# Patient Record
Sex: Female | Born: 1955 | Race: White | Hispanic: No | Smoking: Former smoker
Health system: Southern US, Community
[De-identification: ages and names within clinical notes are randomized; demographics above are authoritative.]

## PROBLEM LIST (undated history)

## (undated) DIAGNOSIS — Z9289 Personal history of other medical treatment: Secondary | ICD-10-CM

## (undated) DIAGNOSIS — Z9889 Other specified postprocedural states: Secondary | ICD-10-CM

## (undated) DIAGNOSIS — J45909 Unspecified asthma, uncomplicated: Secondary | ICD-10-CM

## (undated) DIAGNOSIS — S83509A Sprain of unspecified cruciate ligament of unspecified knee, initial encounter: Secondary | ICD-10-CM

## (undated) DIAGNOSIS — K5730 Diverticulosis of large intestine without perforation or abscess without bleeding: Secondary | ICD-10-CM

## (undated) DIAGNOSIS — Z0000 Encounter for general adult medical examination without abnormal findings: Secondary | ICD-10-CM

## (undated) DIAGNOSIS — M159 Polyosteoarthritis, unspecified: Secondary | ICD-10-CM

## (undated) DIAGNOSIS — M1990 Unspecified osteoarthritis, unspecified site: Secondary | ICD-10-CM

## (undated) DIAGNOSIS — J449 Chronic obstructive pulmonary disease, unspecified: Secondary | ICD-10-CM

## (undated) DIAGNOSIS — R51 Headache: Secondary | ICD-10-CM

## (undated) DIAGNOSIS — E039 Hypothyroidism, unspecified: Secondary | ICD-10-CM

## (undated) DIAGNOSIS — K219 Gastro-esophageal reflux disease without esophagitis: Secondary | ICD-10-CM

## (undated) DIAGNOSIS — I10 Essential (primary) hypertension: Secondary | ICD-10-CM

## (undated) DIAGNOSIS — R519 Headache, unspecified: Secondary | ICD-10-CM

## (undated) HISTORY — DX: Diverticulosis of large intestine without perforation or abscess without bleeding: K57.30

## (undated) HISTORY — PX: OTHER SURGICAL HISTORY: SHX169

## (undated) HISTORY — DX: Essential (primary) hypertension: I10

## (undated) HISTORY — PX: DIAGNOSTIC LAPAROSCOPY: SUR761

## (undated) HISTORY — PX: DILATION AND CURETTAGE OF UTERUS: SHX78

## (undated) HISTORY — PX: EXCISION MORTON'S NEUROMA: SHX5013

## (undated) HISTORY — PX: TUBAL LIGATION: SHX77

## (undated) HISTORY — PX: OTHER PROCEDURE: U1053

## (undated) HISTORY — DX: Unspecified asthma, uncomplicated: J45.909

## (undated) HISTORY — DX: Polyosteoarthritis, unspecified: M15.9

## (undated) HISTORY — DX: Sprain of unspecified cruciate ligament of unspecified knee, initial encounter: S83.509A

## (undated) HISTORY — DX: Encounter for general adult medical examination without abnormal findings: Z00.00

## (undated) HISTORY — DX: Other specified postprocedural states: Z98.890

## (undated) HISTORY — DX: Personal history of other medical treatment: Z92.89

---

## 2004-05-29 ENCOUNTER — Ambulatory Visit: Payer: Self-pay | Admitting: Orthopedic Surgery

## 2004-06-12 ENCOUNTER — Observation Stay (HOSPITAL_COMMUNITY): Admission: EM | Admit: 2004-06-12 | Discharge: 2004-06-14 | Payer: Self-pay | Admitting: *Deleted

## 2004-06-13 ENCOUNTER — Ambulatory Visit: Payer: Self-pay | Admitting: Cardiology

## 2004-06-20 ENCOUNTER — Ambulatory Visit: Payer: Self-pay | Admitting: *Deleted

## 2004-06-26 ENCOUNTER — Ambulatory Visit: Payer: Self-pay

## 2004-06-26 ENCOUNTER — Ambulatory Visit: Payer: Self-pay | Admitting: *Deleted

## 2007-08-25 ENCOUNTER — Inpatient Hospital Stay: Payer: Self-pay | Admitting: Internal Medicine

## 2007-08-25 ENCOUNTER — Other Ambulatory Visit: Payer: Self-pay

## 2009-02-14 ENCOUNTER — Ambulatory Visit: Payer: Self-pay | Admitting: Oncology

## 2009-03-07 ENCOUNTER — Ambulatory Visit: Payer: Self-pay | Admitting: Oncology

## 2009-03-14 ENCOUNTER — Ambulatory Visit: Payer: Self-pay | Admitting: Oncology

## 2009-03-29 ENCOUNTER — Ambulatory Visit: Payer: Self-pay | Admitting: Orthopedic Surgery

## 2009-04-14 ENCOUNTER — Ambulatory Visit: Payer: Self-pay | Admitting: Oncology

## 2010-07-11 DIAGNOSIS — M940 Chondrocostal junction syndrome [Tietze]: Secondary | ICD-10-CM | POA: Insufficient documentation

## 2010-07-11 DIAGNOSIS — K219 Gastro-esophageal reflux disease without esophagitis: Secondary | ICD-10-CM | POA: Insufficient documentation

## 2010-07-11 DIAGNOSIS — R0789 Other chest pain: Secondary | ICD-10-CM | POA: Insufficient documentation

## 2010-07-11 DIAGNOSIS — N951 Menopausal and female climacteric states: Secondary | ICD-10-CM | POA: Insufficient documentation

## 2010-07-11 DIAGNOSIS — J309 Allergic rhinitis, unspecified: Secondary | ICD-10-CM | POA: Insufficient documentation

## 2010-07-11 DIAGNOSIS — I739 Peripheral vascular disease, unspecified: Secondary | ICD-10-CM | POA: Insufficient documentation

## 2010-08-08 DIAGNOSIS — R946 Abnormal results of thyroid function studies: Secondary | ICD-10-CM | POA: Insufficient documentation

## 2010-09-28 DIAGNOSIS — M7650 Patellar tendinitis, unspecified knee: Secondary | ICD-10-CM | POA: Insufficient documentation

## 2010-09-28 DIAGNOSIS — M25569 Pain in unspecified knee: Secondary | ICD-10-CM | POA: Insufficient documentation

## 2010-09-28 DIAGNOSIS — R229 Localized swelling, mass and lump, unspecified: Secondary | ICD-10-CM | POA: Insufficient documentation

## 2012-01-15 HISTORY — PX: OTHER SURGICAL HISTORY: SHX169

## 2012-11-27 ENCOUNTER — Emergency Department: Payer: Self-pay | Admitting: Emergency Medicine

## 2013-01-30 ENCOUNTER — Emergency Department: Payer: Self-pay | Admitting: Emergency Medicine

## 2013-01-30 LAB — COMPREHENSIVE METABOLIC PANEL
ANION GAP: 3 — AB (ref 7–16)
AST: 38 U/L — AB (ref 15–37)
Albumin: 4 g/dL (ref 3.4–5.0)
Alkaline Phosphatase: 126 U/L — ABNORMAL HIGH
BUN: 13 mg/dL (ref 7–18)
Bilirubin,Total: 0.4 mg/dL (ref 0.2–1.0)
CALCIUM: 9.5 mg/dL (ref 8.5–10.1)
CO2: 30 mmol/L (ref 21–32)
CREATININE: 0.99 mg/dL (ref 0.60–1.30)
Chloride: 101 mmol/L (ref 98–107)
EGFR (African American): >60
Glucose: 93 mg/dL (ref 65–99)
Osmolality: 268 (ref 275–301)
Potassium: 3.8 mmol/L (ref 3.5–5.1)
SGPT (ALT): 32 U/L (ref 12–78)
SODIUM: 134 mmol/L — AB (ref 136–145)
TOTAL PROTEIN: 7.5 g/dL (ref 6.4–8.2)

## 2013-01-30 LAB — TROPONIN I: Troponin-I: <0.02 ng/mL

## 2013-01-30 LAB — AMYLASE: Amylase: 39 U/L (ref 25–115)

## 2013-01-30 LAB — CBC
HCT: 40.7 % (ref 35.0–47.0)
HGB: 14 g/dL (ref 12.0–16.0)
MCH: 32.4 pg (ref 26.0–34.0)
MCHC: 34.3 g/dL (ref 32.0–36.0)
MCV: 94 fL (ref 80–100)
Platelet: 404 10*3/uL (ref 150–440)
RBC: 4.32 10*6/uL (ref 3.80–5.20)
RDW: 13.1 % (ref 11.5–14.5)
WBC: 8.9 10*3/uL (ref 3.6–11.0)

## 2013-01-30 LAB — LIPASE, BLOOD: Lipase: 217 U/L (ref 73–393)

## 2013-01-30 LAB — CK TOTAL AND CKMB (NOT AT ARMC)
CK, TOTAL: 118 U/L (ref 21–215)
CK-MB: 1.4 ng/mL (ref 0.5–3.6)

## 2013-07-12 DIAGNOSIS — K5730 Diverticulosis of large intestine without perforation or abscess without bleeding: Secondary | ICD-10-CM

## 2013-07-12 DIAGNOSIS — E785 Hyperlipidemia, unspecified: Secondary | ICD-10-CM

## 2013-07-12 DIAGNOSIS — J45909 Unspecified asthma, uncomplicated: Secondary | ICD-10-CM

## 2013-07-12 DIAGNOSIS — M159 Polyosteoarthritis, unspecified: Secondary | ICD-10-CM

## 2013-07-22 DIAGNOSIS — N951 Menopausal and female climacteric states: Secondary | ICD-10-CM

## 2013-08-18 ENCOUNTER — Telehealth (INDEPENDENT_AMBULATORY_CARE_PROVIDER_SITE_OTHER): Payer: Self-pay

## 2013-08-18 NOTE — Telephone Encounter (Signed)
Chronic Left Knee Partial ACL tear.    Scheduled 09/01/13 (Wed) 2:30 PM [facility] Dr. Corliss MarcusVoskanian.  Please confirm with pt and remind to bring x-ray and MRI images to appt.

## 2013-08-18 NOTE — Telephone Encounter (Signed)
New patient referral received for Chronic L Knee Pain. I have entered [place] and notes have been scanned into Media. Please review and call the patient with an appointment. If you have any question please call (Physician Access) at ex [phone]. Thank You.

## 2013-08-19 NOTE — Telephone Encounter (Signed)
Rescheduled to 11/03/13 (Wed) 1:30 PM [facility] Dr. Corliss MarcusVoskanian - Per pt's request.

## 2013-08-23 DIAGNOSIS — F329 Major depressive disorder, single episode, unspecified: Secondary | ICD-10-CM

## 2013-08-23 DIAGNOSIS — S83509A Sprain of unspecified cruciate ligament of unspecified knee, initial encounter: Secondary | ICD-10-CM

## 2013-08-23 DIAGNOSIS — F32A Depression, unspecified: Secondary | ICD-10-CM | POA: Insufficient documentation

## 2013-09-01 ENCOUNTER — Encounter (INDEPENDENT_AMBULATORY_CARE_PROVIDER_SITE_OTHER): Payer: BC Managed Care – PPO | Admitting: Internal Medicine

## 2013-10-05 ENCOUNTER — Telehealth (INDEPENDENT_AMBULATORY_CARE_PROVIDER_SITE_OTHER): Payer: Self-pay | Admitting: Internal Medicine

## 2013-10-05 NOTE — Telephone Encounter (Signed)
Received VM of patient c/o UTI s/s. Call placed to patient for triage. LMOM for patient to call back.

## 2013-10-07 ENCOUNTER — Encounter (INDEPENDENT_AMBULATORY_CARE_PROVIDER_SITE_OTHER): Payer: Self-pay | Admitting: Internal Medicine

## 2013-10-07 NOTE — Interdisciplinary (Signed)
Repeat CT chest with contrast came back: Lung nodule stable, patient advised to repeat in 18 months and if stable again, okay to stop repeating it.

## 2013-10-08 NOTE — Telephone Encounter (Signed)
Patient has appointment here on Monday.

## 2013-10-10 ENCOUNTER — Encounter (INDEPENDENT_AMBULATORY_CARE_PROVIDER_SITE_OTHER): Payer: Self-pay | Admitting: Internal Medicine

## 2013-10-10 MED ORDER — TRAMADOL HCL 50 MG OR TABS
ORAL_TABLET | ORAL | Status: DC
Start: 2013-07-12 — End: 2013-12-01

## 2013-10-10 MED ORDER — ATORVASTATIN CALCIUM 20 MG OR TABS
ORAL_TABLET | ORAL | Status: AC
Start: 2013-08-29 — End: ?

## 2013-10-10 MED ORDER — HYDROCODONE-ACETAMINOPHEN 5-325 MG OR TABS
ORAL_TABLET | ORAL | Status: AC
Start: 2013-09-14 — End: ?

## 2013-10-10 MED ORDER — PAROXETINE HCL 20 MG OR TABS
ORAL_TABLET | ORAL | Status: AC
Start: 2013-08-23 — End: ?

## 2013-10-10 MED ORDER — TRAMADOL HCL 50 MG OR TABS
50.00 mg | ORAL_TABLET | Freq: Four times a day (QID) | ORAL | Status: DC | PRN
Start: 2013-09-17 — End: 2013-10-10

## 2013-10-11 ENCOUNTER — Encounter (INDEPENDENT_AMBULATORY_CARE_PROVIDER_SITE_OTHER): Payer: BC Managed Care – PPO | Admitting: Internal Medicine

## 2013-10-11 ENCOUNTER — Ambulatory Visit (INDEPENDENT_AMBULATORY_CARE_PROVIDER_SITE_OTHER): Payer: Self-pay | Admitting: Internal Medicine

## 2013-10-11 NOTE — Progress Notes (Signed)
This encounter was opened in error.  Please disregard.

## 2013-10-28 ENCOUNTER — Encounter (INDEPENDENT_AMBULATORY_CARE_PROVIDER_SITE_OTHER): Payer: Self-pay | Admitting: Internal Medicine

## 2013-10-28 DIAGNOSIS — M25569 Pain in unspecified knee: Principal | ICD-10-CM

## 2013-11-03 ENCOUNTER — Ambulatory Visit (INDEPENDENT_AMBULATORY_CARE_PROVIDER_SITE_OTHER): Payer: BC Managed Care – PPO | Admitting: Internal Medicine

## 2013-11-03 ENCOUNTER — Inpatient Hospital Stay (INDEPENDENT_AMBULATORY_CARE_PROVIDER_SITE_OTHER)
Admit: 2013-11-03 | Discharge: 2013-11-03 | Disposition: A | Discharge status: Home / Self Care | Payer: BC Managed Care – PPO

## 2013-11-03 ENCOUNTER — Encounter (INDEPENDENT_AMBULATORY_CARE_PROVIDER_SITE_OTHER): Payer: Self-pay | Admitting: Internal Medicine

## 2013-11-03 VITALS — BP 157/84 | HR 91 | Temp 98.3°F | Ht 62.0 in | Wt 165.0 lb

## 2013-11-03 DIAGNOSIS — M23307 Other meniscus derangements, unspecified meniscus, left knee: Secondary | ICD-10-CM

## 2013-11-03 DIAGNOSIS — M25562 Pain in left knee: Secondary | ICD-10-CM

## 2013-11-03 DIAGNOSIS — M2242 Chondromalacia patellae, left knee: Principal | ICD-10-CM

## 2013-11-03 DIAGNOSIS — M2352 Chronic instability of knee, left knee: Secondary | ICD-10-CM

## 2013-11-03 NOTE — Progress Notes (Signed)
SPORTS MEDICINE CONSULTATION, ENC    This patient is being seen for a Sports Medicine consultation by the request of Dr. Cyndie Carter, Brenda Carter.    CC: left knee    HPI: Brenda Carter is a 58 year old female that reports intermittent pain and swelling in left knee for years. Fell once during bowling 2477yrs ago. But otherwise no recent injuries. Swims and bowls mostly. Been having symptoms for the last 7636yrs or so that come and go. 2009/10 had a cortisone injection that didn't seem to help much or maybe it did for short period of time, not sure. Taking stemtech supplement recently seems to have resolved her current knee pain. Swimming always helps resolve her joint issues as well, but had been avoiding d/t having pna few times this last year, as well as recent right foot surgery (Dr. Cyril Carter). She points to anterior knee, posterior knee, nonspecific locations. Denies any other joint swelling other than left thumb at times. Got MRI by PMD at [facility], [facility], she didn't bring it with her, was told she has a chronic partial ACL tear.    Pain Score: 6 (when flares up pain level is a 7)    Past Medical History   Diagnosis Date    Diverticulosis of colon      on Colo 2009    Generalized osteoarthrosis, involving multiple sites      has pain in hips, coccyx, knees & left thenar eminence- takes Tramadol 1-2 times a week    Sprain and strain of cruciate ligament of knee      MRI left knee 07/15- partial tear ACL- referred to Ortho    Asthma      uses Breo; uses rescue inhaler only occasionally    History of CT scan of chest      02/15- lung nodules- 5mm LLL, 3mm right minor fissure    H/O coronary angiogram      2012- per patient- showed CAD & PAD- not mentioned in previous MD's Encounter notes & no records of this    Physical exam      07/15    H/O mammogram      2014     Past Surgical History   Procedure Laterality Date    Cataracts       X3- 2012    Tummy tuck       2014    Cesarean section, classic        X3     SOCIAL HISTORY: respiratory therapist, divorced, former smoker, 2 wine/nt, no drugs. Swim and walk.    FAMILY HISTORY:  Family History   Problem Relation Age of Onset    Cancer Brother      died of lung cancer at age 57- nonsmoker    Gastrointestinal Sister      died of Hep C at age 57    Stroke Mother      died at age 57       Meds:  Current outpatient prescriptions: atorvastatin (LIPITOR) 20 MG tablet, , Disp: , Rfl: 1;  HYDROcodone-acetaminophen (NORCO) 5-325 MG tablet, , Disp: , Rfl: 0;  PARoxetine (PAXIL) 20 MG tablet, once a day, Disp: , Rfl: ;  traMADol (ULTRAM) 50 MG tablet, once a day as needed, Disp: , Rfl:     Allergies:  Allergies   Allergen Reactions    Cafergot [Ergotamine-Caffeine] Rash     Body rash    Latex Anaphylaxis  Anaphylaxis reaction        REVIEW OF SYSTEMS: 14 point review of systems is negative other than those stated in the history and intake form    PEX:  BP 157/84 mmHg   Pulse 91   Temp(Src) 98.3 F (36.8 C) (Oral)   Ht 5\' 2"  (1.575 m)   Wt 74.844 kg (165 lb)   BMI 30.17 kg/m2  GENERAL: NAD, comfortable, WDWN  PSYCH: normal affect, alert & oriented  HEENT: atraumatic, EOMI, mucous membrane moist  SKIN: Intact, no rashes or erythema, no open wounds  MSK:  L KNEE  -Inspection: No effusion, no LE edema, no joint erythema or abnormal warmth  -Palpation: +ttp medial jointline mildly, +patellar facets;  Nontender to palpation - pes bursa, lateral jointline, patellar/quad tendon, popliteal fossa, hamstring tendons  -Provocative testing: + patellar grind, neg Mcmurray's, valgus testing neg for laxity and pain, varus testing neg for pain or laxity, +/- Lachmans, + Ant drawer w/ some endpoint, neg Post drawer  -Range of motion: FROM in knee flexion and knee extension  -Strength: 5/5 quads, 5/5 hamstrings, extensor mechanism is intact  -Neurolgoic: Sensation intact to touch lower extremity  -Vascular: Distal pulses intact including DP & PT, skin well perfused  -No  lymphadenopathy, no lymphedema    IMAGING was personally reviewed and interpreted by myself:   - X-Rays films of the knee include weight-bearing merchant, AP, and lateral views. There is normal anatomic alignment, no joint space narrowing, no evidence of fracture, and overall unremarkable per my read.  -MRI left knee [facility] 7.29.15 shows old chronic partial ACL tear, report is o/w neg. Per my read subtle signal in medial meniscus might be subtle tear, also some low grade patellar chondral fissuring, and mild medial/lateral chondral thinning.    ASSESSMENT AND PLAN: [name] with reported h/o intermittent chronic left knee swelling and pain. Her pain is mostly around the kneecap region, some tenderness medial jointline today. She has no swelling, no effusion at this time. Her MRI from [facility] shows and old chronic partial ACL tear that is asymptmatic and wouldn't cause any of these symptoms; no intervention indicated for that at this time. There is trace signal in medial meniscus per my read though not read as full tear. There is some mild patellar chondral irregularity, very mild thinning medial/lateral compartments. 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     At this point her symptoms are resolved. We discussed option for PT, knee bracing for support, consideration of cortisone injection. She declined, stating once she can get back to swimming her symptoms always resolve.    Followup as needed. Return to clinic if symptoms worsen or don't improve.    In future we ask that patient's who get MRI's done outside of [facility] to please bring a copy of their images on CD along w/ aprinted MRI report to their appointment. We don't have access to outside images.      Diagnosis and treatment options were discussed in detail with the patient who is in agreement with the plan. All questions were answered. Thank you very much for this consultation. Please let me know if you have any questions.

## 2013-12-01 ENCOUNTER — Other Ambulatory Visit (INDEPENDENT_AMBULATORY_CARE_PROVIDER_SITE_OTHER): Payer: Self-pay | Admitting: Internal Medicine

## 2013-12-01 DIAGNOSIS — M159 Polyosteoarthritis, unspecified: Principal | ICD-10-CM

## 2013-12-01 MED ORDER — TRAMADOL HCL 50 MG OR TABS
50.0000 mg | ORAL_TABLET | Freq: Every day | ORAL | Status: AC
Start: 2013-12-01 — End: ?

## 2013-12-01 NOTE — Telephone Encounter (Signed)
No further RFs until seen- last seen 08/23/13- no showed for last appt

## 2013-12-01 NOTE — Telephone Encounter (Signed)
Refill request came in via Fax.

## 2013-12-02 NOTE — Telephone Encounter (Signed)
Advised [facility] and left message with patient.

## 2015-01-20 ENCOUNTER — Ambulatory Visit
Admission: RE | Admit: 2015-01-20 | Discharge: 2015-01-20 | Disposition: A | Discharge status: Home / Self Care | Payer: BLUE CROSS/BLUE SHIELD | Source: Ambulatory Visit | Attending: Internal Medicine | Admitting: Internal Medicine

## 2015-01-20 ENCOUNTER — Other Ambulatory Visit: Payer: Self-pay | Admitting: Internal Medicine

## 2015-01-20 DIAGNOSIS — R05 Cough: Secondary | ICD-10-CM

## 2015-01-20 DIAGNOSIS — R059 Cough, unspecified: Secondary | ICD-10-CM

## 2015-03-31 ENCOUNTER — Telehealth (INDEPENDENT_AMBULATORY_CARE_PROVIDER_SITE_OTHER): Payer: Self-pay | Admitting: Internal Medicine

## 2015-03-31 NOTE — Telephone Encounter (Signed)
A reminder pop up came on my task for pt to repeat his Chest CT done 09/2013 ( pls review records in Media ).  Patient last seen in 2015.  pls review.

## 2015-03-31 NOTE — Telephone Encounter (Signed)
lmom asking pt to plz call and let us know if she is under care of another physician and if that doc knows she is needing to do a repeat chest ct or if she still is under carte of dr Cyndie Chimenguyen. If pt is under care of us and is wanting this ct she needs to make an appt to see dr Cyndie Chimenguyen. kimg

## 2015-03-31 NOTE — Telephone Encounter (Signed)
Last seen 08/15- if wants to get f/u chest CT, needs appt (I don't know if she's even my patient any more)

## 2015-07-25 DIAGNOSIS — M19049 Primary osteoarthritis, unspecified hand: Secondary | ICD-10-CM | POA: Insufficient documentation

## 2015-07-25 DIAGNOSIS — M1250 Traumatic arthropathy, unspecified site: Secondary | ICD-10-CM | POA: Insufficient documentation

## 2015-08-02 ENCOUNTER — Other Ambulatory Visit: Payer: Self-pay | Admitting: Specialist

## 2015-08-09 ENCOUNTER — Other Ambulatory Visit: Payer: BLUE CROSS/BLUE SHIELD

## 2015-08-10 ENCOUNTER — Encounter: Payer: Self-pay | Admitting: *Deleted

## 2015-08-10 ENCOUNTER — Encounter
Admission: RE | Admit: 2015-08-10 | Discharge: 2015-08-10 | Disposition: A | Discharge status: Home / Self Care | Payer: BLUE CROSS/BLUE SHIELD | Source: Ambulatory Visit | Attending: Specialist | Admitting: Specialist

## 2015-08-10 NOTE — Patient Instructions (Signed)
  Your procedure is scheduled on: 08-21-15  Report to Same Day Surgery 2nd floor medical mall To find out your arrival time please call [phone] between 1PM - 3PM on 08-18-15   Remember: Instructions that are not followed completely may result in serious medical risk, up to and including death, or upon the discretion of your surgeon and anesthesiologist your surgery may need to be rescheduled.    _x___ 1. Do not eat food or drink liquids after midnight. No gum chewing or hard candies.     __x__ 2. No Alcohol for 24 hours before or after surgery.   __x__3. No Smoking for 24 prior to surgery.   ____  4. Bring all medications with you on the day of surgery if instructed.    __x__ 5. Notify your doctor if there is any change in your medical condition     (cold, fever, infections).     Do not wear jewelry, make-up, hairpins, clips or nail polish.  Do not wear lotions, powders, or perfumes. You may wear deodorant.  Do not shave 48 hours prior to surgery. Men may shave face and neck.  Do not bring valuables to the hospital.    [facility] is not responsible for any belongings or valuables.               Contacts, dentures or bridgework may not be worn into surgery.  Leave your suitcase in the car. After surgery it may be brought to your room.  For patients admitted to the hospital, discharge time is determined by your treatment team.   Patients discharged the day of surgery will not be allowed to drive home.    Please read over the following fact sheets that you were given:   [facility] Preparing for Surgery and or MRSA Information   _x___ Take these medicines the morning of surgery with A SIP OF WATER:    1. THYROID PILL  2. ZYRTEC  3. OMEPRAZOLE  4. TAKE AN EXTRA OMEPRAZOLE ON Sunday NIGHT BEFORE BED  5.  6.  ____ Fleet Enema (as directed)   ____ Use CHG Soap or sage wipes as directed on instruction sheet   _X___ Use inhalers on the day of surgery and bring to hospital day  of surgery  ____ Stop metformin 2 days prior to surgery    ____ Take 1/2 of usual insulin dose the night before surgery and none on the morning of surgery.   ____ Stop aspirin or coumadin, or plavix  _x__ Stop Anti-inflammatories such as Advil, Aleve, Ibuprofen, Motrin, Naproxen,          Naprosyn, Goodies powders or aspirin product AND MOBIC 7 DAYS PRIOR TO SURGERY. Ok to take Tylenol OR TRAMADOL   _X___ Stop supplements until after surgery-STOP BIOTIN, BLACK COHOSH, PUMPKIN SEED OIL, PAPAYA EXTRACT AND MILK THISTLE 7 DAYS PRIOR  ____ Bring C-Pap to the hospital.

## 2015-08-21 ENCOUNTER — Encounter: Payer: Self-pay | Admitting: *Deleted

## 2015-08-21 ENCOUNTER — Ambulatory Visit
Admission: RE | Admit: 2015-08-21 | Discharge: 2015-08-21 | Disposition: A | Discharge status: Home / Self Care | Payer: BLUE CROSS/BLUE SHIELD | Source: Ambulatory Visit | Attending: Specialist | Admitting: Specialist

## 2015-08-21 ENCOUNTER — Ambulatory Visit: Payer: BLUE CROSS/BLUE SHIELD | Admitting: Anesthesiology

## 2015-08-21 ENCOUNTER — Encounter
Admission: RE | Disposition: A | Discharge status: Home / Self Care | Payer: Self-pay | Source: Ambulatory Visit | Attending: Specialist

## 2015-08-21 DIAGNOSIS — K219 Gastro-esophageal reflux disease without esophagitis: Secondary | ICD-10-CM | POA: Insufficient documentation

## 2015-08-21 DIAGNOSIS — Z91040 Latex allergy status: Secondary | ICD-10-CM | POA: Diagnosis not present

## 2015-08-21 DIAGNOSIS — E039 Hypothyroidism, unspecified: Secondary | ICD-10-CM | POA: Insufficient documentation

## 2015-08-21 DIAGNOSIS — Z9889 Other specified postprocedural states: Secondary | ICD-10-CM | POA: Diagnosis not present

## 2015-08-21 DIAGNOSIS — Z87891 Personal history of nicotine dependence: Secondary | ICD-10-CM | POA: Diagnosis not present

## 2015-08-21 DIAGNOSIS — M797 Fibromyalgia: Secondary | ICD-10-CM | POA: Diagnosis not present

## 2015-08-21 DIAGNOSIS — M1812 Unilateral primary osteoarthritis of first carpometacarpal joint, left hand: Secondary | ICD-10-CM | POA: Diagnosis not present

## 2015-08-21 DIAGNOSIS — E7800 Pure hypercholesterolemia, unspecified: Secondary | ICD-10-CM | POA: Insufficient documentation

## 2015-08-21 DIAGNOSIS — Z79899 Other long term (current) drug therapy: Secondary | ICD-10-CM | POA: Diagnosis not present

## 2015-08-21 HISTORY — PX: FINGER ARTHROPLASTY: SHX5017

## 2015-08-21 HISTORY — DX: Unspecified osteoarthritis, unspecified site: M19.90

## 2015-08-21 HISTORY — DX: Unspecified asthma, uncomplicated: J45.909

## 2015-08-21 HISTORY — DX: Headache, unspecified: R51.9

## 2015-08-21 HISTORY — DX: Hypothyroidism, unspecified: E03.9

## 2015-08-21 HISTORY — DX: Gastro-esophageal reflux disease without esophagitis: K21.9

## 2015-08-21 HISTORY — DX: Headache: R51

## 2015-08-21 SURGERY — ARTHROPLASTY, FINGER
Anesthesia: General | Site: Thumb | Laterality: Left | Wound class: Clean

## 2015-08-21 MED ORDER — OXYCODONE HCL 5 MG PO TABS
5.0000 mg | ORAL_TABLET | Freq: Once | ORAL | Status: AC | PRN
  Administered 2015-08-21: 5 mg via ORAL

## 2015-08-21 MED ORDER — OXYCODONE HCL 5 MG/5ML PO SOLN: 5.0000 mg | Freq: Once | ORAL | Status: AC | PRN

## 2015-08-21 MED ORDER — FENTANYL CITRATE (PF) 100 MCG/2ML IJ SOLN
25.0000 ug | INTRAMUSCULAR | Status: AC | PRN
  Administered 2015-08-21 (×6): 25 ug via INTRAVENOUS

## 2015-08-21 MED ORDER — LIDOCAINE HCL (CARDIAC) 20 MG/ML IV SOLN
INTRAVENOUS | Status: DC | PRN
  Administered 2015-08-21: 80 mg via INTRAVENOUS

## 2015-08-21 MED ORDER — FENTANYL CITRATE (PF) 100 MCG/2ML IJ SOLN
INTRAMUSCULAR | Status: DC | PRN
  Administered 2015-08-21 (×2): 50 ug via INTRAVENOUS

## 2015-08-21 MED ORDER — FENTANYL CITRATE (PF) 100 MCG/2ML IJ SOLN
INTRAMUSCULAR | Status: AC
  Administered 2015-08-21: 25 ug via INTRAVENOUS
  Filled 2015-08-21: qty 2

## 2015-08-21 MED ORDER — GABAPENTIN 400 MG PO CAPS: 400.0000 mg | ORAL_CAPSULE | Freq: Three times a day (TID) | ORAL | 3 refills | Status: DC

## 2015-08-21 MED ORDER — MELOXICAM 7.5 MG PO TABS
ORAL_TABLET | ORAL | Status: AC
  Filled 2015-08-21: qty 2

## 2015-08-21 MED ORDER — HYDROCODONE-ACETAMINOPHEN 5-325 MG PO TABS: 1.0000 | ORAL_TABLET | Freq: Four times a day (QID) | ORAL | 0 refills | Status: DC | PRN

## 2015-08-21 MED ORDER — ROCURONIUM BROMIDE 100 MG/10ML IV SOLN
INTRAVENOUS | Status: DC | PRN
  Administered 2015-08-21: 25 mg via INTRAVENOUS

## 2015-08-21 MED ORDER — MELOXICAM 15 MG PO TABS: 15.0000 mg | ORAL_TABLET | Freq: Every day | ORAL | 3 refills | Status: DC

## 2015-08-21 MED ORDER — KETOROLAC TROMETHAMINE 30 MG/ML IJ SOLN
INTRAMUSCULAR | Status: DC | PRN
  Administered 2015-08-21: 30 mg via INTRAVENOUS

## 2015-08-21 MED ORDER — NEOMYCIN-POLYMYXIN B GU 40-200000 IR SOLN
Status: DC | PRN
  Administered 2015-08-21: 2 mL

## 2015-08-21 MED ORDER — GABAPENTIN 300 MG PO CAPS
300.0000 mg | ORAL_CAPSULE | ORAL | Status: AC
  Administered 2015-08-21: 300 mg via ORAL

## 2015-08-21 MED ORDER — LACTATED RINGERS IV SOLN
INTRAVENOUS | Status: DC
  Administered 2015-08-21 (×2): via INTRAVENOUS

## 2015-08-21 MED ORDER — CEFAZOLIN SODIUM-DEXTROSE 2-4 GM/100ML-% IV SOLN
2.0000 g | INTRAVENOUS | Status: AC
  Administered 2015-08-21: 2 g via INTRAVENOUS

## 2015-08-21 MED ORDER — PROPOFOL 10 MG/ML IV BOLUS
INTRAVENOUS | Status: DC | PRN
  Administered 2015-08-21: 50 mg via INTRAVENOUS
  Administered 2015-08-21: 150 mg via INTRAVENOUS

## 2015-08-21 MED ORDER — CHLORHEXIDINE GLUCONATE CLOTH 2 % EX PADS: 6.0000 | MEDICATED_PAD | Freq: Once | CUTANEOUS | Status: DC

## 2015-08-21 MED ORDER — NEOMYCIN-POLYMYXIN B GU 40-200000 IR SOLN
Status: AC
  Filled 2015-08-21: qty 2

## 2015-08-21 MED ORDER — SUGAMMADEX SODIUM 200 MG/2ML IV SOLN
INTRAVENOUS | Status: DC | PRN
  Administered 2015-08-21: 149.6 mg via INTRAVENOUS

## 2015-08-21 MED ORDER — GABAPENTIN 300 MG PO CAPS
ORAL_CAPSULE | ORAL | Status: AC
  Filled 2015-08-21: qty 1

## 2015-08-21 MED ORDER — SUCCINYLCHOLINE CHLORIDE 20 MG/ML IJ SOLN
INTRAMUSCULAR | Status: DC | PRN
  Administered 2015-08-21: 100 mg via INTRAVENOUS

## 2015-08-21 MED ORDER — MELOXICAM 7.5 MG PO TABS
15.0000 mg | ORAL_TABLET | Freq: Once | ORAL | Status: AC
  Administered 2015-08-21: 15 mg via ORAL

## 2015-08-21 MED ORDER — MIDAZOLAM HCL 2 MG/2ML IJ SOLN
INTRAMUSCULAR | Status: DC | PRN
  Administered 2015-08-21: 2 mg via INTRAVENOUS

## 2015-08-21 MED ORDER — BUPIVACAINE HCL (PF) 0.5 % IJ SOLN
INTRAMUSCULAR | Status: DC | PRN
  Administered 2015-08-21: 25 mL

## 2015-08-21 MED ORDER — BUPIVACAINE HCL (PF) 0.5 % IJ SOLN
INTRAMUSCULAR | Status: AC
  Filled 2015-08-21: qty 30

## 2015-08-21 MED ORDER — DEXAMETHASONE SODIUM PHOSPHATE 10 MG/ML IJ SOLN
INTRAMUSCULAR | Status: DC | PRN
  Administered 2015-08-21: 10 mg via INTRAVENOUS

## 2015-08-21 MED ORDER — CLINDAMYCIN PHOSPHATE 600 MG/50ML IV SOLN
600.0000 mg | Freq: Three times a day (TID) | INTRAVENOUS | Status: DC
  Administered 2015-08-21: 600 mg via INTRAVENOUS

## 2015-08-21 MED ORDER — CEFAZOLIN SODIUM-DEXTROSE 2-4 GM/100ML-% IV SOLN
INTRAVENOUS | Status: AC
  Filled 2015-08-21: qty 100

## 2015-08-21 MED ORDER — ONDANSETRON HCL 4 MG/2ML IJ SOLN
INTRAMUSCULAR | Status: DC | PRN
  Administered 2015-08-21: 4 mg via INTRAVENOUS

## 2015-08-21 MED ORDER — OXYCODONE HCL 5 MG PO TABS
ORAL_TABLET | ORAL | Status: AC
  Filled 2015-08-21: qty 1

## 2015-08-21 MED ORDER — CLINDAMYCIN PHOSPHATE 600 MG/50ML IV SOLN
INTRAVENOUS | Status: AC
  Filled 2015-08-21: qty 50

## 2015-08-21 SURGICAL SUPPLY — 46 items
BLADE OSC/SAGITTAL MD 5.5X18 (BLADE) ×2 IMPLANT
BLADE SURG MINI STRL (BLADE) ×2 IMPLANT
BNDG ESMARK 4X12 TAN STRL LF (GAUZE/BANDAGES/DRESSINGS) ×2 IMPLANT
CANISTER SUCT 1200ML W/VALVE (MISCELLANEOUS) ×2 IMPLANT
CHLORAPREP W/TINT 26ML (MISCELLANEOUS) ×2 IMPLANT
CUFF TOURN 18 STER (MISCELLANEOUS) ×2 IMPLANT
DECANTER SPIKE VIAL GLASS SM (MISCELLANEOUS) ×2 IMPLANT
DRAPE FLUOR MINI C-ARM 54X84 (DRAPES) ×2 IMPLANT
ELECT REM PT RETURN 9FT ADLT (ELECTROSURGICAL) ×2
ELECTRODE REM PT RTRN 9FT ADLT (ELECTROSURGICAL) ×1 IMPLANT
GAUZE FLUFF 18X24 1PLY STRL (GAUZE/BANDAGES/DRESSINGS) ×6 IMPLANT
GAUZE PETRO XEROFOAM 1X8 (MISCELLANEOUS) ×2 IMPLANT
GLOVE BIO SURGEON STRL SZ7.5 (GLOVE) ×2 IMPLANT
GLOVE BIO SURGEON STRL SZ8 (GLOVE) ×2 IMPLANT
GOWN STRL REUS W/ TWL LRG LVL3 (GOWN DISPOSABLE) ×2 IMPLANT
GOWN STRL REUS W/TWL LRG LVL3 (GOWN DISPOSABLE) ×4
KIT RM TURNOVER STRD PROC AR (KITS) ×2 IMPLANT
LOOP RED MAXI  1X406MM (MISCELLANEOUS) ×1
LOOP VESSEL MAXI 1X406 RED (MISCELLANEOUS) ×1 IMPLANT
LOOP VESSEL MINI 0.8X406 BLUE (MISCELLANEOUS) ×1 IMPLANT
LOOPS BLUE MINI 0.8X406MM (MISCELLANEOUS) ×1
NEEDLE FILTER BLUNT 18X 1/2SAF (NEEDLE) ×1
NEEDLE FILTER BLUNT 18X1 1/2 (NEEDLE) ×1 IMPLANT
NS IRRIG 500ML POUR BTL (IV SOLUTION) ×2 IMPLANT
PACK EXTREMITY ARMC (MISCELLANEOUS) ×2 IMPLANT
PAD CAST CTTN 4X4 STRL (SOFTGOODS) ×1 IMPLANT
PADDING CAST COTTON 4X4 STRL (SOFTGOODS) ×2
PASSER SUT SWANSON 36MM LOOP (INSTRUMENTS) ×2 IMPLANT
SPLINT CAST 1 STEP 3X12 (MISCELLANEOUS) ×2 IMPLANT
SPONGE LAP 18X18 5 PK (GAUZE/BANDAGES/DRESSINGS) ×2 IMPLANT
STOCKINETTE BIAS CUT 4 980044 (GAUZE/BANDAGES/DRESSINGS) ×2 IMPLANT
STOCKINETTE STRL 4IN 9604848 (GAUZE/BANDAGES/DRESSINGS) ×2 IMPLANT
SUT ETHILON 4-0 (SUTURE) ×2
SUT ETHILON 4-0 FS2 18XMFL BLK (SUTURE) ×1
SUT ETHILON 5-0 FS-2 18 BLK (SUTURE) ×2 IMPLANT
SUT VIC AB 2-0 SH 27 (SUTURE) ×2
SUT VIC AB 2-0 SH 27XBRD (SUTURE) ×1 IMPLANT
SUT VIC AB 3-0 SH 27 (SUTURE) ×2
SUT VIC AB 3-0 SH 27X BRD (SUTURE) ×1 IMPLANT
SUT VIC AB 4-0 FS2 27 (SUTURE) ×2 IMPLANT
SUT VIC AB 4-0 SH 27 (SUTURE) ×2
SUT VIC AB 4-0 SH 27XANBCTRL (SUTURE) ×1 IMPLANT
SUTURE ETHLN 4-0 FS2 18XMF BLK (SUTURE) ×1 IMPLANT
SYR 30ML LL (SYRINGE) ×2 IMPLANT
SYR 5ML LL (SYRINGE) ×2 IMPLANT
WIRE Z .062 C-WIRE SPADE TIP (WIRE) ×2 IMPLANT

## 2015-08-21 NOTE — Anesthesia Preprocedure Evaluation (Signed)
Anesthesia Evaluation  Patient identified by MRN, date of birth, ID band Patient awake    Reviewed: Allergy & Precautions, H&P , NPO status , Patient's Chart, lab work & pertinent test results  History of Anesthesia Complications Negative for: history of anesthetic complications  Airway Mallampati: III  TM Distance: >3 FB Neck ROM: limited    Dental  (+) Poor Dentition, Chipped   Pulmonary asthma , former smoker,    Pulmonary exam normal breath sounds clear to auscultation       Cardiovascular Exercise Tolerance: Good (-) angina(-) Past MI Normal cardiovascular exam Rhythm:regular Rate:Normal     Neuro/Psych  Headaches, negative psych ROS   GI/Hepatic Neg liver ROS, GERD  Controlled,  Endo/Other  Hypothyroidism   Renal/GU negative Renal ROS  negative genitourinary   Musculoskeletal  (+) Arthritis ,   Abdominal   Peds  Hematology negative hematology ROS (+)   Anesthesia Other Findings Past Medical History: No date: Arthritis No date: Asthma No date: GERD (gastroesophageal reflux disease) No date: Headache     Comment: H/O MIGRAINES No date: Hypothyroidism  Past Surgical History: No date: CESAREAN SECTION     Comment: X3 No date: DIAGNOSTIC LAPAROSCOPY No date: DILATION AND CURETTAGE OF UTERUS No date: EXCISION MORTON'S NEUROMA Right No date: TUBAL LIGATION 2014: TUMMY TUCK  BMI    Body Mass Index:  30.18 kg/m      Reproductive/Obstetrics negative OB ROS                             Anesthesia Physical Anesthesia Plan  ASA: III  Anesthesia Plan: General LMA   Post-op Pain Management:    Induction:   Airway Management Planned:   Additional Equipment:   Intra-op Plan:   Post-operative Plan:   Informed Consent: I have reviewed the patients History and Physical, chart, labs and discussed the procedure including the risks, benefits and alternatives for the proposed  anesthesia with the patient or authorized representative who has indicated his/her understanding and acceptance.   Dental Advisory Given  Plan Discussed with: Anesthesiologist, CRNA and Surgeon  Anesthesia Plan Comments:         Anesthesia Quick Evaluation

## 2015-08-21 NOTE — Anesthesia Procedure Notes (Signed)
Procedure Name: LMA Insertion Date/Time: 08/21/2015 2:33 PM Performed by: Pearla DubonnetHUANG, Kemarion Abbey Pre-anesthesia Checklist: Patient identified, Patient being monitored, Timeout performed, Emergency Drugs available and Suction available Patient Re-evaluated:Patient Re-evaluated prior to inductionOxygen Delivery Method: Circle system utilized Preoxygenation: Pre-oxygenation with 100% oxygen Intubation Type: IV induction Ventilation: Mask ventilation without difficulty LMA: LMA inserted LMA Size: 3.0 Tube type: Oral Number of attempts: 1 Placement Confirmation: positive ETCO2 and breath sounds checked- equal and bilateral Tube secured with: Tape Dental Injury: Teeth and Oropharynx as per pre-operative assessment

## 2015-08-21 NOTE — H&P (Signed)
THE PATIENT WAS SEEN PRIOR TO SURGERY TODAY.  HISTORY, ALLERGIES, HOME MEDICATIONS AND OPERATIVE PROCEDURE WERE REVIEWED. RISKS AND BENEFITS OF SURGERY DISCUSSED WITH PATIENT AGAIN.  NO CHANGES FROM INITIAL HISTORY AND PHYSICAL NOTED.    

## 2015-08-21 NOTE — Transfer of Care (Signed)
Immediate Anesthesia Transfer of Care Note  Patient: Evelyn Simon  Procedure(s) Performed: Procedure(s): LEFT THUMB  ARTHROPLASTY (CMC) (Left)  Patient Location: PACU  Anesthesia Type:General  Level of Consciousness: sedated  Airway & Oxygen Therapy: Patient Spontanous Breathing and Patient connected to face mask oxygen  Post-op Assessment: Report given to RN and Post -op Vital signs reviewed and stable  Post vital signs: Reviewed and stable  Last Vitals:  Vitals:   08/21/15 1333  BP: (!) 151/88  Pulse: 87  Resp: 18  Temp: 36.8 C    Last Pain:  Vitals:   08/21/15 1333  TempSrc: Oral         Complications: No apparent anesthesia complications

## 2015-08-21 NOTE — Op Note (Signed)
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    The risks benefits and alternatives were discussed with the patient preoperatively including but not limited to the risks of infection, bleeding, nerve injury, cardiopulmonary complications, the need for revision surgery, among others, and the patient was willing to proceed.  EBL: None  TOURNIQUET TIME: 83 min  OPERATIVE IMPLANTS: None  OPERATIVE FINDINGS: Advanced arthritis left  thumb cmc joint  OPERATIVE PROCEDURE: The patient was brought to the operating room and underwent satisfactory IV regional anesthesia in the supine position. The operative arm was prepped and draped in a sterile fashion. The patient was noted to have no palmaris longus tendon so the flexor carpi radialis was split longitudinally to provide graft material. 3 short transverse incisions were made over the course of the tendon and it was harvested under direct vision. It was then placed in a moist sponge on the back table. An S-shaped incision was then made over the base of the thumb metacarpal dorsally. Dissection was carried out carefully under loupe magnification, carefully sparing the sensory nerves. The tendon sheath around the abductor pollicis longus and extensor pollicis brevis was released under direct vision, including over the radial styloid. Nerves were carefully spared. Retractors were inserted and the capsule of the trapezium and metacarpal were opened longitudinally. The radial artery and veins were  carefully dissected free and retracted with a vessel loop drain. The capsule was dissected off the trapezium and the trapezium was then morselized with the oscillating saw and rongeur. The flexor carpi radialis tendon was freed up in the base of the wound. The palmaris longus tendon was passed beneath this. A 3.5 mm drill was used to create a tunnel through the base of the metacarpal. The tendon was then passed up through the metacarpal using a tendon passer. The thumb had been placed in traction which was then released. The tendon was tied upon itself and the knot was sutured to itself prevent slippage. It was then tied in multiple knots and sutured into a ball. This was then rotated into the space between the metacarpal and the scaphoid. The capsule was then carefully and thoroughly, closed with 4-0 Ethibond suture. After irrigation, the skin was closed with 4-0 nylon. The forearm wounds had been closed with a similar suture. A well-padded thumb spica splint was applied. Tourniquet was deflated with good return of blood flow to the hand. Sponge and needle counts were correct. Patient was taken to recovery in good condition.  Valinda HoarHoward E Masiyah Engen, MD

## 2015-08-21 NOTE — Anesthesia Postprocedure Evaluation (Signed)
Anesthesia Post Note  Patient: Evelyn Simon  Procedure(s) Performed: Procedure(s) (LRB): LEFT THUMB  ARTHROPLASTY (CMC) (Left)  Patient location during evaluation: PACU Anesthesia Type: General Level of consciousness: awake and alert and oriented Pain management: pain level controlled Vital Signs Assessment: post-procedure vital signs reviewed and stable Respiratory status: spontaneous breathing, nonlabored ventilation and respiratory function stable Cardiovascular status: blood pressure returned to baseline and stable Postop Assessment: no signs of nausea or vomiting Anesthetic complications: no    Last Vitals:  Vitals:   08/21/15 1649 08/21/15 1653  BP:  (!) 159/84  Pulse: 81 79  Resp: 14 12  Temp:      Last Pain:  Vitals:   08/21/15 1653  TempSrc:   PainSc: 7                  Lizzett Nobile

## 2015-08-21 NOTE — Anesthesia Procedure Notes (Signed)
Procedure Name: Intubation Date/Time: 08/21/2015 3:07 PM Performed by: Rosaria FerriesPISCITELLO, JOSEPH K Pre-anesthesia Checklist: Patient identified, Patient being monitored, Timeout performed, Emergency Drugs available and Suction available Patient Re-evaluated:Patient Re-evaluated prior to inductionOxygen Delivery Method: Circle system utilized Preoxygenation: Pre-oxygenation with 100% oxygen Intubation Type: IV induction Ventilation: Mask ventilation without difficulty Laryngoscope Size: Mac and 3 Grade View: Grade I Tube type: Oral Tube size: 7.0 mm Number of attempts: 1 Airway Equipment and Method: Stylet Placement Confirmation: ETT inserted through vocal cords under direct vision,  positive ETCO2 and breath sounds checked- equal and bilateral Secured at: 21 cm Tube secured with: Tape Dental Injury: Teeth and Oropharynx as per pre-operative assessment

## 2015-08-22 ENCOUNTER — Encounter: Payer: Self-pay | Admitting: Specialist

## 2015-08-28 ENCOUNTER — Other Ambulatory Visit: Payer: Self-pay | Admitting: Internal Medicine

## 2015-08-28 DIAGNOSIS — R911 Solitary pulmonary nodule: Secondary | ICD-10-CM

## 2015-09-01 ENCOUNTER — Ambulatory Visit
Admission: RE | Admit: 2015-09-01 | Discharge: 2015-09-01 | Disposition: A | Discharge status: Home / Self Care | Payer: BLUE CROSS/BLUE SHIELD | Source: Ambulatory Visit | Attending: Internal Medicine | Admitting: Internal Medicine

## 2015-09-01 ENCOUNTER — Ambulatory Visit: Admission: RE | Admit: 2015-09-01 | Payer: BLUE CROSS/BLUE SHIELD | Source: Ambulatory Visit

## 2015-09-01 DIAGNOSIS — R911 Solitary pulmonary nodule: Secondary | ICD-10-CM | POA: Insufficient documentation

## 2015-09-01 MED ORDER — IOPAMIDOL (ISOVUE-300) INJECTION 61%
75.0000 mL | Freq: Once | INTRAVENOUS | Status: AC | PRN
  Administered 2015-09-01: 75 mL via INTRAVENOUS

## 2015-10-20 ENCOUNTER — Other Ambulatory Visit: Payer: Self-pay | Admitting: Internal Medicine

## 2015-10-20 DIAGNOSIS — R102 Pelvic and perineal pain: Secondary | ICD-10-CM

## 2015-10-23 ENCOUNTER — Telehealth: Payer: Self-pay | Admitting: Gastroenterology

## 2015-10-23 ENCOUNTER — Ambulatory Visit
Admission: RE | Admit: 2015-10-23 | Discharge: 2015-10-23 | Disposition: A | Discharge status: Home / Self Care | Payer: BLUE CROSS/BLUE SHIELD | Source: Ambulatory Visit | Attending: Internal Medicine | Admitting: Internal Medicine

## 2015-10-23 NOTE — Telephone Encounter (Signed)
Left voice message with patient to call and schedule appointment for GERD with Dr. Servando SnareWohl. Referred by Dr. Dario GuardianJadali

## 2015-10-24 NOTE — Telephone Encounter (Signed)
Left voice message for patient to call and schedule appointment with Dr. Servando SnareWohl for GERD. Referred by Dr Dario GuardianJadali

## 2015-10-26 ENCOUNTER — Encounter: Payer: Self-pay | Admitting: Gastroenterology

## 2015-10-26 NOTE — Telephone Encounter (Signed)
Left voice message for patient to call and schedule appointment with Dr. Servando SnareWohl for GERD. Sent letter

## 2015-11-16 DIAGNOSIS — M720 Palmar fascial fibromatosis [Dupuytren]: Secondary | ICD-10-CM | POA: Insufficient documentation

## 2015-12-18 ENCOUNTER — Other Ambulatory Visit: Payer: Self-pay

## 2015-12-19 ENCOUNTER — Ambulatory Visit (INDEPENDENT_AMBULATORY_CARE_PROVIDER_SITE_OTHER): Payer: BLUE CROSS/BLUE SHIELD | Admitting: Gastroenterology

## 2015-12-19 ENCOUNTER — Other Ambulatory Visit
Admission: RE | Admit: 2015-12-19 | Discharge: 2015-12-19 | Disposition: A | Discharge status: Home / Self Care | Payer: BLUE CROSS/BLUE SHIELD | Source: Ambulatory Visit | Attending: Gastroenterology | Admitting: Gastroenterology

## 2015-12-19 ENCOUNTER — Other Ambulatory Visit: Payer: Self-pay

## 2015-12-19 ENCOUNTER — Encounter: Payer: Self-pay | Admitting: Gastroenterology

## 2015-12-19 VITALS — BP 168/80 | HR 77 | Temp 98.2°F | Ht 62.0 in | Wt 167.0 lb

## 2015-12-19 DIAGNOSIS — R140 Abdominal distension (gaseous): Secondary | ICD-10-CM | POA: Insufficient documentation

## 2015-12-19 DIAGNOSIS — R194 Change in bowel habit: Secondary | ICD-10-CM | POA: Insufficient documentation

## 2015-12-19 LAB — TSH: TSH: 3.797 u[IU]/mL (ref 0.350–4.500)

## 2015-12-19 MED ORDER — DOXYCYCLINE HYCLATE 100 MG PO CAPS: 100.0000 mg | ORAL_CAPSULE | Freq: Two times a day (BID) | ORAL | 0 refills | Status: AC

## 2015-12-19 NOTE — Progress Notes (Signed)
Gastroenterology Consultation  Referring Provider:     Sherrie MustacheJadali, Fayegh Primary Care Physician:  Sherrie MustacheFayegh Jadali Primary Gastroenterologist:  Dr. Wyline MoodKiran Zyana Amaro  Reason for Consultation:     GERD        HPI:   Evelyn RankinsRobin Simon is a 60 y.o. y/o female referred for consultation & management  by Dr. Sherrie MustacheFayegh Jadali.    Abdominal pain: Onset: atleats 1 year, it was initially daily, started on omeprazole, helped, later it stopped working, changed to dexilant , abdominal pain has resolved  Site :Epigastrium Radiation: none  Nature of pain: dull in nature Aggravating factors: eating  Relieving factors :dexilant  Weight loss: none  NSAID use: none , stopped some NSAID which she used to take PPI use :yes Gall bladder surgery: no Frequency of bowel movements: several times a day  Change in bowel movements: last 6 months - more frequent  Relief with bowel movements: sometims  Gas/Bloating/Abdominal distension: yes  Feels she "looks pregnant at times", she quit drinking which did not help. No sodas, no crystralite, occasionally consumes chewing , she has had a c section . Foul smelling when she passes gas. Stool smells a lot too , sticky and hard to flush .   She has had her last colonoscopy 10 years back, no EGD.        Past Medical History:  Diagnosis Date  . Arthritis   . Asthma   . GERD (gastroesophageal reflux disease)   . Headache    H/O MIGRAINES  . Hypothyroidism     Past Surgical History:  Procedure Laterality Date  . CESAREAN SECTION     X3  . DIAGNOSTIC LAPAROSCOPY    . DILATION AND CURETTAGE OF UTERUS    . EXCISION MORTON'S NEUROMA Right   . FINGER ARTHROPLASTY Left 08/21/2015   Procedure: LEFT THUMB  ARTHROPLASTY (CMC);  Surgeon: Deeann SaintHoward Miller, MD;  Location: [facility] ORS;  Service: Orthopedics;  Laterality: Left;  . TUBAL LIGATION    . TUMMY TUCK  2014    Prior to Admission medications   Medication Sig Start Date End Date Taking? Authorizing Provider  BIOTIN PO Take 1  tablet by mouth daily.   Yes Historical Provider, MD  BLACK COHOSH PO Take 1 tablet by mouth daily.   Yes Historical Provider, MD  cetirizine (ZYRTEC) 10 MG tablet Take 10 mg by mouth every morning.   Yes Historical Provider, MD  gabapentin (NEURONTIN) 400 MG capsule Take 1 capsule (400 mg total) by mouth 3 (three) times daily. 08/21/15  Yes Deeann SaintHoward Miller, MD  levothyroxine (SYNTHROID, LEVOTHROID) 50 MCG tablet  10/12/15  Yes Historical Provider, MD  milk thistle 175 MG tablet Take 175 mg by mouth daily.   Yes Historical Provider, MD  Misc Natural Products (PUMPKIN SEED OIL) CAPS Take 1 capsule by mouth daily.   Yes Historical Provider, MD  traMADol [name](ULTRAM) 50 MG tablet  10/13/15  Yes Historical Provider, MD  VENTOLIN HFA 108 (90 Base) MCG/ACT inhaler inhale 2 puffs by mouth PRN 07/26/15  Yes Historical Provider, MD  HYDROcodone-acetaminophen (NORCO) 5-325 MG tablet Take 1-2 tablets by mouth every 6 (six) hours as needed. Patient not taking: Reported on 12/19/2015 08/21/15   Deeann SaintHoward Miller, MD  meloxicam (MOBIC) 15 MG tablet Take 15 mg by mouth daily.  07/25/15   Historical Provider, MD  meloxicam (MOBIC) 15 MG tablet Take 1 tablet (15 mg total) by mouth daily. Patient not taking: Reported on 12/19/2015 08/21/15   Deeann SaintHoward Miller, MD  omeprazole [facility](PRILOSEC)  20 MG capsule Take 20 mg by mouth every morning.    Historical Provider, MD  Papaya TABS Take 1 tablet by mouth daily.    Historical Provider, MD  UNKNOWN TO PATIENT PT IS STARTING A THYROID PILL BUT HAS NOT PICKED IT UP FROM HER PHARMACY YET (08-10-15)    Historical Provider, MD    History reviewed. No pertinent family history.   Social History  Substance Use Topics  . Smoking status: Former Smoker    Types: Cigarettes  . Smokeless tobacco: Never Used  . Alcohol use Yes     Comment: RED WINE DAILY    Allergies as of 12/19/2015 - Review Complete 12/19/2015  Allergen Reaction Noted  . Latex Anaphylaxis 08/07/2015  . Crestor [rosuvastatin] Itching  08/10/2015  . Other Hives and Itching 08/10/2015  . Pineapple  08/10/2015  . Soy allergy Hives 08/10/2015  . Ultram [tramadol] Itching 08/10/2015    Review of Systems:    All systems reviewed and negative except where noted in HPI.   Physical Exam:  BP (!) 168/80   Pulse 77   Temp 98.2 F (36.8 C) (Oral)   Ht 5\' 2"  (1.575 m)   Wt 167 lb (75.8 kg)   BMI 30.54 kg/m  No LMP recorded. Patient is postmenopausal. Psych:  Alert and cooperative. Normal mood and affect. General:   Alert,  Well-developed, well-nourished, pleasant and cooperative in NAD Head:  Normocephalic and atraumatic. Eyes:  Sclera clear, no icterus.   Conjunctiva pink. Ears:  Normal auditory acuity. Nose:  No deformity, discharge, or lesions. Mouth:  No deformity or lesions,oropharynx pink & moist. Neck:  Supple; no masses or thyromegaly. Lungs:  Respirations even and unlabored.  Clear throughout to auscultation.   No wheezes, crackles, or rhonchi. No acute distress. Heart:  Regular rate and rhythm; no murmurs, clicks, rubs, or gallops. Abdomen:  Normal bowel sounds.  No bruits.  Soft, non-tender and non-distended without masses, hepatosplenomegaly or hernias noted.  No guarding or rebound tenderness.    Psych:  Alert and cooperative. Normal mood and affect.  Imaging Studies: No results found.  Assessment and Plan:   Evelyn RankinsRobin Simon is a 60 y.o. y/o female is here today for gas ,bloating and change in bowel habits. 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 She alsohas a change in bowel habits.  Plan   1. EGD+colonoscopy  2. Celiac serology ,TSH  Follow up in 8 weeks.   Dr Wyline MoodKiran Ildefonso Keaney MD

## 2015-12-21 ENCOUNTER — Ambulatory Visit: Payer: BLUE CROSS/BLUE SHIELD | Admitting: Anesthesiology

## 2015-12-21 ENCOUNTER — Encounter
Admission: RE | Disposition: A | Discharge status: Home / Self Care | Payer: Self-pay | Source: Ambulatory Visit | Attending: Gastroenterology

## 2015-12-21 ENCOUNTER — Ambulatory Visit
Admission: RE | Admit: 2015-12-21 | Discharge: 2015-12-21 | Disposition: A | Discharge status: Home / Self Care | Payer: BLUE CROSS/BLUE SHIELD | Source: Ambulatory Visit | Attending: Gastroenterology | Admitting: Gastroenterology

## 2015-12-21 ENCOUNTER — Encounter: Payer: Self-pay | Admitting: *Deleted

## 2015-12-21 DIAGNOSIS — Z91018 Allergy to other foods: Secondary | ICD-10-CM | POA: Insufficient documentation

## 2015-12-21 DIAGNOSIS — Z6830 Body mass index (BMI) 30.0-30.9, adult: Secondary | ICD-10-CM | POA: Insufficient documentation

## 2015-12-21 DIAGNOSIS — Z91040 Latex allergy status: Secondary | ICD-10-CM | POA: Diagnosis not present

## 2015-12-21 DIAGNOSIS — E039 Hypothyroidism, unspecified: Secondary | ICD-10-CM | POA: Insufficient documentation

## 2015-12-21 DIAGNOSIS — K219 Gastro-esophageal reflux disease without esophagitis: Secondary | ICD-10-CM | POA: Insufficient documentation

## 2015-12-21 DIAGNOSIS — Z888 Allergy status to other drugs, medicaments and biological substances status: Secondary | ICD-10-CM | POA: Insufficient documentation

## 2015-12-21 DIAGNOSIS — Z87891 Personal history of nicotine dependence: Secondary | ICD-10-CM | POA: Insufficient documentation

## 2015-12-21 DIAGNOSIS — J4520 Mild intermittent asthma, uncomplicated: Secondary | ICD-10-CM | POA: Diagnosis not present

## 2015-12-21 DIAGNOSIS — M1990 Unspecified osteoarthritis, unspecified site: Secondary | ICD-10-CM | POA: Insufficient documentation

## 2015-12-21 DIAGNOSIS — R1013 Epigastric pain: Secondary | ICD-10-CM

## 2015-12-21 DIAGNOSIS — K3189 Other diseases of stomach and duodenum: Secondary | ICD-10-CM | POA: Diagnosis not present

## 2015-12-21 DIAGNOSIS — E669 Obesity, unspecified: Secondary | ICD-10-CM | POA: Diagnosis not present

## 2015-12-21 DIAGNOSIS — K5730 Diverticulosis of large intestine without perforation or abscess without bleeding: Secondary | ICD-10-CM | POA: Diagnosis not present

## 2015-12-21 DIAGNOSIS — Z885 Allergy status to narcotic agent status: Secondary | ICD-10-CM | POA: Insufficient documentation

## 2015-12-21 DIAGNOSIS — R197 Diarrhea, unspecified: Secondary | ICD-10-CM | POA: Diagnosis not present

## 2015-12-21 HISTORY — PX: COLONOSCOPY WITH PROPOFOL: SHX5780

## 2015-12-21 HISTORY — PX: ESOPHAGOGASTRODUODENOSCOPY (EGD) WITH PROPOFOL: SHX5813

## 2015-12-21 LAB — CELIAC DISEASE PANEL
Endomysial Ab, IgA: NEGATIVE
IgA: 157 mg/dL (ref 87–352)
Tissue Transglutaminase Ab, IgA: <2 U/mL (ref 0–3)

## 2015-12-21 SURGERY — COLONOSCOPY WITH PROPOFOL
Anesthesia: General

## 2015-12-21 MED ORDER — PROPOFOL 500 MG/50ML IV EMUL
INTRAVENOUS | Status: DC | PRN
  Administered 2015-12-21: 150 ug/kg/min via INTRAVENOUS

## 2015-12-21 MED ORDER — METOPROLOL TARTRATE 5 MG/5ML IV SOLN
INTRAVENOUS | Status: DC | PRN
  Administered 2015-12-21: 2 mg via INTRAVENOUS

## 2015-12-21 MED ORDER — MIDAZOLAM HCL 2 MG/2ML IJ SOLN
INTRAMUSCULAR | Status: DC | PRN
  Administered 2015-12-21: 2 mg via INTRAVENOUS

## 2015-12-21 MED ORDER — PHENYLEPHRINE HCL 10 MG/ML IJ SOLN
INTRAMUSCULAR | Status: DC | PRN
  Administered 2015-12-21: 200 ug via INTRAVENOUS

## 2015-12-21 MED ORDER — PROPOFOL 10 MG/ML IV BOLUS
INTRAVENOUS | Status: DC | PRN
  Administered 2015-12-21: 60 mg via INTRAVENOUS

## 2015-12-21 MED ORDER — SODIUM CHLORIDE 0.9 % IV SOLN
INTRAVENOUS | Status: DC
Start: 2015-12-21 — End: 2015-12-21
  Administered 2015-12-21 (×2): via INTRAVENOUS

## 2015-12-21 MED ORDER — LIDOCAINE HCL (CARDIAC) 20 MG/ML IV SOLN
INTRAVENOUS | Status: DC | PRN
  Administered 2015-12-21: 60 mg via INTRAVENOUS

## 2015-12-21 NOTE — Anesthesia Postprocedure Evaluation (Signed)
Anesthesia Post Note  Patient: Evelyn Simon  Procedure(s) Performed: Procedure(s) (LRB): COLONOSCOPY WITH PROPOFOL (N/A) ESOPHAGOGASTRODUODENOSCOPY (EGD) WITH PROPOFOL (N/A)  Patient location during evaluation: Endoscopy Anesthesia Type: General Level of consciousness: awake and alert and oriented Pain management: pain level controlled Vital Signs Assessment: post-procedure vital signs reviewed and stable Respiratory status: spontaneous breathing, nonlabored ventilation and respiratory function stable Cardiovascular status: blood pressure returned to baseline and stable Postop Assessment: no signs of nausea or vomiting Anesthetic complications: no    Last Vitals:  Vitals:   12/21/15 0819 12/21/15 0915  BP: (!) 170/83 (!) 169/74  Resp: 18 18  Temp: 36.8 C (!) 35.9 C    Last Pain:  Vitals:   12/21/15 0915  TempSrc: Tympanic                 Audyn Dimercurio

## 2015-12-21 NOTE — Op Note (Signed)
[facility] Gastroenterology Patient Name: Evelyn Simon Procedure Date: 12/21/2015 8:45 AM MRN: [id number] Account #: [id number] Date of Birth: 21-Mar-1955 Admit Type: Outpatient Age: 60 Room: [facility] ENDO ROOM 4 Gender: Female Note Status: Finalized Procedure:            Upper GI endoscopy Indications:          Dyspepsia Providers:            Wyline Mood MD, MD Referring MD:         Sherrie Mustache, MD (Referring MD) Medicines:            Monitored Anesthesia Care Complications:        No immediate complications. Procedure:            Pre-Anesthesia Assessment:                       - Prior to the procedure, a History and Physical was                        performed, and patient medications, allergies and                        sensitivities were reviewed. The patient's tolerance of                        previous anesthesia was reviewed.                       - The risks and benefits of the procedure and the                        sedation options and risks were discussed with the                        patient. All questions were answered and informed                        consent was obtained.                       - The risks and benefits of the procedure and the                        sedation options and risks were discussed with the                        patient. All questions were answered and informed                        consent was obtained.                       - ASA Grade Assessment: II - A patient with mild                        systemic disease.                       After obtaining informed consent, the endoscope was  passed under direct vision. Throughout the procedure,                        the patient's blood pressure, pulse, and oxygen                        saturations were monitored continuously. The Endoscope                        was introduced through the mouth, and advanced to the                        third  part of duodenum. The upper GI endoscopy was                        accomplished with ease. The patient tolerated the                        procedure well. Findings:      The stomach was normal.      The esophagus was normal.      The examined duodenum was normal. Biopsies for histology were taken with       a cold forceps for evaluation of celiac disease. Impression:           - Normal stomach.                       - Normal esophagus.                       - Normal examined duodenum. Biopsied. Recommendation:       - Patient has a contact number available for                        emergencies. The signs and symptoms of potential                        delayed complications were discussed with the patient.                        Return to normal activities tomorrow. Written discharge                        instructions were provided to the patient.                       - Resume previous diet.                       - Continue present medications.                       - Await pathology results.                       - No repeat upper endoscopy.                       - Return to GI office as previously scheduled.                       - Discharge patient to home (with escort). Procedure  Code(s):    --- Professional ---                       [id number], Esophagogastroduodenoscopy, flexible, transoral;                        with biopsy, single or multiple Diagnosis Code(s):    --- Professional ---                       R10.13, Epigastric pain CPT copyright 2016 American Medical Association. All rights reserved. The codes documented in this report are preliminary and upon coder review may  be revised to meet current compliance requirements. Wyline MoodKiran Jurnei Latini, MD Wyline MoodKiran Esperansa Sarabia MD, MD 12/21/2015 8:54:42 AM This report has been signed electronically. Number of Addenda: 0 Note Initiated On: 12/21/2015 8:45 AM      [facility]

## 2015-12-21 NOTE — Op Note (Signed)
[facility] Gastroenterology Patient Name: Evelyn RankinsRobin Simon Procedure Date: 12/21/2015 8:45 AM MRN: [id number] Account #: [id number] Date of Birth: [birth year] Admit Type: Outpatient Age: [age] Room: [facility] ENDO ROOM 4 Gender: Female Note Status: Finalized Procedure:            Colonoscopy Indications:          Diarrhea Providers:            Wyline MoodKiran Yemariam Magar MD, MD Referring MD:         Sherrie MustacheFayegh Jadali, MD (Referring MD) Medicines:            Monitored Anesthesia Care Complications:        No immediate complications. Procedure:            Pre-Anesthesia Assessment:                       - Prior to the procedure, a History and Physical was                        performed, and patient medications, allergies and                        sensitivities were reviewed. The patient's tolerance of                        previous anesthesia was reviewed.                       - The risks and benefits of the procedure and the                        sedation options and risks were discussed with the                        patient. All questions were answered and informed                        consent was obtained.                       - The risks and benefits of the procedure and the                        sedation options and risks were discussed with the                        patient. All questions were answered and informed                        consent was obtained.                       - ASA Grade Assessment: II - A patient with mild                        systemic disease.                       After obtaining informed consent, the colonoscope was                        passed under  direct vision. Throughout the procedure,                        the patient's blood pressure, pulse, and oxygen                        saturations were monitored continuously. The                        Colonoscope was introduced through the anus and                        advanced to the the cecum,  identified by the                        appendiceal orifice, IC valve and transillumination.                        The colonoscopy was performed with ease. The patient                        tolerated the procedure well. The quality of the bowel                        preparation was inadequate. Findings:      The perianal and digital rectal examinations were normal.      Multiple medium-mouthed diverticula were found in the entire colon.      A moderate amount of semi-solid stool was found in the ascending colon,       precluding visualization.      A moderate amount of semi-liquid stool was found in the transverse       colon, precluding visualization.      The area from transverse colon to anus appeared normal. Biopsies for       histology were taken with a cold forceps for evaluation of microscopic       colitis. Impression:           - Preparation of the colon was inadequate.                       - Diverticulosis in the entire examined colon.                       - Stool in the ascending colon.                       - Stool in the transverse colon.                       - No specimens collected. Recommendation:       - Repeat colonoscopy in 6 weeks because the bowel                        preparation was poor.                       - Return to GI office as previously scheduled. Procedure Code(s):    --- Professional ---                       [id number], Colonoscopy, flexible; with biopsy, single or  multiple Diagnosis Code(s):    --- Professional ---                       R19.7, Diarrhea, unspecified                       K57.30, Diverticulosis of large intestine without                        perforation or abscess without bleeding CPT copyright 2016 American Medical Association. All rights reserved. The codes documented in this report are preliminary and upon coder review may  be revised to meet current compliance requirements. [name], MD [name]  MD, MD 12/21/2015 9:15:45 AM This report has been signed electronically. Number of Addenda: 0 Note Initiated On: 12/21/2015 8:45 AM Scope Withdrawal Time: 0 hours 5 minutes 56 seconds  Total Procedure Duration: 0 hours 12 minutes 46 seconds       [facility]

## 2015-12-21 NOTE — Transfer of Care (Signed)
Immediate Anesthesia Transfer of Care Note  Patient: Evelyn Simon  Procedure(s) Performed: Procedure(s): COLONOSCOPY WITH PROPOFOL (N/A) ESOPHAGOGASTRODUODENOSCOPY (EGD) WITH PROPOFOL (N/A)  Patient Location: Endoscopy Unit  Anesthesia Type:General  Level of Consciousness: awake and patient cooperative  Airway & Oxygen Therapy: Patient Spontanous Breathing and Patient connected to nasal cannula oxygen  Post-op Assessment: Report given to RN, Post -op Vital signs reviewed and stable and Patient moving all extremities X 4  Post vital signs: Reviewed and stable  Last Vitals:  Vitals:   12/21/15 0819  BP: (!) 170/83  Resp: 18  Temp: 36.8 C    Last Pain:  Vitals:   12/21/15 0819  TempSrc: Tympanic         Complications: No apparent anesthesia complications

## 2015-12-21 NOTE — Anesthesia Preprocedure Evaluation (Signed)
Anesthesia Evaluation  Patient identified by MRN, date of birth, ID band Patient awake    Reviewed: Allergy & Precautions, NPO status , Patient's Chart, lab work & pertinent test results  History of Anesthesia Complications Negative for: history of anesthetic complications  Airway Mallampati: II  TM Distance: >3 FB Neck ROM: Full    Dental no notable dental hx.    Pulmonary asthma (mild intermittent) , neg sleep apnea, former smoker,    breath sounds clear to auscultation- rhonchi (-) wheezing      Cardiovascular Exercise Tolerance: Good (-) hypertension(-) CAD and (-) Past MI  Rhythm:Regular Rate:Normal - Systolic murmurs and - Diastolic murmurs    Neuro/Psych  Headaches, negative psych ROS   GI/Hepatic Neg liver ROS, GERD  ,  Endo/Other  neg diabetesHypothyroidism   Renal/GU negative Renal ROS     Musculoskeletal  (+) Arthritis ,   Abdominal (+) + obese,   Peds  Hematology negative hematology ROS (+)   Anesthesia Other Findings Past Medical History: No date: Arthritis No date: Asthma No date: GERD (gastroesophageal reflux disease) No date: Headache     Comment: H/O MIGRAINES No date: Hypothyroidism   Reproductive/Obstetrics                             Anesthesia Physical Anesthesia Plan  ASA: II  Anesthesia Plan: General   Post-op Pain Management:    Induction: Intravenous  Airway Management Planned: Natural Airway  Additional Equipment:   Intra-op Plan:   Post-operative Plan:   Informed Consent: I have reviewed the patients History and Physical, chart, labs and discussed the procedure including the risks, benefits and alternatives for the proposed anesthesia with the patient or authorized representative who has indicated his/her understanding and acceptance.   Dental advisory given  Plan Discussed with: CRNA and Anesthesiologist  Anesthesia Plan Comments:          Anesthesia Quick Evaluation

## 2015-12-21 NOTE — H&P (Signed)
Evelyn MoodKiran Hajar Penninger MD [address] [place], [place] [address] Phone: [phone] Fax : [phone]  Primary Care Physician:  Sherrie MustacheFayegh Simon Primary Gastroenterologist:  Dr. Wyline MoodKiran Dameion Simon   Pre-Procedure History & Physical: HPI:  Evelyn Simon is a 60 y.o. female is here for an endoscopy and colonoscopy.   Past Medical History:  Diagnosis Date  . Arthritis   . Asthma   . GERD (gastroesophageal reflux disease)   . Headache    H/O MIGRAINES  . Hypothyroidism     Past Surgical History:  Procedure Laterality Date  . CESAREAN SECTION     X3  . DIAGNOSTIC LAPAROSCOPY    . DILATION AND CURETTAGE OF UTERUS    . EXCISION MORTON'S NEUROMA Right   . FINGER ARTHROPLASTY Left 08/21/2015   Procedure: LEFT THUMB  ARTHROPLASTY (CMC);  Surgeon: Evelyn SaintHoward Miller, MD;  Location: [facility] ORS;  Service: Orthopedics;  Laterality: Left;  . TUBAL LIGATION    . TUMMY TUCK  2014    Prior to Admission medications   Medication Sig Start Date End Date Taking? Authorizing Provider  BIOTIN PO Take 1 tablet by mouth daily.   Yes Historical Provider, MD  BLACK COHOSH PO Take 1 tablet by mouth daily.   Yes Historical Provider, MD  cetirizine (ZYRTEC) 10 MG tablet Take 10 mg by mouth every morning.   Yes Historical Provider, MD  gabapentin (NEURONTIN) 400 MG capsule Take 1 capsule (400 mg total) by mouth 3 (three) times daily. 08/21/15  Yes Evelyn SaintHoward Miller, MD  levothyroxine (SYNTHROID, LEVOTHROID) 50 MCG tablet  10/12/15  Yes Historical Provider, MD  milk thistle 175 MG tablet Take 175 mg by mouth daily.   Yes Historical Provider, MD  Misc Natural Products (PUMPKIN SEED OIL) CAPS Take 1 capsule by mouth daily.   Yes Historical Provider, MD  Papaya TABS Take 1 tablet by mouth daily.   Yes Historical Provider, MD  traMADol Evelyn Sark(ULTRAM) 50 MG tablet  10/13/15  Yes Historical Provider, MD  VENTOLIN HFA 108 (90 Base) MCG/ACT inhaler inhale 2 puffs by mouth PRN 07/26/15  Yes Historical Provider, MD  doxycycline (VIBRAMYCIN) 100  MG capsule Take 1 capsule (100 mg total) by mouth 2 (two) times daily. 12/19/15 12/29/15  Evelyn MoodKiran Evelyn Highland, MD  HYDROcodone-acetaminophen (NORCO) 5-325 MG tablet Take 1-2 tablets by mouth every 6 (six) hours as needed. Patient not taking: Reported on 12/19/2015 08/21/15   Evelyn SaintHoward Miller, MD  meloxicam (MOBIC) 15 MG tablet Take 15 mg by mouth daily.  07/25/15   Historical Provider, MD  meloxicam (MOBIC) 15 MG tablet Take 1 tablet (15 mg total) by mouth daily. Patient not taking: Reported on 12/21/2015 08/21/15   Evelyn SaintHoward Miller, MD  omeprazole (PRILOSEC) 20 MG capsule Take 20 mg by mouth every morning.    Historical Provider, MD  UNKNOWN TO PATIENT PT IS STARTING A THYROID PILL BUT HAS NOT PICKED IT UP FROM HER PHARMACY YET (08-10-15)    Historical Provider, MD    Allergies as of 12/19/2015 - Review Complete 12/19/2015  Allergen Reaction Noted  . Latex Anaphylaxis 08/07/2015  . Crestor [rosuvastatin] Itching 08/10/2015  . Other Hives and Itching 08/10/2015  . Pineapple  08/10/2015  . Soy allergy Hives 08/10/2015  . Ultram [tramadol] Itching 08/10/2015    History reviewed. No pertinent family history.  Social History   Social History  . Marital status: Married    Spouse name: N/A  . Number of children: N/A  . Years of education: N/A   Occupational History  .  Not on file.   Social History Main Topics  . Smoking status: Former Smoker    Types: Cigarettes  . Smokeless tobacco: Never Used  . Alcohol use Yes     Comment: RED WINE DAILY  . Drug use: No  . Sexual activity: Not on file   Other Topics Concern  . Not on file   Social History Narrative  . No narrative on file    Review of Systems: See HPI, otherwise negative ROS  Physical Exam: There were no vitals taken for this visit. General:   Alert,  pleasant and cooperative in NAD Head:  Normocephalic and atraumatic. Neck:  Supple; no masses or thyromegaly. Lungs:  Clear throughout to auscultation.    Heart:  Regular rate and  rhythm. Abdomen:  Soft, nontender and nondistended. Normal bowel sounds, without guarding, and without rebound.   Neurologic:  Alert and  oriented x4;  grossly normal neurologically.  Impression/Plan: Evelyn Simon  Risks, benefits, limitations, and alternatives regarding  endoscopy and colonoscopy have been reviewed with the patient.  Questions have been answered.  All parties agreeable.   Evelyn MoodKiran Eleazar Kimmey, MD  12/21/2015, 8:17 AM

## 2015-12-22 ENCOUNTER — Encounter: Payer: Self-pay | Admitting: Gastroenterology

## 2015-12-22 LAB — SURGICAL PATHOLOGY

## 2015-12-25 ENCOUNTER — Telehealth: Payer: Self-pay | Admitting: Gastroenterology

## 2015-12-25 NOTE — Telephone Encounter (Signed)
Had colonoscopy but still needs a EGD

## 2015-12-27 ENCOUNTER — Other Ambulatory Visit: Payer: Self-pay

## 2015-12-27 NOTE — Telephone Encounter (Signed)
Patient called asking to reschedule her EGD. I asked Ginger to give me a date and patient agreed on [facility]. I also told Ginger to schedule and place orders for patient to have this done.

## 2015-12-28 ENCOUNTER — Telehealth: Payer: Self-pay

## 2015-12-28 NOTE — Telephone Encounter (Signed)
Pt notified of colonoscopy and lab results.

## 2015-12-28 NOTE — Telephone Encounter (Signed)
-----   Message from Wyline MoodKiran Anna, MD sent at 12/27/2015 12:11 PM EST ----- Celiac serology - negative

## 2015-12-28 NOTE — Telephone Encounter (Signed)
-----   Message from Wyline MoodKiran Anna, MD sent at 12/27/2015 12:10 PM EST ----- Normal colon bx and duodenal bx- repeat colonoscopy as suggested in my note due to poor prep

## 2016-01-01 ENCOUNTER — Encounter: Payer: Self-pay | Admitting: *Deleted

## 2016-01-01 MED ORDER — SODIUM CHLORIDE 0.9 % IV SOLN
INTRAVENOUS | Status: DC
Start: 2016-01-01 — End: 2016-01-02
  Administered 2016-01-02: 1000 mL via INTRAVENOUS

## 2016-01-02 ENCOUNTER — Ambulatory Visit: Payer: BLUE CROSS/BLUE SHIELD | Admitting: Anesthesiology

## 2016-01-02 ENCOUNTER — Encounter
Admission: RE | Disposition: A | Discharge status: Home / Self Care | Payer: Self-pay | Source: Ambulatory Visit | Attending: Gastroenterology

## 2016-01-02 ENCOUNTER — Ambulatory Visit
Admission: RE | Admit: 2016-01-02 | Discharge: 2016-01-02 | Disposition: A | Discharge status: Home / Self Care | Payer: BLUE CROSS/BLUE SHIELD | Source: Ambulatory Visit | Attending: Gastroenterology | Admitting: Gastroenterology

## 2016-01-02 DIAGNOSIS — K30 Functional dyspepsia: Secondary | ICD-10-CM | POA: Diagnosis not present

## 2016-01-02 DIAGNOSIS — K228 Other specified diseases of esophagus: Secondary | ICD-10-CM | POA: Diagnosis not present

## 2016-01-02 DIAGNOSIS — Z87891 Personal history of nicotine dependence: Secondary | ICD-10-CM | POA: Insufficient documentation

## 2016-01-02 DIAGNOSIS — Z79899 Other long term (current) drug therapy: Secondary | ICD-10-CM | POA: Insufficient documentation

## 2016-01-02 DIAGNOSIS — E039 Hypothyroidism, unspecified: Secondary | ICD-10-CM | POA: Insufficient documentation

## 2016-01-02 DIAGNOSIS — K219 Gastro-esophageal reflux disease without esophagitis: Secondary | ICD-10-CM | POA: Diagnosis not present

## 2016-01-02 DIAGNOSIS — J45909 Unspecified asthma, uncomplicated: Secondary | ICD-10-CM | POA: Insufficient documentation

## 2016-01-02 DIAGNOSIS — K2970 Gastritis, unspecified, without bleeding: Secondary | ICD-10-CM | POA: Diagnosis not present

## 2016-01-02 HISTORY — PX: ESOPHAGOGASTRODUODENOSCOPY (EGD) WITH PROPOFOL: SHX5813

## 2016-01-02 SURGERY — ESOPHAGOGASTRODUODENOSCOPY (EGD) WITH PROPOFOL
Anesthesia: General

## 2016-01-02 MED ORDER — PROPOFOL 10 MG/ML IV BOLUS
INTRAVENOUS | Status: DC | PRN
Start: 2016-01-02 — End: 2016-01-02
  Administered 2016-01-02: 60 mg via INTRAVENOUS

## 2016-01-02 MED ORDER — LIDOCAINE HCL (PF) 1 % IJ SOLN
2.0000 mL | Freq: Once | INTRAMUSCULAR | Status: AC
  Administered 2016-01-02: 0.3 mL via INTRADERMAL

## 2016-01-02 MED ORDER — GLYCOPYRROLATE 0.2 MG/ML IJ SOLN
INTRAMUSCULAR | Status: DC | PRN
  Administered 2016-01-02: 0.2 mg via INTRAVENOUS

## 2016-01-02 MED ORDER — LIDOCAINE HCL (PF) 1 % IJ SOLN
INTRAMUSCULAR | Status: DC | PRN
  Administered 2016-01-02: 40 mg

## 2016-01-02 MED ORDER — PROPOFOL 500 MG/50ML IV EMUL
INTRAVENOUS | Status: DC | PRN
  Administered 2016-01-02: 175 ug/kg/min via INTRAVENOUS

## 2016-01-02 MED ORDER — MIDAZOLAM HCL 2 MG/2ML IJ SOLN
INTRAMUSCULAR | Status: DC | PRN
  Administered 2016-01-02: 2 mg via INTRAVENOUS

## 2016-01-02 MED ORDER — LIDOCAINE HCL (PF) 1 % IJ SOLN
INTRAMUSCULAR | Status: AC
  Administered 2016-01-02: 0.3 mL via INTRADERMAL
  Filled 2016-01-02: qty 2

## 2016-01-02 NOTE — Anesthesia Preprocedure Evaluation (Signed)
Anesthesia Evaluation  Patient identified by MRN, date of birth, ID band Patient awake    Reviewed: Allergy & Precautions, NPO status , Patient's Chart, lab work & pertinent test results  History of Anesthesia Complications Negative for: history of anesthetic complications  Airway Mallampati: II       Dental   Pulmonary asthma , former smoker,           Cardiovascular negative cardio ROS  + dysrhythmias (PVC's)      Neuro/Psych negative neurological ROS     GI/Hepatic Neg liver ROS, GERD  Medicated and Poorly Controlled,  Endo/Other  Hypothyroidism   Renal/GU negative Renal ROS     Musculoskeletal  (+) Arthritis , Osteoarthritis,    Abdominal   Peds  Hematology negative hematology ROS (+)   Anesthesia Other Findings   Reproductive/Obstetrics                             Anesthesia Physical Anesthesia Plan  ASA: II  Anesthesia Plan: General   Post-op Pain Management:    Induction: Intravenous  Airway Management Planned: Nasal Cannula  Additional Equipment:   Intra-op Plan:   Post-operative Plan:   Informed Consent: I have reviewed the patients History and Physical, chart, labs and discussed the procedure including the risks, benefits and alternatives for the proposed anesthesia with the patient or authorized representative who has indicated his/her understanding and acceptance.     Plan Discussed with:   Anesthesia Plan Comments:         Anesthesia Quick Evaluation

## 2016-01-02 NOTE — H&P (Signed)
Midge Miniumarren Jamilah Jean, MD [facility] [address] [place], [place] [address] Phone: [phone] Fax : [phone]  Primary Care Physician:  Sherrie MustacheFayegh Jadali Primary Gastroenterologist:  Dr. Servando SnareWohl  Pre-Procedure History & Physical: HPI:  Evelyn RankinsRobin Simon is a 60 y.o. female is here for an endoscopy.   Past Medical History:  Diagnosis Date  . Arthritis   . Asthma   . GERD (gastroesophageal reflux disease)   . Headache    H/O MIGRAINES  . Hypothyroidism     Past Surgical History:  Procedure Laterality Date  . CESAREAN SECTION     X3  . COLONOSCOPY WITH PROPOFOL N/A 12/21/2015   Procedure: COLONOSCOPY WITH PROPOFOL;  Surgeon: Wyline MoodKiran Anna, MD;  Location: [facility] ENDOSCOPY;  Service: Endoscopy;  Laterality: N/A;  . DIAGNOSTIC LAPAROSCOPY    . DILATION AND CURETTAGE OF UTERUS    . ESOPHAGOGASTRODUODENOSCOPY (EGD) WITH PROPOFOL N/A 12/21/2015   Procedure: ESOPHAGOGASTRODUODENOSCOPY (EGD) WITH PROPOFOL;  Surgeon: Wyline MoodKiran Anna, MD;  Location: [facility] ENDOSCOPY;  Service: Endoscopy;  Laterality: N/A;  . EXCISION MORTON'S NEUROMA Right   . FINGER ARTHROPLASTY Left 08/21/2015   Procedure: LEFT THUMB  ARTHROPLASTY (CMC);  Surgeon: Deeann SaintHoward Miller, MD;  Location: [facility] ORS;  Service: Orthopedics;  Laterality: Left;  . TUBAL LIGATION    . TUMMY TUCK  2014    Prior to Admission medications   Medication Sig Start Date End Date Taking? Authorizing Provider  BIOTIN PO Take 1 tablet by mouth daily.    Historical Provider, MD  BLACK COHOSH PO Take 1 tablet by mouth daily.    Historical Provider, MD  cetirizine (ZYRTEC) 10 MG tablet Take 10 mg by mouth every morning.    Historical Provider, MD  gabapentin (NEURONTIN) 400 MG capsule Take 1 capsule (400 mg total) by mouth 3 (three) times daily. 08/21/15   Deeann SaintHoward Miller, MD  HYDROcodone-acetaminophen (NORCO) 5-325 MG tablet Take 1-2 tablets by mouth every 6 (six) hours as needed. Patient not taking: Reported on 12/19/2015 08/21/15   Deeann SaintHoward Miller, MD  levothyroxine  (SYNTHROID, LEVOTHROID) 50 MCG tablet  10/12/15   Historical Provider, MD  milk thistle 175 MG tablet Take 175 mg by mouth daily.    Historical Provider, MD  Misc Natural Products (PUMPKIN SEED OIL) CAPS Take 1 capsule by mouth daily.    Historical Provider, MD  omeprazole (PRILOSEC) 20 MG capsule Take 20 mg by mouth every morning.    Historical Provider, MD  Papaya TABS Take 1 tablet by mouth daily.    Historical Provider, MD  traMADol [name](ULTRAM) 50 MG tablet  10/13/15   Historical Provider, MD  UNKNOWN TO PATIENT PT IS STARTING A THYROID PILL BUT HAS NOT PICKED IT UP FROM HER PHARMACY YET (08-10-15)    Historical Provider, MD  VENTOLIN HFA 108 (90 Base) MCG/ACT inhaler inhale 2 puffs by mouth PRN 07/26/15   Historical Provider, MD    Allergies as of 12/27/2015 - Review Complete 12/21/2015  Allergen Reaction Noted  . Latex Anaphylaxis 08/07/2015  . Crestor [rosuvastatin] Itching 08/10/2015  . Other Hives and Itching 08/10/2015  . Pineapple  08/10/2015  . Soy allergy Hives 08/10/2015  . Ultram [tramadol] Itching 08/10/2015    History reviewed. No pertinent family history.  Social History   Social History  . Marital status: Married    Spouse name: N/A  . Number of children: N/A  . Years of education: N/A   Occupational History  . Not on file.   Social History Main Topics  . Smoking status: Former  Smoker    Types: Cigarettes  . Smokeless tobacco: Never Used  . Alcohol use Yes     Comment: RED WINE DAILY  . Drug use: No  . Sexual activity: Not on file   Other Topics Concern  . Not on file   Social History Narrative  . No narrative on file    Review of Systems: See HPI, otherwise negative ROS  Physical Exam: BP 126/73   Pulse 86   Temp 97.6 F (36.4 C) (Tympanic)   Resp 15   Ht 5\' 2"  (1.575 m)   Wt 162 lb (73.5 kg)   SpO2 99%   BMI 29.63 kg/m  General:   Alert,  pleasant and cooperative in NAD Head:  Normocephalic and atraumatic. Neck:  Supple; no masses or  thyromegaly. Lungs:  Clear throughout to auscultation.    Heart:  Regular rate and rhythm. Abdomen:  Soft, nontender and nondistended. Normal bowel sounds, without guarding, and without rebound.   Neurologic:  Alert and  oriented x4;  grossly normal neurologically.  Impression/Plan: Evelyn RankinsRobin Hsiao is here for an endoscopy to be performed for dyspepsia and gerd  Risks, benefits, limitations, and alternatives regarding  endoscopy have been reviewed with the patient.  Questions have been answered.  All parties agreeable.   Midge Miniumarren Alton Bouknight, MD  01/02/2016, 10:30 AM

## 2016-01-02 NOTE — Anesthesia Procedure Notes (Signed)
Performed by: Jacarra Bobak Pre-anesthesia Checklist: Patient identified, Emergency Drugs available, Suction available, Patient being monitored and Timeout performed Patient Re-evaluated:Patient Re-evaluated prior to inductionOxygen Delivery Method: Nasal cannula Preoxygenation: Pre-oxygenation with 100% oxygen Intubation Type: IV induction       

## 2016-01-02 NOTE — Anesthesia Postprocedure Evaluation (Signed)
Anesthesia Post Note  Patient: Evelyn Simon  Procedure(s) Performed: Procedure(s) (LRB): ESOPHAGOGASTRODUODENOSCOPY (EGD) WITH PROPOFOL (N/A)  Patient location during evaluation: Endoscopy Anesthesia Type: General Level of consciousness: awake and alert Pain management: pain level controlled Vital Signs Assessment: post-procedure vital signs reviewed and stable Respiratory status: spontaneous breathing and respiratory function stable Cardiovascular status: stable Anesthetic complications: no     Last Vitals:  Vitals:   01/02/16 0930 01/02/16 1025  BP: (!) 172/74 126/73  Pulse: 76 86  Resp: 17 15  Temp: 36.4 C     Last Pain:  Vitals:   01/02/16 0930  TempSrc: Tympanic                 KEPHART,WILLIAM K

## 2016-01-02 NOTE — Op Note (Signed)
[facility] G[facility]enter[facility] Patient Name: Evelyn RankinsRobin Podolak Procedure Date: 01/02/2016 10:10 AM MRN: [id number] Account #: [id number] Date of Birth: [birth year] Admit Type: Outpatient Age: [age] [facility] Gender: Female Note Status: Finalized Procedure:            Upper GI endoscopy Indications:          Functional Dyspepsia, Heartburn Providers:            Midge Miniumarren Maely Clements MD, MD Referring MD:         Sherrie MustacheFayegh Jadali, MD (Referring MD) Medicines:            Propofol per Anesthesia Complications:        No immediate complications. Procedure:            Pre-Anesthesia Assessment:                       - Prior to the procedure, a History and Physical was                        performed, and patient medications and allergies were                        reviewed. The patient's tolerance of previous                        anesthesia was also reviewed. The risks and benefits of                        the procedure and the sedation options and risks were                        discussed with the patient. All questions were                        answered, and informed consent was obtained. Prior                        Anticoagulants: The patient has taken no previous                        anticoagulant or antiplatelet agents. ASA Grade                        Assessment: II - A patient with mild systemic disease.                        After reviewing the risks and benefits, the patient was                        deemed in satisfactory condition to undergo the                        procedure.                       After obtaining informed consent, the endoscope was                        passed under direct vision. Throughout the procedure,  the patient's blood pressure, pulse, and oxygen                        saturations were monitored continuously. The                        Colonoscope was introduced through the mouth, and      advanced to the second part of duodenum. The upper GI                        endoscopy was accomplished without difficulty. The                        patient tolerated the procedure well. Findings:      The Z-line was irregular and was found at the gastroesophageal junction.      Localized mild inflammation characterized by erythema was found in the       gastric antrum. Biopsies were taken with a cold forceps for histology.      The examined duodenum was normal. Biopsies were taken with a cold       forceps for histology. Impression:           - Z-line irregular, at the gastroesophageal junction.                       - Gastritis. Biopsied.                       - Normal examined duodenum. Biopsied. Recommendation:       - Discharge patient to home.                       - Resume previous diet.                       - Continue present medications.                       - Await pathology results.                       - Perform a colonoscopy at appointment to be scheduled. Procedure Code(s):    --- Professional ---                       [phone], Esophagogastroduodenoscopy, flexible, transoral;                        with biopsy, single or multiple Diagnosis Code(s):    --- Professional ---                       K30, Functional dyspepsia                       R12, Heartburn                       K29.70, Gastritis, unspecified, without bleeding                       K22.8, Other specified diseases of esophagus CPT copyright 2016 American Medical Association. All rights reserved. The codes documented in this report are preliminary and upon coder review may  be  revised to meet current compliance requirements. Midge Minium MD, MD 01/02/2016 10:24:05 AM This report has been signed electronically. Number of Addenda: 0 Note Initiated On: 01/02/2016 10:10 AM      [facility]

## 2016-01-02 NOTE — Transfer of Care (Signed)
Immediate Anesthesia Transfer of Care Note  Patient: Evelyn Simon  Procedure(s) Performed: Procedure(s): ESOPHAGOGASTRODUODENOSCOPY (EGD) WITH PROPOFOL (N/A)  Patient Location: PACU  Anesthesia Type:General  Level of Consciousness: awake, alert  and responds to stimulation  Airway & Oxygen Therapy: Patient Spontanous Breathing and Patient connected to nasal cannula oxygen  Post-op Assessment: Report given to RN and Post -op Vital signs reviewed and stable  Post vital signs: Reviewed and stable  Last Vitals:  Vitals:   01/02/16 0930 01/02/16 1025  BP: (!) 172/74 126/73  Pulse: 76 86  Resp: 17 15  Temp: 36.4 C     Last Pain:  Vitals:   01/02/16 0930  TempSrc: Tympanic         Complications: No apparent anesthesia complications

## 2016-01-03 ENCOUNTER — Telehealth: Payer: Self-pay | Admitting: Gastroenterology

## 2016-01-03 ENCOUNTER — Encounter: Payer: Self-pay | Admitting: Gastroenterology

## 2016-01-03 ENCOUNTER — Other Ambulatory Visit: Payer: Self-pay

## 2016-01-03 LAB — SURGICAL PATHOLOGY

## 2016-01-03 MED ORDER — DOXYCYCLINE HYCLATE 100 MG PO CAPS: 100.0000 mg | ORAL_CAPSULE | Freq: Two times a day (BID) | ORAL | 0 refills | Status: DC

## 2016-01-03 NOTE — Telephone Encounter (Signed)
Doxycycline 100 mg BID for 10 days, ensure not allergic

## 2016-01-03 NOTE — Telephone Encounter (Signed)
Dr. Servando SnareWohl done this pt's EGD yesterday and on your last ov note you were going to prescribe something for bacteria overgrowth. Please let me know what I can send.

## 2016-01-03 NOTE — Telephone Encounter (Signed)
Evelyn Simon from Endo called and stated that the patient said Dr. Servando SnareWohl was suppose to call her in an antibiotic. Her procedure was yesterday. Please call

## 2016-01-03 NOTE — Telephone Encounter (Signed)
Contacted pt and [facility] [address]

## 2016-01-09 ENCOUNTER — Encounter: Payer: Self-pay | Admitting: Gastroenterology

## 2016-01-12 ENCOUNTER — Telehealth: Payer: Self-pay | Admitting: Gastroenterology

## 2016-01-12 NOTE — Telephone Encounter (Signed)
results

## 2016-01-16 NOTE — Telephone Encounter (Signed)
Results letter mailed

## 2016-04-19 DIAGNOSIS — R002 Palpitations: Secondary | ICD-10-CM | POA: Insufficient documentation

## 2016-04-19 DIAGNOSIS — I10 Essential (primary) hypertension: Secondary | ICD-10-CM | POA: Insufficient documentation

## 2016-05-21 DIAGNOSIS — I2510 Atherosclerotic heart disease of native coronary artery without angina pectoris: Secondary | ICD-10-CM | POA: Insufficient documentation

## 2016-06-17 DIAGNOSIS — M65312 Trigger thumb, left thumb: Secondary | ICD-10-CM | POA: Insufficient documentation

## 2016-06-20 DIAGNOSIS — E039 Hypothyroidism, unspecified: Secondary | ICD-10-CM | POA: Insufficient documentation

## 2016-07-24 ENCOUNTER — Ambulatory Visit: Payer: Self-pay | Attending: Oncology | Admitting: *Deleted

## 2016-07-24 ENCOUNTER — Ambulatory Visit
Admission: RE | Admit: 2016-07-24 | Discharge: 2016-07-24 | Disposition: A | Discharge status: Home / Self Care | Payer: Self-pay | Source: Ambulatory Visit | Attending: Oncology | Admitting: Oncology

## 2016-07-24 VITALS — BP 176/119 | HR 78 | Temp 98.0°F | Resp 18 | Ht 62.0 in | Wt 167.0 lb

## 2016-07-24 DIAGNOSIS — Z0000 Encounter for general adult medical examination without abnormal findings: Secondary | ICD-10-CM

## 2016-07-24 NOTE — Patient Instructions (Signed)
Breast Tenderness Breast tenderness is a common problem for women of all ages. Breast tenderness may cause mild discomfort to severe pain. The pain usually comes and goes in association with your menstrual cycle, but it can be constant. Breast tenderness has many possible causes, including hormone changes and some medicines. Your health care provider may order tests, such as a mammogram or an ultrasound, to check for any unusual findings. Having breast tenderness usually does not mean that you have breast cancer. Follow these instructions at home: Sometimes, reassurance that you do not have breast cancer is all that is needed. In general, follow these home care instructions: Managing pain and discomfort  If directed, apply ice to the area: ? Put ice in a plastic bag. ? Place a towel between your skin and the bag. ? Leave the ice on for 20 minutes, 2-3 times a day.  Make sure you are wearing a supportive bra, especially during exercise. You may also want to wear a supportive bra while sleeping if your breasts are very tender. Medicines  Take over-the-counter and prescription medicines only as told by your health care provider. If the cause of your pain is infection, you may be prescribed an antibiotic medicine.  If you were prescribed an antibiotic, take it as told by your health care provider. Do not stop taking the antibiotic even if you start to feel better. General instructions  Your health care provider may recommend that you reduce the amount of fat in your diet. You can do this by: ? Limiting fried foods. ? Cooking foods using methods, such as baking, boiling, grilling, and broiling.  Decrease the amount of caffeine in your diet. You can do this by drinking more water and choosing caffeine-free options.  Keep a log of the days and times when your breasts are most tender.  Ask your health care provider how to do breast exams at home. This will help you notice if you have an unusual  growth or lump. Contact a health care provider if:  Any part of your breast is hard, red, and hot to the touch. This may be a sign of infection.  You are not breastfeeding and you have fluid, especially blood or pus, coming out of your nipples.  You have a fever.  You have a new or painful lump in your breast that remains after your menstrual period ends.  Your pain does not improve or it gets worse.  Your pain is interfering with your daily activities. This information is not intended to replace advice given to you by your health care provider. Make sure you discuss any questions you have with your health care provider. Document Released: 12/14/2007 Document Revised: 09/29/2015 Document Reviewed: 09/29/2015 Elsevier Interactive Patient Education  2018 [employer]. Hypertension Hypertension, commonly called high blood pressure, is when the force of blood pumping through the arteries is too strong. The arteries are the blood vessels that carry blood from the heart throughout the body. Hypertension forces the heart to work harder to pump blood and may cause arteries to become narrow or stiff. Having untreated or uncontrolled hypertension can cause heart attacks, strokes, kidney disease, and other problems. A blood pressure reading consists of a higher number over a lower number. Ideally, your blood pressure should be below 120/80. The first ("top") number is called the systolic pressure. It is a measure of the pressure in your arteries as your heart beats. The second ("bottom") number is called the diastolic pressure. It is a measure  of the pressure in your arteries as the heart relaxes. What are the causes? The cause of this condition is not known. What increases the risk? Some risk factors for high blood pressure are under your control. Others are not. Factors you can change  Smoking.  Having type 2 diabetes mellitus, high cholesterol, or both.  Not getting enough exercise or physical  activity.  Being overweight.  Having too much fat, sugar, calories, or salt (sodium) in your diet.  Drinking too much alcohol. Factors that are difficult or impossible to change  Having chronic kidney disease.  Having a family history of high blood pressure.  Age. Risk increases with age.  Race. You may be at higher risk if you are African-American.  Gender. Men are at higher risk than women before age 4. After age 24, women are at higher risk than men.  Having obstructive sleep apnea.  Stress. What are the signs or symptoms? Extremely high blood pressure (hypertensive crisis) may cause:  Headache.  Anxiety.  Shortness of breath.  Nosebleed.  Nausea and vomiting.  Severe chest pain.  Jerky movements you cannot control (seizures).  How is this diagnosed? This condition is diagnosed by measuring your blood pressure while you are seated, with your arm resting on a surface. The cuff of the blood pressure monitor will be placed directly against the skin of your upper arm at the level of your heart. It should be measured at least twice using the same arm. Certain conditions can cause a difference in blood pressure between your right and left arms. Certain factors can cause blood pressure readings to be lower or higher than normal (elevated) for a short period of time:  When your blood pressure is higher when you are in a health care provider's office than when you are at home, this is called white coat hypertension. Most people with this condition do not need medicines.  When your blood pressure is higher at home than when you are in a health care provider's office, this is called masked hypertension. Most people with this condition may need medicines to control blood pressure.  If you have a high blood pressure reading during one visit or you have normal blood pressure with other risk factors:  You may be asked to return on a different day to have your blood pressure  checked again.  You may be asked to monitor your blood pressure at home for 1 week or longer.  If you are diagnosed with hypertension, you may have other blood or imaging tests to help your health care provider understand your overall risk for other conditions. How is this treated? This condition is treated by making healthy lifestyle changes, such as eating healthy foods, exercising more, and reducing your alcohol intake. Your health care provider may prescribe medicine if lifestyle changes are not enough to get your blood pressure under control, and if:  Your systolic blood pressure is above 130.  Your diastolic blood pressure is above 80.  Your personal target blood pressure may vary depending on your medical conditions, your age, and other factors. Follow these instructions at home: Eating and drinking  Eat a diet that is high in fiber and potassium, and low in sodium, added sugar, and fat. An example eating plan is called the DASH (Dietary Approaches to Stop Hypertension) diet. To eat this way: ? Eat plenty of fresh fruits and vegetables. Try to fill half of your plate at each meal with fruits and vegetables. ? Eat whole  grains, such as whole wheat pasta, brown rice, or whole grain bread. Fill about one quarter of your plate with whole grains. ? Eat or drink low-fat dairy products, such as skim milk or low-fat yogurt. ? Avoid fatty cuts of meat, processed or cured meats, and poultry with skin. Fill about one quarter of your plate with lean proteins, such as fish, chicken without skin, beans, eggs, and tofu. ? Avoid premade and processed foods. These tend to be higher in sodium, added sugar, and fat.  Reduce your daily sodium intake. Most people with hypertension should eat less than 1,500 mg of sodium a day.  Limit alcohol intake to no more than 1 drink a day for nonpregnant women and 2 drinks a day for men. One drink equals 12 oz of beer, 5 oz of wine, or 1 oz of hard  liquor. Lifestyle  Work with your health care provider to maintain a healthy body weight or to lose weight. Ask what an ideal weight is for you.  Get at least 30 minutes of exercise that causes your heart to beat faster (aerobic exercise) most days of the week. Activities may include walking, swimming, or biking.  Include exercise to strengthen your muscles (resistance exercise), such as pilates or lifting weights, as part of your weekly exercise routine. Try to do these types of exercises for 30 minutes at least 3 days a week.  Do not use any products that contain nicotine or tobacco, such as cigarettes and e-cigarettes. If you need help quitting, ask your health care provider.  Monitor your blood pressure at home as told by your health care provider.  Keep all follow-up visits as told by your health care provider. This is important. Medicines  Take over-the-counter and prescription medicines only as told by your health care provider. Follow directions carefully. Blood pressure medicines must be taken as prescribed.  Do not skip doses of blood pressure medicine. Doing this puts you at risk for problems and can make the medicine less effective.  Ask your health care provider about side effects or reactions to medicines that you should watch for. Contact a health care provider if:  You think you are having a reaction to a medicine you are taking.  You have headaches that keep coming back (recurring).  You feel dizzy.  You have swelling in your ankles.  You have trouble with your vision. Get help right away if:  You develop a severe headache or confusion.  You have unusual weakness or numbness.  You feel faint.  You have severe pain in your chest or abdomen.  You vomit repeatedly.  You have trouble breathing. Summary  Hypertension is when the force of blood pumping through your arteries is too strong. If this condition is not controlled, it may put you at risk for serious  complications.  Your personal target blood pressure may vary depending on your medical conditions, your age, and other factors. For most people, a normal blood pressure is less than 120/80.  Hypertension is treated with lifestyle changes, medicines, or a combination of both. Lifestyle changes include weight loss, eating a healthy, low-sodium diet, exercising more, and limiting alcohol. This information is not intended to replace advice given to you by your health care provider. Make sure you discuss any questions you have with your health care provider. Document Released: 12/31/2004 Document Revised: 11/29/2015 Document Reviewed: 11/29/2015 Elsevier Interactive Patient Education  2018 [employer] HPV Test The human papillomavirus (HPV) test is used to look for  high-risk types of HPV infection. HPV is a group of about 100 viruses. Many of these viruses cause growths on, in, or around the genitals. Most HPV viruses cause infections that usually go away without treatment. However, HPV types 6, 11, 16, and 18 are considered high-risk types of HPV that can increase your risk of cancer of the cervix or anus if the infection is left untreated. An HPV test identifies the DNA (genetic) strands of the HPV infection, so it is also referred to as the HPV DNA test. Although HPV is found in both males and females, the HPV test is only used to screen for increased cancer risk in females:  With an abnormal Pap test.  After treatment of an abnormal Pap test.  Between the ages of 88 and 39.  After treatment of a high-risk HPV infection.  The HPV test may be done at the same time as a pelvic exam and Pap test in females over the age of 53. Both the HPV test and Pap test require a sample of cells from the cervix. How do I prepare for this test?  Do not douche or take a bath for 24-48 hours before the test or as directed by your health care provider.  Do not have sex for 24-48 hours before the test or as  directed by your health care provider.  You may be asked to reschedule the test if you are menstruating.  You will be asked to urinate before the test. What do the results mean? It is your responsibility to obtain your test results. Ask the lab or department performing the test when and how you will get your results. Talk with your health care provider if you have any questions about your results. Your result will be negative or positive. Meaning of Negative Test Results A negative HPV test result means that no HPV was found, and it is very likely that you do not have HPV. Meaning of Positive Test Results A positive HPV test result indicates that you have HPV.  If your test result shows the presence of any high-risk HPV strains, you may have an increased risk of developing cancer of the cervix or anus if the infection is left untreated.  If any low-risk HPV strains are found, you are not likely to have an increased risk of cancer.  Discuss your test results with your health care provider. He or she will use the results to make a diagnosis and determine a treatment plan that is right for you. Talk with your health care provider to discuss your results, treatment options, and if necessary, the need for more tests. Talk with your health care provider if you have any questions about your results. This information is not intended to replace advice given to you by your health care provider. Make sure you discuss any questions you have with your health care provider. Document Released: 01/26/2004 Document Revised: 09/06/2015 Document Reviewed: 05/18/2013 Elsevier Interactive Patient Education  2017 [employer].

## 2016-07-25 ENCOUNTER — Encounter: Payer: Self-pay | Admitting: *Deleted

## 2016-07-25 NOTE — Progress Notes (Signed)
Subjective:     Patient ID: Evelyn RankinsRobin Simon, female   DOB: [birth year], 61 y.o.   MRN: [id number]  HPI   Review of Systems     Objective:   Physical Exam  Pulmonary/Chest: Right breast exhibits no inverted nipple, no mass, no nipple discharge, no skin change and no tenderness. Left breast exhibits skin change. Left breast exhibits no inverted nipple, no mass, no nipple discharge and no tenderness. Breasts are symmetrical.    Abdominal: There is no splenomegaly or hepatomegaly.    Genitourinary: No labial fusion. There is no rash, tenderness, lesion or injury on the right labia. There is no rash, tenderness, lesion or injury on the left labia. Cervix exhibits no motion tenderness, no discharge and no friability. Right adnexum displays no mass, no tenderness and no fullness. Left adnexum displays no mass, no tenderness and no fullness. No erythema, tenderness or bleeding in the vagina. No foreign body in the vagina. No signs of injury around the vagina. No vaginal discharge found.       Assessment:     61 year old White [facility] of bilateral breast pain and redness on her left breast.  Patient states she has had bilateral breast and axilla pain for years.  States she has had mammograms, ultrasounds and an MRI that were all normal.  States she drinks approximately 4 cups of caffeine a day. Last menstrual cycle was in 2003.  On visual inspection of the breast there is a very faint light red U shaped area in upper inner quadrant of the left breast, and a very faint light red 2 cm area in the lower inner quadrant of the left breast.  Patient states the area in the upper quadrant has been present for about a month, and the area in the lower quadrant, she just noticed today.  Denies any irritation from her bra.  There is no associated erythema or pain noted. On clinical breast exam there is no dominant mass, nipple discharge, or lymphadenopathy. The area of redness is  unimpressive for inflammation.  Encouraged the patient to watch both areas for increased redness, warmth, or edema, and report to her primary care provider or back to [facility] if these occur.  She has also been encouraged to decrease caffeine intake to reduce breast and axillary pain.  Hand-out on breast tenderness given to the patient.  Specimen colllected for pap smear without difficulty.  Blood pressure elevated at 176/119.  I rechecked her blood pressure at 157/96.  She was encouraged to go to the ED, but refused.  States she has metotoprolol at home and will take some.  States she had taken herself off of her blood pressure meds. She is to recheck her blood pressure at [facility] or [facility], and if remains higher than 140/90 she is to follow-up with her primary care provider.  She was encouraged to go to primary care for further evaluation.  Discussed side effects of uncontrolled hypertension.  Hand out on hypertention given to patient.  Patient has been screened for eligibility.  She does not have any insurance, Medicare or Medicaid.  She also meets financial eligibility.  Hand-out given on the Affordable Care Act.    Plan:     Bilateral screening mammogram ordered.  Specimen for pap smear sent to the lab.  Will follow-up per [facility] protocol.

## 2016-07-26 LAB — PAP LB AND HPV HIGH-RISK
HPV, HIGH-RISK: NEGATIVE
PAP Smear Comment: 0

## 2016-07-29 ENCOUNTER — Other Ambulatory Visit: Payer: Self-pay | Admitting: *Deleted

## 2016-07-29 ENCOUNTER — Inpatient Hospital Stay
Admission: RE | Admit: 2016-07-29 | Discharge: 2016-07-29 | Disposition: A | Discharge status: Home / Self Care | Payer: Self-pay | Source: Ambulatory Visit | Attending: *Deleted | Admitting: *Deleted

## 2016-07-29 DIAGNOSIS — Z9289 Personal history of other medical treatment: Secondary | ICD-10-CM

## 2016-08-08 ENCOUNTER — Encounter: Payer: Self-pay | Admitting: *Deleted

## 2016-08-08 NOTE — Progress Notes (Signed)
Letter mailed to inform patient of her normal mammogram and pap smear.  Next mammogram due in one year and pap smear due in 5 years.  [facility] [place].

## 2016-08-15 ENCOUNTER — Encounter: Payer: Self-pay | Admitting: *Deleted

## 2016-08-15 DIAGNOSIS — Z0000 Encounter for general adult medical examination without abnormal findings: Secondary | ICD-10-CM | POA: Insufficient documentation

## 2016-08-28 ENCOUNTER — Ambulatory Visit: Payer: Self-pay | Admitting: General Surgery

## 2016-09-02 ENCOUNTER — Ambulatory Visit: Payer: Self-pay | Admitting: Surgery

## 2016-10-18 ENCOUNTER — Emergency Department: Payer: BLUE CROSS/BLUE SHIELD

## 2016-10-18 ENCOUNTER — Emergency Department
Admission: EM | Admit: 2016-10-18 | Discharge: 2016-10-18 | Disposition: A | Discharge status: Home / Self Care | Payer: BLUE CROSS/BLUE SHIELD | Attending: Emergency Medicine | Admitting: Emergency Medicine

## 2016-10-18 ENCOUNTER — Encounter: Payer: Self-pay | Admitting: Emergency Medicine

## 2016-10-18 DIAGNOSIS — K5732 Diverticulitis of large intestine without perforation or abscess without bleeding: Secondary | ICD-10-CM | POA: Insufficient documentation

## 2016-10-18 DIAGNOSIS — Z79899 Other long term (current) drug therapy: Secondary | ICD-10-CM | POA: Insufficient documentation

## 2016-10-18 DIAGNOSIS — E039 Hypothyroidism, unspecified: Secondary | ICD-10-CM | POA: Insufficient documentation

## 2016-10-18 DIAGNOSIS — R1030 Lower abdominal pain, unspecified: Secondary | ICD-10-CM | POA: Diagnosis present

## 2016-10-18 DIAGNOSIS — J45909 Unspecified asthma, uncomplicated: Secondary | ICD-10-CM | POA: Insufficient documentation

## 2016-10-18 DIAGNOSIS — Z87891 Personal history of nicotine dependence: Secondary | ICD-10-CM | POA: Insufficient documentation

## 2016-10-18 DIAGNOSIS — Z91040 Latex allergy status: Secondary | ICD-10-CM | POA: Diagnosis not present

## 2016-10-18 LAB — URINALYSIS, COMPLETE (UACMP) WITH MICROSCOPIC
Bacteria, UA: NONE SEEN
Bilirubin Urine: NEGATIVE
Glucose, UA: NEGATIVE mg/dL
Hgb urine dipstick: NEGATIVE
Ketones, ur: NEGATIVE mg/dL
NITRITE: NEGATIVE
PH: 6 (ref 5.0–8.0)
Protein, ur: NEGATIVE mg/dL
SPECIFIC GRAVITY, URINE: 1.009 (ref 1.005–1.030)

## 2016-10-18 LAB — CBC
HCT: 38.6 % (ref 35.0–47.0)
HEMOGLOBIN: 13.3 g/dL (ref 12.0–16.0)
MCH: 32.5 pg (ref 26.0–34.0)
MCHC: 34.4 g/dL (ref 32.0–36.0)
MCV: 94.3 fL (ref 80.0–100.0)
PLATELETS: 411 10*3/uL (ref 150–440)
RBC: 4.09 MIL/uL (ref 3.80–5.20)
RDW: 12.4 % (ref 11.5–14.5)
WBC: 7 10*3/uL (ref 3.6–11.0)

## 2016-10-18 LAB — COMPREHENSIVE METABOLIC PANEL
ALK PHOS: 87 U/L (ref 38–126)
ALT: 35 U/L (ref 14–54)
ANION GAP: 11 (ref 5–15)
AST: 38 U/L (ref 15–41)
Albumin: 4.3 g/dL (ref 3.5–5.0)
BUN: 10 mg/dL (ref 6–20)
CALCIUM: 9.3 mg/dL (ref 8.9–10.3)
CO2: 27 mmol/L (ref 22–32)
CREATININE: 0.71 mg/dL (ref 0.44–1.00)
Chloride: 100 mmol/L — ABNORMAL LOW (ref 101–111)
GFR calc Af Amer: >60 mL/min (ref >60)
Glucose, Bld: 104 mg/dL — ABNORMAL HIGH (ref 65–99)
Potassium: 4.2 mmol/L (ref 3.5–5.1)
SODIUM: 138 mmol/L (ref 135–145)
TOTAL PROTEIN: 7.8 g/dL (ref 6.5–8.1)
Total Bilirubin: 0.4 mg/dL (ref 0.3–1.2)

## 2016-10-18 LAB — LIPASE, BLOOD: Lipase: 34 U/L (ref 11–51)

## 2016-10-18 MED ORDER — METRONIDAZOLE 500 MG PO TABS: 500.0000 mg | ORAL_TABLET | Freq: Three times a day (TID) | ORAL | 0 refills | Status: AC

## 2016-10-18 MED ORDER — AMOXICILLIN-POT CLAVULANATE 875-125 MG PO TABS
1.0000 | ORAL_TABLET | Freq: Once | ORAL | Status: AC
  Administered 2016-10-18: 1 via ORAL
  Filled 2016-10-18: qty 1

## 2016-10-18 MED ORDER — METRONIDAZOLE 500 MG PO TABS
500.0000 mg | ORAL_TABLET | Freq: Once | ORAL | Status: AC
  Administered 2016-10-18: 500 mg via ORAL
  Filled 2016-10-18: qty 1

## 2016-10-18 MED ORDER — IOPAMIDOL (ISOVUE-370) INJECTION 76%
75.0000 mL | Freq: Once | INTRAVENOUS | Status: AC | PRN
  Administered 2016-10-18: 75 mL via INTRAVENOUS
  Filled 2016-10-18: qty 75

## 2016-10-18 MED ORDER — HYDROCODONE-ACETAMINOPHEN 5-325 MG PO TABS: 1.0000 | ORAL_TABLET | Freq: Four times a day (QID) | ORAL | 0 refills | Status: DC | PRN

## 2016-10-18 MED ORDER — SODIUM CHLORIDE 0.9 % IV BOLUS (SEPSIS)
1000.0000 mL | Freq: Once | INTRAVENOUS | Status: AC
  Administered 2016-10-18: 1000 mL via INTRAVENOUS

## 2016-10-18 MED ORDER — IOPAMIDOL (ISOVUE-300) INJECTION 61%
100.0000 mL | Freq: Once | INTRAVENOUS | Status: DC | PRN
  Filled 2016-10-18: qty 100

## 2016-10-18 MED ORDER — DOCUSATE SODIUM 100 MG PO CAPS: 100.0000 mg | ORAL_CAPSULE | Freq: Two times a day (BID) | ORAL | 0 refills | Status: DC

## 2016-10-18 MED ORDER — AMOXICILLIN-POT CLAVULANATE 875-125 MG PO TABS: 1.0000 | ORAL_TABLET | Freq: Two times a day (BID) | ORAL | 0 refills | Status: DC

## 2016-10-18 NOTE — ED Triage Notes (Signed)
Pt to ED via POV from [facility] for abdominal pain since Tuesday. Pt states that she is having bilateral lower abd pain, abdomen is only tender to touch now. Pt also reports nausea and 1 episode of diarrhea, as well as gas and bloating. Pt states that she started a juice cleanse on Tuesday and is unsure if this is possible related. Pt was sent over from [facility] for further evaluation.   Pt in NAD at this time.

## 2016-10-18 NOTE — ED Provider Notes (Signed)
[facility] Emergency Department Provider Note  ____________________________________________   First MD Initiated Contact with Patient 10/18/16 1428     (approximate)  I have reviewed the triage vital signs and the nursing notes.   HISTORY  Chief Complaint Abdominal Pain    HPI Evelyn Simon is a 61 y.o. female who self presents to the emergency Department with roughly 4 days of lower abdominal discomfort. The pain is mild to moderate nearly constant aching and throbbing. She denies vomiting but has had some nausea. One episode of loose stools. She has felt warm but has not checked temperature. She did drink juice that she made on Tuesday although has not been doing a cleanse. She has no history of abdominal surgeries.  Nothing in particular seems to make her pain better or worse.   Past Medical History:  Diagnosis Date  . Arthritis   . Asthma   . GERD (gastroesophageal reflux disease)   . Headache    H/O MIGRAINES  . Hypothyroidism     Patient Active Problem List   Diagnosis Date Noted  . Dyspepsia   . Diarrhea   . Diverticulosis of large intestine without diverticulitis     Past Surgical History:  Procedure Laterality Date  . CESAREAN SECTION     X3  . COLONOSCOPY WITH PROPOFOL N/A 12/21/2015   Procedure: COLONOSCOPY WITH PROPOFOL;  Surgeon: Wyline Mood, MD;  Location: [facility] ENDOSCOPY;  Service: Endoscopy;  Laterality: N/A;  . DIAGNOSTIC LAPAROSCOPY    . DILATION AND CURETTAGE OF UTERUS    . ESOPHAGOGASTRODUODENOSCOPY (EGD) WITH PROPOFOL N/A 12/21/2015   Procedure: ESOPHAGOGASTRODUODENOSCOPY (EGD) WITH PROPOFOL;  Surgeon: Wyline Mood, MD;  Location: [facility] ENDOSCOPY;  Service: Endoscopy;  Laterality: N/A;  . ESOPHAGOGASTRODUODENOSCOPY (EGD) WITH PROPOFOL N/A 01/02/2016   Procedure: ESOPHAGOGASTRODUODENOSCOPY (EGD) WITH PROPOFOL;  Surgeon: Midge Minium, MD;  Location: [facility] ENDOSCOPY;  Service: Endoscopy;  Laterality: N/A;  . EXCISION MORTON'S  NEUROMA Right   . FINGER ARTHROPLASTY Left 08/21/2015   Procedure: LEFT THUMB  ARTHROPLASTY (CMC);  Surgeon: Deeann Saint, MD;  Location: [facility] ORS;  Service: Orthopedics;  Laterality: Left;  . TUBAL LIGATION    . TUMMY TUCK  2014    Prior to Admission medications   Medication Sig Start Date End Date Taking? Authorizing Provider  amoxicillin-clavulanate (AUGMENTIN) 875-125 MG tablet Take 1 tablet by mouth 2 (two) times daily. 10/18/16 11/01/16  Merrily Brittle, MD  BIOTIN PO Take 1 tablet by mouth daily.    [provider]  BLACK COHOSH PO Take 1 tablet by mouth daily.    [provider]  cetirizine (ZYRTEC) 10 MG tablet Take 10 mg by mouth every morning.    [provider]  docusate sodium (COLACE) 100 MG capsule Take 1 capsule (100 mg total) by mouth 2 (two) times daily. 10/18/16 10/18/17  Merrily Brittle, MD  doxycycline (VIBRAMYCIN) 100 MG capsule Take 1 capsule (100 mg total) by mouth 2 (two) times daily. 01/03/16   Wyline Mood, MD  gabapentin (NEURONTIN) 400 MG capsule Take 1 capsule (400 mg total) by mouth 3 (three) times daily. 08/21/15   Deeann Saint, MD  HYDROcodone-acetaminophen (NORCO) 5-325 MG tablet Take 1 tablet by mouth every 6 (six) hours as needed for severe pain. 10/18/16   Merrily Brittle, MD  levothyroxine (SYNTHROID, LEVOTHROID) 50 MCG tablet  10/12/15   [provider]  metroNIDAZOLE (FLAGYL) 500 MG tablet Take 1 tablet (500 mg total) by mouth 3 (three) times daily. 10/18/16 11/01/16  Merrily Brittle,  MD  milk thistle 175 MG tablet Take 175 mg by mouth daily.    [provider]  Misc Natural Products (PUMPKIN SEED OIL) CAPS Take 1 capsule by mouth daily.    [provider]  omeprazole (PRILOSEC) 20 MG capsule Take 20 mg by mouth every morning.    [provider]  Papaya TABS Take 1 tablet by mouth daily.    [provider]  traMADol [name]) 50 MG tablet  10/13/15   [provider]  UNKNOWN TO  PATIENT PT IS STARTING A THYROID PILL BUT HAS NOT PICKED IT UP FROM HER PHARMACY YET (08-10-15)    [provider]  VENTOLIN HFA 108 (90 Base) MCG/ACT inhaler inhale 2 puffs by mouth PRN 07/26/15   [provider]    Allergies Latex; Crestor [rosuvastatin]; Other; Pineapple; Soy allergy; and Ultram [tramadol]  Family History  Problem Relation Age of Onset  . Breast cancer Maternal Aunt     Social History Social History  Substance Use Topics  . Smoking status: Former Smoker    Types: Cigarettes  . Smokeless tobacco: Never Used  . Alcohol use Yes     Comment: RED WINE DAILY    Review of Systems Constitutional: No fever/chills Eyes: No visual changes. ENT: No sore throat. Cardiovascular: Denies chest pain. Respiratory: Denies shortness of breath. Gastrointestinal: positive for abdominal pain.  positive for nausea, no vomiting.  positive for diarrhea.  No constipation. Genitourinary: Negative for dysuria. Musculoskeletal: Negative for back pain. Skin: Negative for rash. Neurological: Negative for headaches, focal weakness or numbness.   ____________________________________________   PHYSICAL EXAM:  VITAL SIGNS: ED Triage Vitals  Enc Vitals Group     BP 10/18/16 1211 (!) 151/75     Pulse Rate 10/18/16 1211 71     Resp 10/18/16 1211 16     Temp 10/18/16 1211 98.2 F (36.8 C)     Temp Source 10/18/16 1211 Oral     SpO2 10/18/16 1211 98 %     Weight 10/18/16 1212 170 lb (77.1 kg)     Height 10/18/16 1212  (1.575 m)     Head Circumference --      Peak Flow --      Pain Score 10/18/16 1211 2     Pain Loc --      Pain Edu? --      Excl. in GC? --     Constitutional: Alert and oriented 4 appears somewhat uncomfortable nontoxic no diaphoresis speaks in full clear sentences Eyes: PERRL EOMI. Head: Atraumatic. Nose: No congestion/rhinnorhea. Mouth/Throat: No trismus Neck: No stridor.   Cardiovascular: Normal rate, regular rhythm. Grossly normal  heart sounds.  Good peripheral circulation. Respiratory: Normal respiratory effort.  No retractions. Lungs CTAB and moving good air Gastrointestinal: Soft nondistended quite tender right lower quadrant greater than left lower quadrant with rebound and voluntary guarding although no frank peritonitis Musculoskeletal: No lower extremity edema   Neurologic:  Normal speech and language. No gross focal neurologic deficits are appreciated. Skin:  Skin is warm, dry and intact. No rash noted. Psychiatric: Mood and affect are normal. Speech and behavior are normal.    ____________________________________________   DIFFERENTIAL includes but not limited to  Appendicitis, diverticulitis, ovarian torsion, pyelonephritis, nephrolithiasis ____________________________________________   LABS (all labs ordered are listed, but only abnormal results are displayed)  Labs Reviewed  COMPREHENSIVE METABOLIC PANEL - Abnormal; Notable for the following:       Result Value   Chloride 100 (*)  Glucose, Bld 104 (*)    All other components within normal limits  URINALYSIS, COMPLETE (UACMP) WITH MICROSCOPIC - Abnormal; Notable for the following:    Color, Urine YELLOW (*)    APPearance HAZY (*)    Leukocytes, UA TRACE (*)    Squamous Epithelial / LPF 6-30 (*)    All other components within normal limits  LIPASE, BLOOD  CBC   blood work reviewed and interpreted by me as normal the urinalysis is a dirty sample but no evidence of infection __________________________________________  EKG   ____________________________________________  RADIOLOGY  CT scan reviewed by me shows simple non-perforated sigmoid diverticulitis_____________________________________   PROCEDURES  Procedure(s) performed: no  Procedures  Critical Care performed: no  Observation: no ____________________________________________   INITIAL IMPRESSION / ASSESSMENT AND PLAN / ED COURSE  Pertinent labs & imaging results that  were available during my care of the patient were reviewed by me and considered in my medical decision making (see chart for details).  The patient is well-appearing and hemodynamically stable although now with 4 days of vague abdominal symptoms and exquisite tenderness and rebound in her right lower quadrant. She requires a CT scan with IV contrast to evaluate for appendicitis.    ----------------------------------------- 4:09 PM on 10/18/2016 -----------------------------------------  The patient remains very well-appearing and is able to eat and drink. She is hemodynamically stable. Her CT scan shows simple nonperforated sigmoid diverticulitis without complication. I've given her first dose of oral antibiotics here as well as 2 weeks outpatient. I will refer her to general surgery as needed for follow-up and strict return precautions are given. The patient verbalizes understanding and agreement with the plan.  ____________________________________________   FINAL CLINICAL IMPRESSION(S) / ED DIAGNOSES  Final diagnoses:  Sigmoid diverticulitis      NEW MEDICATIONS STARTED DURING THIS VISIT:  Discharge Medication List as of 10/18/2016  4:07 PM    START taking these medications   Details  amoxicillin-clavulanate (AUGMENTIN) 875-125 MG tablet Take 1 tablet by mouth 2 (two) times daily., Starting Fri 10/18/2016, Until Fri 11/01/2016, Print    docusate sodium (COLACE) 100 MG capsule Take 1 capsule (100 mg total) by mouth 2 (two) times daily., Starting Fri 10/18/2016, Until Sat 10/18/2017, Print    metroNIDAZOLE (FLAGYL) 500 MG tablet Take 1 tablet (500 mg total) by mouth 3 (three) times daily., Starting Fri 10/18/2016, Until Fri 11/01/2016, Print         Note:  This document was prepared using Dragon voice recognition software and may include unintentional dictation errors.     Merrily Brittle, MD 10/19/16 1407

## 2016-10-18 NOTE — Discharge Instructions (Signed)
Please take all of your antibiotics as prescribed and make an appointment to follow-up with the general surgeon as needed. Return to the emergency department sooner for any new or worsening symptoms such as fevers, chills, worsening pain, if you cannot eat or drink, or for any other concerns whatsoever.  It was a pleasure to take care of you today, and thank you for coming to our emergency department.  If you have any questions or concerns before leaving please ask the nurse to grab me and I'm more than happy to go through your aftercare instructions again.  If you were prescribed any opioid pain medication today such as Norco, Vicodin, Percocet, morphine, hydrocodone, or oxycodone please make sure you do not drive when you are taking this medication as it can alter your ability to drive safely.  If you have any concerns once you are home that you are not improving or are in fact getting worse before you can make it to your follow-up appointment, please do not hesitate to call 911 and come back for further evaluation.  Merrily Brittle, MD  Results for orders placed or performed during the hospital encounter of 10/18/16  Lipase, blood  Result Value Ref Range   Lipase 34 11 - 51 U/L  Comprehensive metabolic panel  Result Value Ref Range   Sodium 138 135 - 145 mmol/L   Potassium 4.2 3.5 - 5.1 mmol/L   Chloride 100 (L) 101 - 111 mmol/L   CO2 27 22 - 32 mmol/L   Glucose, Bld 104 (H) 65 - 99 mg/dL   BUN 10 6 - 20 mg/dL   Creatinine, Ser 1.61 0.44 - 1.00 mg/dL   Calcium 9.3 8.9 - 09.6 mg/dL   Total Protein 7.8 6.5 - 8.1 g/dL   Albumin 4.3 3.5 - 5.0 g/dL   AST 38 15 - 41 U/L   ALT 35 14 - 54 U/L   Alkaline Phosphatase 87 38 - 126 U/L   Total Bilirubin 0.4 0.3 - 1.2 mg/dL   GFR calc non Af Amer >60 >60 mL/min   GFR calc Af Amer >60 >60 mL/min   Anion gap 11 5 - 15  CBC  Result Value Ref Range   WBC 7.0 3.6 - 11.0 K/uL   RBC 4.09 3.80 - 5.20 MIL/uL   Hemoglobin 13.3 12.0 - 16.0 g/dL   HCT  04.5 40.9 - 81.1 %   MCV 94.3 80.0 - 100.0 fL   MCH 32.5 26.0 - 34.0 pg   MCHC 34.4 32.0 - 36.0 g/dL   RDW 91.4 78.2 - 95.6 %   Platelets 411 150 - 440 K/uL  Urinalysis, Complete w Microscopic  Result Value Ref Range   Color, Urine YELLOW (A) YELLOW   APPearance HAZY (A) CLEAR   Specific Gravity, Urine 1.009 1.005 - 1.030   pH 6.0 5.0 - 8.0   Glucose, UA NEGATIVE NEGATIVE mg/dL   Hgb urine dipstick NEGATIVE NEGATIVE   Bilirubin Urine NEGATIVE NEGATIVE   Ketones, ur NEGATIVE NEGATIVE mg/dL   Protein, ur NEGATIVE NEGATIVE mg/dL   Nitrite NEGATIVE NEGATIVE   Leukocytes, UA TRACE (A) NEGATIVE   RBC / HPF 0-5 0 - 5 RBC/hpf   WBC, UA 0-5 0 - 5 WBC/hpf   Bacteria, UA NONE SEEN NONE SEEN   Squamous Epithelial / LPF 6-30 (A) NONE SEEN   Mucus PRESENT    Ct Abdomen Pelvis W Contrast  Result Date: 10/18/2016 CLINICAL DATA:  Bilateral lower abdominal pain EXAM: CT ABDOMEN AND  PELVIS WITH CONTRAST TECHNIQUE: Multidetector CT imaging of the abdomen and pelvis was performed using the standard protocol following bolus administration of intravenous contrast. CONTRAST:  75 mL Isovue 370 intravenous COMPARISON:  Ultrasound 01/30/2013 CT chest 09/01/2015 FINDINGS: Lower chest: Decreased size of a left lower lobe subpleural nodule measuring 4 mm compared to 7 mm previously. No acute consolidation or pleural effusion. Normal heart size. Mitral calcification. Hepatobiliary: Mild focal fat infiltration near the falciform ligament. No calcified gallstones. No biliary dilatation. Subcentimeter hypodensity near the gallbladder fossa too small to further characterize Pancreas: Unremarkable. No pancreatic ductal dilatation or surrounding inflammatory changes. Spleen: Normal in size without focal abnormality. Adrenals/Urinary Tract: Adrenal glands are unremarkable. Kidneys are normal, without renal calculi, focal lesion, or hydronephrosis. Bladder is unremarkable. Stomach/Bowel: Stomach is nonenlarged. No dilated small  bowel. Normal appendix. Focal wall thickening and surrounding mild to moderate inflammation in the distal sigmoid colon with diverticula present. No extraluminal gas. Vascular/Lymphatic: Aortic atherosclerosis. No enlarged abdominal or pelvic lymph nodes. Reproductive: Uterus and bilateral adnexa are unremarkable. Other: Negative for free air or free fluid. Musculoskeletal: No acute or significant osseous findings. IMPRESSION: 1. Focal wall thickening and associated mild to moderate inflammation involving the distal sigmoid colon, felt most consistent with acute diverticulitis. No extraluminal gas to suggest perforation. No abscess. 2. Interval decrease in size of a left lower lobe subpleural pulmonary nodule measuring 4 mm compared to 7 mm previously, likely benign Electronically Signed   By: Jasmine Pang M.D.   On: 10/18/2016 15:40

## 2016-10-18 NOTE — ED Notes (Signed)
AAOx3.  Skin warm and dry.  NAD.  Posture upright and relaxed.

## 2016-10-18 NOTE — ED Triage Notes (Signed)
First Nurse Note:  Arrives from [facility] for ED evaluation.  C/O lower abdominal pain and difficulty urinating x 1 week.  Patient AAOx3.  Skin warm and dry. NAD.

## 2016-10-28 ENCOUNTER — Encounter: Payer: Self-pay | Admitting: General Surgery

## 2016-10-28 ENCOUNTER — Ambulatory Visit (INDEPENDENT_AMBULATORY_CARE_PROVIDER_SITE_OTHER): Payer: BLUE CROSS/BLUE SHIELD | Admitting: General Surgery

## 2016-10-28 VITALS — BP 151/88 | HR 67 | Temp 97.8°F | Wt 174.0 lb

## 2016-10-28 DIAGNOSIS — K5732 Diverticulitis of large intestine without perforation or abscess without bleeding: Secondary | ICD-10-CM | POA: Diagnosis not present

## 2016-10-28 DIAGNOSIS — K219 Gastro-esophageal reflux disease without esophagitis: Secondary | ICD-10-CM | POA: Diagnosis not present

## 2016-10-28 NOTE — Patient Instructions (Signed)
Please finish your antibiotics to make sure that your infection is gone.  After you are done with your antibiotics, please start taking Metamucil once a day.  We will see you back in three weeks to discuss possible surgery.   Diverticulitis Diverticulitis is when small pockets in your large intestine (colon) get infected or swollen. This causes stomach pain and watery poop (diarrhea). These pouches are called diverticula. They form in people who have a condition called diverticulosis. Follow these instructions at home: Medicines  Take over-the-counter and prescription medicines only as told by your doctor. These include: ? Antibiotics. ? Pain medicines. ? Fiber pills. ? Probiotics. ? Stool softeners.  Do not drive or use heavy machinery while taking prescription pain medicine.  If you were prescribed an antibiotic, take it as told. Do not stop taking it even if you feel better. General instructions  Follow a diet as told by your doctor.  When you feel better, your doctor may tell you to change your diet. You may need to eat a lot of fiber. Fiber makes it easier to poop (have bowel movements). Healthy foods with fiber include: ? Berries. ? Beans. ? Lentils. ? Green vegetables.  Exercise 3 or more times a week. Aim for 30 minutes each time. Exercise enough to sweat and make your heart beat faster.  Keep all follow-up visits as told. This is important. You may need to have an exam of the large intestine. This is called a colonoscopy. Contact a doctor if:  Your pain does not get better.  You have a hard time eating or drinking.  You are not pooping like normal. Get help right away if:  Your pain gets worse.  Your problems do not get better.  Your problems get worse very fast.  You have a fever.  You throw up (vomit) more than one time.  You have poop that is: ? Bloody. ? Black. ? Tarry. Summary  Diverticulitis is when small pockets in your large intestine  (colon) get infected or swollen.  Take medicines only as told by your doctor.  Follow a diet as told by your doctor. This information is not intended to replace advice given to you by your health care provider. Make sure you discuss any questions you have with your health care provider. Document Released: 06/19/2007 Document Revised: 01/18/2016 Document Reviewed: 01/18/2016 Elsevier Interactive Patient Education  2017 [employer]  Laparoscopic Nissen Fundoplication Laparoscopic Nissen fundoplication is surgery to relieve heartburn and other problems caused by gastric fluids flowing up into your esophagus. The esophagus is the tube that carries food and liquid from your throat to your stomach. Normally, the muscle that sits between your stomach and your esophagus (lower esophageal sphincter or LES) keeps stomach fluids in your stomach. In some people, the LES does not work properly, and stomach fluids flow up into the esophagus. This can happen when part of the stomach bulges through the LES (hiatal hernia). The backward flow of stomach fluids can cause a type of severe and long-standing heartburn that is called gastroesophageal reflux disease (GERD). You may need this surgery if other treatments for GERD have not helped. In a laparoscopic Nissen fundoplication, the upper part of your stomach is wrapped around the lower part of your esophagus to strengthen the LES and prevent reflux. If you have a hiatal hernia, it will also be repaired with this surgery. The procedure is done through several small incisions in your abdomen. It is performed using a thin, telescopic instrument (  laparoscope) and other instruments that can pass through the scope or through other small incisions. Tell a health care provider about:  Any allergies you have.  All medicines you are taking, including vitamins, herbs, eye drops, creams, and over-the-counter medicines.  Any problems you or family members have had with  anesthetic medicines.  Any blood disorders you have.  Any surgeries you have had.  Any medical conditions you have. What are the risks? Generally, this is a safe procedure. However, problems may occur, including:  Difficulty swallowing (dysphagia).  Bloating.  Nausea or vomiting.  Damage to the lung, causing a collapsed lung.  Infection or bleeding.  What happens before the procedure?  Ask your health care provider about: ? Changing or stopping your regular medicines. This is especially important if you are taking diabetes medicines or blood thinners. ? Taking medicines such as aspirin and ibuprofen. These medicines can thin your blood. Do not take these medicines before your procedure if your health care provider asks you not to.  Follow your health care provider's instructions about eating or drinking restrictions.  Plan to have someone take you home after the procedure. What happens during the procedure?  An IV tube will be inserted into one of your veins. It will be used to give you fluids and medicines during the procedure.  You will be given a medicine that makes you fall asleep (general anesthetic).  Your abdomen will be cleaned with a germ-killing solution (antiseptic).  The surgeon will make a small incision in your abdomen and insert a tube through the incision.  Your abdomen will be filled with a gas. This helps the surgeon to see your organs more easily and it makes more space to work.  The surgeon will insert the laparoscope through the incision. The scope has a camera that will send pictures to a monitor in the operating room.  The surgeon will make several other small incisions in your abdomen to insert the other instruments that are needed during the procedure.  Another instrument (dilator) will be passed through your mouth and down your esophagus into the upper part of your stomach. The dilator will prevent your LES from being closed too tightly during  surgery.  The surgeon will pass the top portion of your stomach behind the lower part of your esophagus and wrap it all the way around. This will be stitched into place.  If you have a hiatal hernia, it will be repaired during this procedure.  All instruments will be removed, and your incisions will be closed under your skin with stitches (sutures). Skin adhesive strips may also be used.  A bandage (dressing) will be placed on your skin over the incisions. The procedure may vary among health care providers and hospitals. What happens after the procedure?  You will be moved to a recovery area.  Your blood pressure, heart rate, breathing rate, and blood oxygen level will be monitored often until the medicines you were given have worn off.  You will be given pain medicine as needed.  Your IV tube will be kept in until you are able to drink fluids. This information is not intended to replace advice given to you by your health care provider. Make sure you discuss any questions you have with your health care provider. Document Released: 01/21/2014 Document Revised: 06/08/2015 Document Reviewed: 09/01/2013 Elsevier Interactive Patient Education  [employer].

## 2016-10-28 NOTE — Progress Notes (Signed)
Patient ID: Evelyn Simon, female   DOB: 11-19-1955, 61 y.o.   MRN: [id number]  CC: Diverticulitis HPI Evelyn Simon is a 61 y.o. female presents to clinic for ER follow-up of sigmoid diverticulitis. Patient reports she's had 2 bouts of abdominal pain this year that she can now tested diverticulitis. The first bout was 4-6 months ago and was diagnosed via the history and physical without imaging. It was treated with outpatient antibiotics without difficulty. This time she presented to the ER and underwent a CT scan which showed evidence of sigmoid diverticulitis. Patient reports she still on antibiotics and that her pain is almost completely resolved. The originally prescribed antibiotics she was unable to tolerate and had to be changed.she currently denies any fevers, chills, nausea, vomiting, chest pain, shortness of breath diarrhea, constipation. She also reports a history of reflux disease thatshe can no longer tolerate it proton pump inhibitor 4.  HPI  Past Medical History:  Diagnosis Date  . Arthritis   . Asthma   . Diverticulosis of colon   . GERD (gastroesophageal reflux disease)   . Headache    H/O MIGRAINES  . Hypertension   . Hypothyroidism     Past Surgical History:  Procedure Laterality Date  . CESAREAN SECTION     X3  . COLONOSCOPY WITH PROPOFOL N/A 12/21/2015   Procedure: COLONOSCOPY WITH PROPOFOL;  Surgeon: Wyline Mood, MD;  Location: [facility];  Service: Endoscopy;  Laterality: N/A;  . DIAGNOSTIC LAPAROSCOPY    . DILATION AND CURETTAGE OF UTERUS    . ESOPHAGOGASTRODUODENOSCOPY (EGD) WITH PROPOFOL N/A 12/21/2015   Procedure: ESOPHAGOGASTRODUODENOSCOPY (EGD) WITH PROPOFOL;  Surgeon: Wyline Mood, MD;  Location: [facility];  Service: Endoscopy;  Laterality: N/A;  . ESOPHAGOGASTRODUODENOSCOPY (EGD) WITH PROPOFOL N/A 01/02/2016   Procedure: ESOPHAGOGASTRODUODENOSCOPY (EGD) WITH PROPOFOL;  Surgeon: Midge Minium, MD;  Location: [facility];  Service: Endoscopy;   Laterality: N/A;  . EXCISION MORTON'S NEUROMA Right   . FINGER ARTHROPLASTY Left 08/21/2015   Procedure: LEFT THUMB  ARTHROPLASTY (CMC);  Surgeon: Deeann Saint, MD;  Location: [facility];  Service: Orthopedics;  Laterality: Left;  . TUBAL LIGATION    . TUMMY TUCK  2014    Family History  Problem Relation Age of Onset  . Breast cancer Maternal Aunt   . Stroke Mother   . Cancer Brother        LUNG    Social History Social History  Substance Use Topics  . Smoking status: Former Smoker    Types: Cigarettes  . Smokeless tobacco: Never Used  . Alcohol use Yes     Comment: RED WINE DAILY    Allergies  Allergen Reactions  . Latex Anaphylaxis  . Crestor [Rosuvastatin] Itching    ONLY WITH THE GENERIC  . Ergotamine-Caffeine Rash    Body rash  . Other Hives and Itching     AVOCADO ALL DAIRY PRODUCTS AND AVOCADO  . Pineapple Other (See Comments)    MOUTH SORES  . Soy Allergy Hives  . Ultram [Tramadol] Itching    PT CAN TAKE TRAMADOL BUT CANNOT TAKE GENERIC     Current Outpatient Prescriptions  Medication Sig Dispense Refill  . 5-HYDROXYTRYPTOPHAN PO Take 1 tablet by mouth daily.    Marland Kitchen amoxicillin-clavulanate (AUGMENTIN) 875-125 MG tablet Take 1 tablet by mouth 2 (two) times daily. 28 tablet 0  . aspirin 81 MG tablet Take 1 tablet by mouth daily.    Marland Kitchen atorvastatin (LIPITOR) 20 MG tablet Take 20 mg by mouth daily.    Marland Kitchen  Beclomethasone Dipropionate 80 MCG/ACT AERS Place 1 spray into the nose daily.    . benzonatate (TESSALON) 100 MG capsule Take 100 mg by mouth daily.    Marland Kitchen BIOTIN PO Take 1 tablet by mouth daily.    Marland Kitchen BLACK COHOSH PO Take 1 tablet by mouth daily.    . cetirizine (ZYRTEC) 10 MG tablet Take 10 mg by mouth daily.    Marland Kitchen dexlansoprazole (DEXILANT) 60 MG capsule Take 60 mg by mouth daily.    Marland Kitchen docusate sodium (COLACE) 100 MG capsule Take 1 capsule (100 mg total) by mouth 2 (two) times daily. 60 capsule 0  . doxycycline (VIBRAMYCIN) 100 MG capsule Take 1 capsule (100 mg  total) by mouth 2 (two) times daily. 20 capsule 0  . gabapentin (NEURONTIN) 400 MG capsule Take 1 capsule (400 mg total) by mouth 3 (three) times daily. 60 capsule 3  . HYDROcodone-acetaminophen (NORCO) 5-325 MG tablet Take 1 tablet by mouth every 6 (six) hours as needed for severe pain. 7 tablet 0  . levothyroxine (SYNTHROID, LEVOTHROID) 50 MCG tablet   0  . meloxicam (MOBIC) 15 MG tablet Take 15 mg by mouth daily.    . metoprolol tartrate (LOPRESSOR) 25 MG tablet Take 25 mg by mouth daily.    . metroNIDAZOLE (FLAGYL) 500 MG tablet Take 1 tablet (500 mg total) by mouth 3 (three) times daily. 42 tablet 0  . Misc Natural Products (PUMPKIN SEED OIL) CAPS Take 1 capsule by mouth daily.    Marland Kitchen omeprazole (PRILOSEC) 20 MG capsule Take 20 mg by mouth every morning.    . Papaya TABS Take 1 tablet by mouth daily.    Marland Kitchen PARoxetine (PAXIL) 20 MG tablet Take 20 mg by mouth daily.    . predniSONE (DELTASONE) 10 MG tablet Take 10 mg by mouth daily.    . promethazine (PHENERGAN) 25 MG tablet Take 1 tablet by mouth every 4 (four) hours.    . rosuvastatin (CRESTOR) 10 MG tablet Take 10 mg by mouth daily.    . simvastatin (ZOCOR) 20 MG tablet Take 20 mg by mouth daily.    . traMADol (ULTRAM) 50 MG tablet   0  . UNKNOWN TO PATIENT PT IS STARTING A THYROID PILL BUT HAS NOT PICKED IT UP FROM HER PHARMACY YET (08-10-15)    . VENTOLIN HFA 108 (90 Base) MCG/ACT inhaler inhale 2 puffs by mouth PRN  0   No current facility-administered medications for this visit.      Review of Systems A multi-point review of systems was asked and was negative except for the findings documented in the history of present illness  Physical Exam Blood pressure (!) 151/88, pulse 67, temperature 97.8 F (36.6 C), temperature source Oral, weight 78.9 kg (174 lb). CONSTITUTIONAL: o acute distress. EYES: Pupils are equal, round, and reactive to light, Sclera are non-icteric. EARS, NOSE, MOUTH AND THROAT: The oropharynx is clear. The oral  mucosa is pink and moist. Hearing is intact to voice. LYMPH NODES:  Lymph nodes in the neck are normal. RESPIRATORY:  Lungs are clear. There is normal respiratory effort, with equal breath sounds bilaterally, and without pathologic use of accessory muscles. CARDIOVASCULAR: Heart is regular without murmurs, gallops, or rubs. GI: The abdomen is soft, minimally tender to deep palpation in left lower quadrant, and nondistended. There are no palpable masses. There is no hepatosplenomegaly. There are normal bowel sounds in all quadrants. GU: Rectal deferred.   MUSCULOSKELETAL: Normal muscle strength and tone. No cyanosis or  edema.   SKIN: Turgor is good and there are no pathologic skin lesions or ulcers. NEUROLOGIC: Motor and sensation is grossly normal. Cranial nerves are grossly intact. PSYCH:  Oriented to person, place and time. Affect is normal.  Data Reviewed Images and labs reviewed. Labs are within normal limits including a normal white blood cell count and normal LFTs. CT scan of the abdomen shows inflammation of the sigmoid colon consistent with sigmoid diverticulitis I have personally reviewed the patient's imaging, laboratory findings and medical records.    Assessment    Sigmoid diverticulitis and gastroesophageal reflux disease    Plan    61 year old female with sigmoid diverticulitis which she is currently on antibiotics for. Discussed the pathophysiology of diverticulitis in detail as well as the treatment options. Given that she's never had an abscess, admission, need for IV antibiotics or other sequelae of diverticulitis discussed that surgical intervention is not currently warranted in her care.However discussed should she have a third bout of diverticulitis in the calendar year then we would revisit conversation. However, discussed the first line of treatment his antibiotics and her diverticulitis teased to fully resolve. Also discussed the diagnosis of GERD as well as the surgical  options. Discussed that her diverticulitis takes precedence currently and will need to be fully resolved before we would discuss the workup of antireflux surgery. She voiced understanding and will follow-up in clinic in 3 weeks for repeat exam and to discuss further workup for antireflux surgery at that time. She understands that she can call clinic immediately should her diverticulitis return incentive reporting to the ER.     Time spent with the patient was 45 minutes, with more than 50% of the time spent in face-to-face education, counseling and care coordination.     Ricarda Frame, MD FACS General Surgeon 10/28/2016, 4:00 PM

## 2016-10-29 ENCOUNTER — Telehealth: Payer: Self-pay

## 2016-10-29 ENCOUNTER — Inpatient Hospital Stay: Payer: BLUE CROSS/BLUE SHIELD

## 2016-10-29 ENCOUNTER — Inpatient Hospital Stay
Admission: AD | Admit: 2016-10-29 | Discharge: 2016-10-31 | DRG: 392 | Disposition: A | Discharge status: Home / Self Care | Payer: BLUE CROSS/BLUE SHIELD | Source: Ambulatory Visit | Attending: General Surgery | Admitting: General Surgery

## 2016-10-29 ENCOUNTER — Encounter: Payer: Self-pay | Admitting: Radiology

## 2016-10-29 DIAGNOSIS — Z91018 Allergy to other foods: Secondary | ICD-10-CM | POA: Diagnosis not present

## 2016-10-29 DIAGNOSIS — Z79899 Other long term (current) drug therapy: Secondary | ICD-10-CM | POA: Diagnosis not present

## 2016-10-29 DIAGNOSIS — J45909 Unspecified asthma, uncomplicated: Secondary | ICD-10-CM | POA: Diagnosis present

## 2016-10-29 DIAGNOSIS — M1990 Unspecified osteoarthritis, unspecified site: Secondary | ICD-10-CM | POA: Diagnosis present

## 2016-10-29 DIAGNOSIS — I2510 Atherosclerotic heart disease of native coronary artery without angina pectoris: Secondary | ICD-10-CM | POA: Diagnosis present

## 2016-10-29 DIAGNOSIS — Z888 Allergy status to other drugs, medicaments and biological substances status: Secondary | ICD-10-CM

## 2016-10-29 DIAGNOSIS — I10 Essential (primary) hypertension: Secondary | ICD-10-CM | POA: Diagnosis present

## 2016-10-29 DIAGNOSIS — Z91040 Latex allergy status: Secondary | ICD-10-CM

## 2016-10-29 DIAGNOSIS — K5732 Diverticulitis of large intestine without perforation or abscess without bleeding: Secondary | ICD-10-CM | POA: Diagnosis present

## 2016-10-29 DIAGNOSIS — K5792 Diverticulitis of intestine, part unspecified, without perforation or abscess without bleeding: Secondary | ICD-10-CM | POA: Diagnosis present

## 2016-10-29 DIAGNOSIS — K219 Gastro-esophageal reflux disease without esophagitis: Secondary | ICD-10-CM | POA: Diagnosis present

## 2016-10-29 LAB — CBC WITH DIFFERENTIAL/PLATELET
BASOS PCT: 1 %
Basophils Absolute: 0.1 10*3/uL (ref 0–0.1)
EOS ABS: 0.2 10*3/uL (ref 0–0.7)
Eosinophils Relative: 3 %
HCT: 41.1 % (ref 35.0–47.0)
HEMOGLOBIN: 14 g/dL (ref 12.0–16.0)
LYMPHS ABS: 2 10*3/uL (ref 1.0–3.6)
Lymphocytes Relative: 22 %
MCH: 32.8 pg (ref 26.0–34.0)
MCHC: 34 g/dL (ref 32.0–36.0)
MCV: 96.4 fL (ref 80.0–100.0)
MONO ABS: 0.8 10*3/uL (ref 0.2–0.9)
MONOS PCT: 9 %
NEUTROS ABS: 5.9 10*3/uL (ref 1.4–6.5)
NEUTROS PCT: 65 %
Platelets: 481 10*3/uL — ABNORMAL HIGH (ref 150–440)
RBC: 4.26 MIL/uL (ref 3.80–5.20)
RDW: 12.5 % (ref 11.5–14.5)
WBC: 9 10*3/uL (ref 3.6–11.0)

## 2016-10-29 LAB — COMPREHENSIVE METABOLIC PANEL
ALBUMIN: 4.3 g/dL (ref 3.5–5.0)
ALK PHOS: 55 U/L (ref 38–126)
ALT: 43 U/L (ref 14–54)
ANION GAP: 9 (ref 5–15)
AST: 45 U/L — ABNORMAL HIGH (ref 15–41)
BILIRUBIN TOTAL: 0.5 mg/dL (ref 0.3–1.2)
BUN: 8 mg/dL (ref 6–20)
CALCIUM: 9.2 mg/dL (ref 8.9–10.3)
CO2: 27 mmol/L (ref 22–32)
Chloride: 100 mmol/L — ABNORMAL LOW (ref 101–111)
Creatinine, Ser: 0.6 mg/dL (ref 0.44–1.00)
GFR calc non Af Amer: >60 mL/min (ref >60)
GLUCOSE: 141 mg/dL — AB (ref 65–99)
POTASSIUM: 4.2 mmol/L (ref 3.5–5.1)
SODIUM: 136 mmol/L (ref 135–145)
TOTAL PROTEIN: 7.3 g/dL (ref 6.5–8.1)

## 2016-10-29 LAB — PHOSPHORUS: PHOSPHORUS: 2.8 mg/dL (ref 2.5–4.6)

## 2016-10-29 LAB — MAGNESIUM: MAGNESIUM: 2.1 mg/dL (ref 1.7–2.4)

## 2016-10-29 MED ORDER — PANTOPRAZOLE SODIUM 40 MG IV SOLR
40.0000 mg | Freq: Every day | INTRAVENOUS | Status: DC
  Administered 2016-10-29 – 2016-10-30 (×2): 40 mg via INTRAVENOUS
  Filled 2016-10-29 (×2): qty 40

## 2016-10-29 MED ORDER — HYDRALAZINE HCL 20 MG/ML IJ SOLN: 10.0000 mg | INTRAMUSCULAR | Status: DC | PRN

## 2016-10-29 MED ORDER — DIPHENHYDRAMINE HCL 50 MG/ML IJ SOLN: 25.0000 mg | Freq: Four times a day (QID) | INTRAMUSCULAR | Status: DC | PRN

## 2016-10-29 MED ORDER — MORPHINE SULFATE (PF) 4 MG/ML IV SOLN
4.0000 mg | INTRAVENOUS | Status: DC | PRN
  Administered 2016-10-29: 4 mg via INTRAVENOUS
  Filled 2016-10-29: qty 1

## 2016-10-29 MED ORDER — IOPAMIDOL (ISOVUE-300) INJECTION 61%
15.0000 mL | INTRAVENOUS | Status: AC
  Administered 2016-10-29 (×2): 15 mL via ORAL

## 2016-10-29 MED ORDER — DEXTROSE IN LACTATED RINGERS 5 % IV SOLN
INTRAVENOUS | Status: DC
  Administered 2016-10-29 – 2016-10-31 (×6): via INTRAVENOUS

## 2016-10-29 MED ORDER — IOPAMIDOL (ISOVUE-300) INJECTION 61%
100.0000 mL | Freq: Once | INTRAVENOUS | Status: AC | PRN
  Administered 2016-10-29: 100 mL via INTRAVENOUS

## 2016-10-29 MED ORDER — ONDANSETRON 4 MG PO TBDP
4.0000 mg | ORAL_TABLET | Freq: Four times a day (QID) | ORAL | Status: DC | PRN
Start: 2016-10-29 — End: 2016-10-31
  Administered 2016-10-29 (×2): 4 mg via ORAL
  Filled 2016-10-29 (×2): qty 1

## 2016-10-29 MED ORDER — ONDANSETRON HCL 4 MG/2ML IJ SOLN: 4.0000 mg | Freq: Four times a day (QID) | INTRAMUSCULAR | Status: DC | PRN

## 2016-10-29 MED ORDER — DIPHENHYDRAMINE HCL 25 MG PO CAPS
25.0000 mg | ORAL_CAPSULE | Freq: Four times a day (QID) | ORAL | Status: DC | PRN
  Administered 2016-10-30 – 2016-10-31 (×2): 25 mg via ORAL
  Filled 2016-10-29 (×2): qty 1

## 2016-10-29 MED ORDER — PIPERACILLIN-TAZOBACTAM 3.375 G IVPB
3.3750 g | Freq: Three times a day (TID) | INTRAVENOUS | Status: DC
  Administered 2016-10-29 – 2016-10-31 (×6): 3.375 g via INTRAVENOUS
  Filled 2016-10-29 (×6): qty 50

## 2016-10-29 NOTE — Addendum Note (Signed)
Addended by: Ricarda Frame T on: 10/29/2016 12:10 PM   Modules accepted: Orders, SmartSet

## 2016-10-29 NOTE — H&P (Addendum)
Patient IDHamda Kluttsntino, female   DOB: 18-Mar-1955, 61 y.o.   MRN: [id number]  History of Present Illness Jakylah Bassinger is a 61 y.o. female with a known history of diverticulitis treated in the outpatient setting with by mouth antibiotics. She reports that actually her pain worsening over the last 24 hours. She also develops nausea but no vomiting. No fevers no chills she states that her pain is intermittent moderate to severe in intensity and located on the left lower quadrant. Pain gets worse when she moves. CT scan personally reviewed from 10 days ago show evidence of diverticulitis without abscess. Her CBC shows normal white count start increasing the platelet count. Normal creatinine. She did have a primary a history of coronary artery disease and had a cardiac catheter that was normal. She is able to perform more than 6 Mets of activity without any shortness of breath or chest pain. On the previous abdominal surgeries C-sections x3 Case discussed in detail with Dr. Tonita Cong  Past Medical History Past Medical History:  Diagnosis Date  . Arthritis   . Asthma   . Diverticulosis of colon   . GERD (gastroesophageal reflux disease)   . Headache    H/O MIGRAINES  . Hypertension   . Hypothyroidism        Past Surgical History:  Procedure Laterality Date  . CESAREAN SECTION     X3  . COLONOSCOPY WITH PROPOFOL N/A 12/21/2015   Procedure: COLONOSCOPY WITH PROPOFOL;  Surgeon: Wyline Mood, MD;  Location: [facility];  Service: Endoscopy;  Laterality: N/A;  . DIAGNOSTIC LAPAROSCOPY    . DILATION AND CURETTAGE OF UTERUS    . ESOPHAGOGASTRODUODENOSCOPY (EGD) WITH PROPOFOL N/A 12/21/2015   Procedure: ESOPHAGOGASTRODUODENOSCOPY (EGD) WITH PROPOFOL;  Surgeon: Wyline Mood, MD;  Location: [facility];  Service: Endoscopy;  Laterality: N/A;  . ESOPHAGOGASTRODUODENOSCOPY (EGD) WITH PROPOFOL N/A 01/02/2016   Procedure: ESOPHAGOGASTRODUODENOSCOPY (EGD) WITH PROPOFOL;  Surgeon: Midge Minium, MD;   Location: [facility];  Service: Endoscopy;  Laterality: N/A;  . EXCISION MORTON'S NEUROMA Right   . FINGER ARTHROPLASTY Left 08/21/2015   Procedure: LEFT THUMB  ARTHROPLASTY (CMC);  Surgeon: Deeann Saint, MD;  Location: [facility];  Service: Orthopedics;  Laterality: Left;  . TUBAL LIGATION    . TUMMY TUCK  2014    Allergies  Allergen Reactions  . Latex Anaphylaxis  . Crestor [Rosuvastatin] Itching    ONLY WITH THE GENERIC  . Ergotamine-Caffeine Rash    Body rash  . Other Hives and Itching     AVOCADO ALL DAIRY PRODUCTS AND AVOCADO  . Pineapple Other (See Comments)    MOUTH SORES  . Soy Allergy Hives  . Ultram [Tramadol] Itching    PT CAN TAKE TRAMADOL BUT CANNOT TAKE GENERIC     Current Facility-Administered Medications  Medication Dose Route Frequency Provider Last Rate Last Dose  . dextrose 5 % in lactated ringers infusion   Intravenous Continuous Ricarda Frame, MD 125 mL/hr at 10/29/16 1343    . diphenhydrAMINE (BENADRYL) capsule 25 mg  25 mg Oral Q6H PRN Ricarda Frame, MD       Or  . diphenhydrAMINE (BENADRYL) injection 25 mg  25 mg Intravenous Q6H PRN Ricarda Frame, MD      . hydrALAZINE (APRESOLINE) injection 10 mg  10 mg Intravenous Q2H PRN Ricarda Frame, MD      . morphine 4 MG/ML injection 4 mg  4 mg Intravenous Q4H PRN Ricarda Frame, MD      . ondansetron (ZOFRAN-ODT) disintegrating tablet  4 mg  4 mg Oral Q6H PRN Ricarda Frame, MD   4 mg at 10/29/16 1444   Or  . ondansetron (ZOFRAN) injection 4 mg  4 mg Intravenous Q6H PRN Ricarda Frame, MD      . pantoprazole (PROTONIX) injection 40 mg  40 mg Intravenous QHS Ricarda Frame, MD      . piperacillin-tazobactam (ZOSYN) IVPB 3.375 g  3.375 g Intravenous Lennie Muckle, MD 12.5 mL/hr at 10/29/16 1422 3.375 g at 10/29/16 1422    Family History Family History  Problem Relation Age of Onset  . Breast cancer Maternal Aunt   . Stroke Mother   . Cancer Brother        LUNG     Social  History Social History  Substance Use Topics  . Smoking status: Former Smoker    Types: Cigarettes  . Smokeless tobacco: Never Used  . Alcohol use Yes     Comment: RED WINE DAILY       Review of Systems  All other systems reviewed and are negative.  Full ROS of systems performed and is otherwise negative there than what is stated in the HPI  Physical Exam Blood pressure (!) 168/83, pulse 68, temperature 98 F (36.7 C), temperature source Oral, resp. rate 18, height  (1.575 m), SpO2 99 %.  CONSTITUTIONAL: NAD EYES: Pupils equal, round, and reactive to light, Sclera non-icteric. EARS, NOSE, MOUTH AND THROAT: The oropharynx is clear. Oral mucosa is pink and moist. Hearing is intact to voice.  NECK: Trachea is midline, and there is no jugular venous distension. Thyroid is without palpable abnormalities. LYMPH NODES:  Lymph nodes in the neck are not enlarged. RESPIRATORY:  Lungs are clear, and breath sounds are equal bilaterally. Normal respiratory effort without pathologic use of accessory muscles. CARDIOVASCULAR: Heart is regular without murmurs, gallops, or rubs. GI: The abdomen is  Soft,Mild TTP LLQ< no peritonitis , no rebound. MUSCULOSKELETAL:  Normal muscle strength and tone in all four extremities.    SKIN: Skin turgor is normal. There are no pathologic skin lesions.  NEUROLOGIC:  Motor and sensation is grossly normal.  Cranial nerves are grossly intact. PSYCH:  Alert and oriented to person, place and time. Affect is normal.  Data Reviewed  I have personally reviewed the patient's imaging and medical records.    Assessment/Plan 61 year old female with recurrent diverticulitis failed outpatient management. We will admit for IV antibiotic, nothing by mouth IV fluids and repeat CT scan of the abdomen and pelvis. The pain and CT findings may need percutaneous drain if an abscess in fact is found. At this point no evidence of peritonitis and I will doubt that she will need  an emergency surgery at this time. We will continue to do serial abdominal exams. Discussed with the patient in detail that she will probably benefit from elective sigmoid colectomy in the future. All her questions were answered  Sterling Big, MD FACS  Armella Stogner F Theresia Pree 10/29/2016, 3:55 PM

## 2016-10-29 NOTE — Telephone Encounter (Signed)
Patient called in at this time stating she is having abdominal cramping / pain. She denies fever, however she feels flushed.  She stated she was asked to call office if she were to have another episode of Diverticulitis instead of going to the ER.   I spoke with Dr.Woodham and he requested the patient be direct admitted through the office.  Patient notified at this time and stated she is calling insurance company to see if we are in network.   Patient notified to go to [facility] for registration and then she will be escorted to room 223.Marland Kitchen  Patient stated she would call me back as soon as she spoke with insurance company.

## 2016-10-30 LAB — CBC
HCT: 37.4 % (ref 35.0–47.0)
HEMOGLOBIN: 12.9 g/dL (ref 12.0–16.0)
MCH: 33.4 pg (ref 26.0–34.0)
MCHC: 34.4 g/dL (ref 32.0–36.0)
MCV: 97 fL (ref 80.0–100.0)
Platelets: 416 10*3/uL (ref 150–440)
RBC: 3.86 MIL/uL (ref 3.80–5.20)
RDW: 12.6 % (ref 11.5–14.5)
WBC: 8.2 10*3/uL (ref 3.6–11.0)

## 2016-10-30 LAB — BASIC METABOLIC PANEL
Anion gap: 5 (ref 5–15)
BUN: <5 mg/dL — ABNORMAL LOW (ref 6–20)
CHLORIDE: 106 mmol/L (ref 101–111)
CO2: 28 mmol/L (ref 22–32)
CREATININE: 0.71 mg/dL (ref 0.44–1.00)
Calcium: 8.4 mg/dL — ABNORMAL LOW (ref 8.9–10.3)
GFR calc non Af Amer: >60 mL/min (ref >60)
Glucose, Bld: 123 mg/dL — ABNORMAL HIGH (ref 65–99)
Potassium: 3.6 mmol/L (ref 3.5–5.1)
Sodium: 139 mmol/L (ref 135–145)

## 2016-10-30 MED ORDER — KETOROLAC TROMETHAMINE 30 MG/ML IJ SOLN
30.0000 mg | Freq: Four times a day (QID) | INTRAMUSCULAR | Status: DC
  Administered 2016-10-30 – 2016-10-31 (×4): 30 mg via INTRAVENOUS
  Filled 2016-10-30 (×4): qty 1

## 2016-10-30 NOTE — Progress Notes (Signed)
CC: Diverticulitis Subjective: CT scan personally reviewed There is evidence of mild diverticulitis without abscess.No free air She feels better this am, some mild mod pain No emesis  Objective: Vital signs in last 24 hours: Temp:  [97.8 F (36.6 C)-98.6 F (37 C)] 97.8 F (36.6 C) (10/17 0427) Pulse Rate:  [60-78] 60 (10/17 0427) Resp:  [17-18] 17 (10/17 0427) BP: (130-172)/(59-92) 130/59 (10/17 0427) SpO2:  [96 %-99 %] 98 % (10/17 0427) Weight:  [80.4 kg (177 lb 4.8 oz)] 80.4 kg (177 lb 4.8 oz) (10/16 1729) Last BM Date: 10/29/16  Intake/Output from previous day: 10/16 0701 - 10/17 0700 In: 1948.1 [I.V.:1861.5; IV Piggyback:86.6] Out: 1350 [Urine:1350] Intake/Output this shift: Total I/O In: 457.8 [P.O.:160; I.V.:297.8] Out: 1000 [Urine:1000]  Physical exam: NAD, alert Abd: soft, mild tenderness LLQ, no peritonitis Ext: no edema and well perfused Lab Results: CBC   Recent Labs  10/29/16 1430 10/30/16 0307  WBC 9.0 8.2  HGB 14.0 12.9  HCT 41.1 37.4  PLT 481* 416   BMET  Recent Labs  10/29/16 1430 10/30/16 0307  NA 136 139  K 4.2 3.6  CL 100* 106  CO2 27 28  GLUCOSE 141* 123*  BUN 8 <5*  CREATININE 0.60 0.71  CALCIUM 9.2 8.4*   PT/INR No results for input(s): LABPROT, INR in the last 72 hours. ABG No results for input(s): PHART, HCO3 in the last 72 hours.  Invalid input(s): PCO2, PO2  Studies/Results: Ct Abdomen Pelvis W Contrast  Result Date: 10/29/2016 CLINICAL DATA:  Diverticulitis worsening pain left lower quadrant EXAM: CT ABDOMEN AND PELVIS WITH CONTRAST TECHNIQUE: Multidetector CT imaging of the abdomen and pelvis was performed using the standard protocol following bolus administration of intravenous contrast. CONTRAST:  ISOVUE-300 IOPAMIDOL (ISOVUE-300) INJECTION 61% COMPARISON:  10/18/2016, CT chest 09/01/2015 FINDINGS: Lower chest: Re- demonstrated 4 mm left lower lobe subpleural pulmonary nodule. Normal heart size. Hepatobiliary:  No focal hepatic abnormality. No calcified gallstones or biliary dilatation. Subcentimeter hypodensity near the gallbladder fossa again noted. Pancreas: Unremarkable. No pancreatic ductal dilatation or surrounding inflammatory changes. Spleen: Normal in size without focal abnormality. Adrenals/Urinary Tract: Adrenal glands are unremarkable. Kidneys are normal, without renal calculi, focal lesion, or hydronephrosis. Bladder is unremarkable. Stomach/Bowel: Stomach is nonenlarged. No dilated small bowel. Decreased wall thickening of the distal sigmoid colon with decreased inflammatory changes. No evidence for abscess or extraluminal gas collection. Normal appendix. Vascular/Lymphatic: Extensive atherosclerotic calcification of the aorta. No aneurysmal dilatation. Stenotic appearing distal aorta at the bifurcation which is heavily calcified. Heavily calcified common iliac vessels. Flow demonstrated within the lumen. No significantly enlarged abdominal or pelvic lymph nodes. Reproductive: Uterus and bilateral adnexa are unremarkable. Other: Negative for free air or significant free fluid. Musculoskeletal: No acute or significant osseous findings. IMPRESSION: 1. Decreased wall thickening and inflammatory changes around the distal sigmoid colon, consistent with resolving diverticulitis. There is no new wall thickening or new inflammation present. There is no evidence for abscess or extraluminal gas to suggest perforation. 2. Stable 4 mm left lower lobe pulmonary nodule, felt benign 3. Stable subcentimeter hypo density in the liver near the gallbladder fossa, too small to further characterize 4. Extensive atherosclerotic vascular calcification. Stenotic appearing distal aorta at the bifurcation with heavy calcification that extends into the common iliac vessels with contrast enhancement/flow present. Electronically Signed   By: Jasmine Pang M.D.   On: 10/29/2016 16:44    Anti-infectives: Anti-infectives    Start      Dose/Rate Route Frequency Ordered Stop  10/29/16 1400  piperacillin-tazobactam (ZOSYN) IVPB 3.375 g     3.375 g 12.5 mL/hr over 240 Minutes Intravenous Every 8 hours 10/29/16 1323        Assessment/Plan: Diverticulitis Advance diet slowly No surgical intervention DC 24-48hrs depending on clinical condition  Sterling Bigiego Pabon, MD, FACS  10/30/2016

## 2016-10-30 NOTE — Progress Notes (Signed)
Patient tolerating clear liquid diet well. Contacted Dr Everlene FarrierPabon about progressing diet. He ordered a full liquid diet.

## 2016-10-31 MED ORDER — CIPROFLOXACIN HCL 500 MG PO TABS: 500.0000 mg | ORAL_TABLET | Freq: Two times a day (BID) | ORAL | 0 refills | Status: AC

## 2016-10-31 MED ORDER — METRONIDAZOLE 500 MG PO TABS: 500.0000 mg | ORAL_TABLET | Freq: Three times a day (TID) | ORAL | 0 refills | Status: DC

## 2016-10-31 NOTE — Progress Notes (Signed)
Pt alert and oriented x4, no complaints of pain or discomfort.  Bed in low position, call bell within reach.  Bed alarms on and functioning.  Assessment done and charted.  Will continue to monitor and do hourly rounding throughout the shift 

## 2016-10-31 NOTE — Progress Notes (Signed)
Discharge: Pt d/c from room walking with her son, Family member with the pt. Discharge instructions given to the patient and family members.  No questions from pt,  Pt dressed in street clothes and left with discharge papers and prescriptions in hand. IV d/ced, and no complaints of pain or discomfort.

## 2016-11-01 NOTE — Discharge Summary (Signed)
Patient ID: Morene RankinsRobin Herdt MRN: [id number] DOB/AGE: [birth year] [age] y.o.  Admit date: 10/29/2016 Discharge date: 11/01/2016   Discharge Diagnoses:  Active Problems:   Diverticulitis   Hospital Course: [age]-year-old female admitted for diverticulitis nonresponsive to outpatient therapy. She was admitted to hospital place an IV antibiotics and nothing by mouth for 24 hours.Also repeat CTNG DIET< AFEBRILE AND VITAL SIGNS STABLE> Physical exam revealed an female in no acute distress. Awake and alert. Abdomen was soft minimal tenderness to eft lower quadrant. No peritonitis. Extremities: Well perfused no edema. Condition at the time of discharge is stable   Disposition: 01-Home or Self Care  Discharge Instructions    Call MD for:  difficulty breathing, headache or visual disturbances    Complete by:  As directed    Call MD for:  extreme fatigue    Complete by:  As directed    Call MD for:  hives    Complete by:  As directed    Call MD for:  persistant dizziness or light-headedness    Complete by:  As directed    Call MD for:  persistant nausea and vomiting    Complete by:  As directed    Call MD for:  redness, tenderness, or signs of infection (pain, swelling, redness, odor or green/yellow discharge around incision site)    Complete by:  As directed    Call MD for:  severe uncontrolled pain    Complete by:  As directed    Call MD for:  temperature >100.4    Complete by:  As directed    Diet - low sodium heart healthy    Complete by:  As directed    Increase activity slowly    Complete by:  As directed      Allergies as of 10/31/2016      Reactions   Latex Anaphylaxis   Crestor [rosuvastatin] Itching   ONLY WITH THE GENERIC  STATES NO ALLERGY   Ergotamine-caffeine Hives   Body rash   Other Hives, Itching    AVOCADO ALL DAIRY PRODUCTS AND AVOCADO   Pineapple Other (See Comments)   MOUTH SORES   Soy Allergy Hives   Ultram [tramadol] Itching   PT CAN TAKE TRAMADOL BUT  CANNOT TAKE GENERIC  STATES NO ALLERGY       Medication List    STOP taking these medications   amoxicillin-clavulanate 875-125 MG tablet Commonly known as:  AUGMENTIN     TAKE these medications   5-HYDROXYTRYPTOPHAN PO Take 1 tablet by mouth daily.   azelastine 0.1 % nasal spray Commonly known as:  ASTELIN Place 1 spray into both nostrils 2 (two) times daily. Use in each nostril as directed   BIOTIN PO Take 1 tablet by mouth daily.   BLACK COHOSH PO Take 1 tablet by mouth daily.   ciprofloxacin 500 MG tablet Commonly known as:  CIPRO Take 1 tablet (500 mg total) by mouth 2 (two) times daily.   docusate sodium 100 MG capsule Commonly known as:  COLACE Take 1 capsule (100 mg total) by mouth 2 (two) times daily.   doxycycline 100 MG capsule Commonly known as:  VIBRAMYCIN Take 1 capsule (100 mg total) by mouth 2 (two) times daily.   gabapentin 400 MG capsule Commonly known as:  NEURONTIN Take 1 capsule (400 mg total) by mouth 3 (three) times daily.   HYDROcodone-acetaminophen 5-325 MG tablet Commonly known as:  NORCO Take 1 tablet by mouth every 6 (six) hours as needed for severe pain.  levocetirizine 5 MG tablet Commonly known as:  XYZAL Take 5 mg by mouth every evening.   metoprolol tartrate 25 MG tablet Commonly known as:  LOPRESSOR Take 25 mg by mouth 2 (two) times daily.   metroNIDAZOLE 500 MG tablet Commonly known as:  FLAGYL Take 1 tablet (500 mg total) by mouth 3 (three) times daily. What changed:  Another medication with the same name was added. Make sure you understand how and when to take each.   metroNIDAZOLE 500 MG tablet Commonly known as:  FLAGYL Take 1 tablet (500 mg total) by mouth 3 (three) times daily. What changed:  You were already taking a medication with the same name, and this prescription was added. Make sure you understand how and when to take each.   Pumpkin Seed Oil Caps Take 1 capsule by mouth daily.   VENTOLIN HFA 108 (90  Base) MCG/ACT inhaler Generic drug:  albuterol inhale 2 puffs by mouth PRN      Follow-up Information    Leafy Ro, MD. Go on 11/07/2016.   Specialty:  General Surgery Why:  Thursday at 3:30pm at the hospital follow-up Contact information: [address] [place] [address] [phone]            Sterling Big, MD FACS

## 2016-11-07 ENCOUNTER — Ambulatory Visit (INDEPENDENT_AMBULATORY_CARE_PROVIDER_SITE_OTHER): Payer: BLUE CROSS/BLUE SHIELD | Admitting: Surgery

## 2016-11-07 ENCOUNTER — Encounter: Payer: Self-pay | Admitting: Surgery

## 2016-11-07 VITALS — BP 155/88 | HR 73 | Temp 98.3°F | Ht 62.0 in | Wt 175.0 lb

## 2016-11-07 DIAGNOSIS — K5732 Diverticulitis of large intestine without perforation or abscess without bleeding: Secondary | ICD-10-CM

## 2016-11-07 NOTE — Patient Instructions (Signed)

## 2016-11-07 NOTE — Progress Notes (Signed)
Outpatient Surgical Follow Up  11/07/2016  Evelyn Simon is an 61 y.o. female.   Chief Complaint  Patient presents with  . Follow-up    Diverticulitis    HPI: hX OF  recurrenct diverticulitis requiring hospitalization. No perforation or abscess. She is feeling better. No fevers and mild abd discomfort. Chart reviewed, she had a colonoscopy year ago but her bowel prep was poor.  Past Medical History:  Diagnosis Date  . Arthritis   . Asthma   . Diverticulosis of colon   . GERD (gastroesophageal reflux disease)   . Headache    H/O MIGRAINES  . Hypertension   . Hypothyroidism     Past Surgical History:  Procedure Laterality Date  . CESAREAN SECTION     X3  . COLONOSCOPY WITH PROPOFOL N/A 12/21/2015   Procedure: COLONOSCOPY WITH PROPOFOL;  Surgeon: Wyline Mood, MD;  Location: [facility];  Service: Endoscopy;  Laterality: N/A;  . DIAGNOSTIC LAPAROSCOPY    . DILATION AND CURETTAGE OF UTERUS    . ESOPHAGOGASTRODUODENOSCOPY (EGD) WITH PROPOFOL N/A 12/21/2015   Procedure: ESOPHAGOGASTRODUODENOSCOPY (EGD) WITH PROPOFOL;  Surgeon: Wyline Mood, MD;  Location: [facility] ENDOSCOPY;  Service: Endoscopy;  Laterality: N/A;  . ESOPHAGOGASTRODUODENOSCOPY (EGD) WITH PROPOFOL N/A 01/02/2016   Procedure: ESOPHAGOGASTRODUODENOSCOPY (EGD) WITH PROPOFOL;  Surgeon: Midge Minium, MD;  Location: [facility] ENDOSCOPY;  Service: Endoscopy;  Laterality: N/A;  . EXCISION MORTON'S NEUROMA Right   . FINGER ARTHROPLASTY Left 08/21/2015   Procedure: LEFT THUMB  ARTHROPLASTY (CMC);  Surgeon: Deeann Saint, MD;  Location: [facility] ORS;  Service: Orthopedics;  Laterality: Left;  . TUBAL LIGATION    . TUMMY TUCK  2014    Family History  Problem Relation Age of Onset  . Breast cancer Maternal Aunt   . Stroke Mother   . Cancer Brother        LUNG    Social History:  reports that she has quit smoking. Her smoking use included Cigarettes. She has never used smokeless tobacco. She reports that she drinks alcohol. She reports  that she does not use drugs.  Allergies:  Allergies  Allergen Reactions  . Latex Anaphylaxis  . Crestor [Rosuvastatin] Itching    ONLY WITH THE GENERIC  STATES NO ALLERGY  . Ergotamine-Caffeine Hives    Body rash  . Other Hives and Itching     AVOCADO ALL DAIRY PRODUCTS AND AVOCADO  . Pineapple Other (See Comments)    MOUTH SORES  . Soy Allergy Hives  . Ultram [Tramadol] Itching    PT CAN TAKE TRAMADOL BUT CANNOT TAKE GENERIC  STATES NO ALLERGY     Medications reviewed.    ROS Full ROS performed and is otherwise negative other than what is stated in HPI   BP (!) 155/88   Pulse 73   Temp 98.3 F (36.8 C) (Oral)   Ht 5\' 2"  (1.575 m)   Wt 79.4 kg (175 lb)   BMI 32.01 kg/m   Physical Exam  Constitutional: She is oriented to person, place, and time and well-developed, well-nourished, and in no distress.  Pulmonary/Chest: Effort normal. No respiratory distress.  Abdominal: Soft. She exhibits no distension and no mass. There is no rebound and no guarding.  Minimal diffuse TTP LLQ, no peritonitis. Certainly much improved than a week ago.  Neurological: She is alert and oriented to person, place, and time. Gait normal. GCS score is 15.  Skin: Skin is warm and dry.  Psychiatric: Mood, memory, affect and judgment normal.  Nursing note and vitals  reviewed.   Assessment/Plan:  1. Diverticulitis of colon  Recurrent Diverticulitis. Last episode is resolved.She is interested in elective sigmoid colectomy. She will need colonoscopy before that. D/W the pt in detail about situation. We will arrange repeat colon by Dr. Tobi BastosAnna and f/u in 1 month or so.   Sterling Bigiego Samauri Kellenberger, MD [facility] Hospital[facility] General Surgeon

## 2016-11-12 ENCOUNTER — Encounter: Payer: Self-pay | Admitting: Gastroenterology

## 2016-11-12 ENCOUNTER — Ambulatory Visit (INDEPENDENT_AMBULATORY_CARE_PROVIDER_SITE_OTHER): Payer: BLUE CROSS/BLUE SHIELD | Admitting: Gastroenterology

## 2016-11-12 VITALS — BP 127/81 | HR 103 | Temp 97.9°F | Ht 62.0 in | Wt 172.8 lb

## 2016-11-12 DIAGNOSIS — K5732 Diverticulitis of large intestine without perforation or abscess without bleeding: Secondary | ICD-10-CM | POA: Diagnosis not present

## 2016-11-12 NOTE — Progress Notes (Signed)
Wyline Mood MD, MRCP(U.K) [address]  [address]  [place], [place] [address]  Main: [phone]  Fax: [phone]   Primary Care Physician: Lauro Regulus, MD  Primary Gastroenterologist:  Dr. Wyline Mood   No chief complaint on file.   HPI: Evelyn Simon is a 61 y.o. female who  has been referred for diverticulitis. Recently she had a episode of sigmoid diverticulitis. Treated with antibiotics. Discharged from the hospital on 10/31/16 She has been referred by Dr Everlene Farrier for a colonoscopy. I had attempted a colonoscopy in 12/2015 but the bowel prep was poor, had advised her to return in 6 weeks but she didn't follow up . EGD 12/2015 was normal.Has had 2 episodes of diverticulitis so far. Feeling better now. No abdominal pain . No diarrhea. No fevers. Consumes 2 glasses of wine a day . Does not smoke. No family history of colon cancer. Last complete colonoscopy in 2008 at [facility] , cant recall any polyps.     Current Outpatient Prescriptions  Medication Sig Dispense Refill  . 5-HYDROXYTRYPTOPHAN PO Take 1 tablet by mouth daily.    Marland Kitchen azelastine (ASTELIN) 0.1 % nasal spray Place 1 spray into both nostrils 2 (two) times daily. Use in each nostril as directed    . BIOTIN PO Take 1 tablet by mouth daily.    Marland Kitchen BLACK COHOSH PO Take 1 tablet by mouth daily.    Marland Kitchen gabapentin (NEURONTIN) 400 MG capsule Take 1 capsule (400 mg total) by mouth 3 (three) times daily. 60 capsule 3  . levocetirizine (XYZAL) 5 MG tablet Take 5 mg by mouth every evening.    . metoprolol tartrate (LOPRESSOR) 25 MG tablet Take 25 mg by mouth 2 (two) times daily.     . Misc Natural Products (PUMPKIN SEED OIL) CAPS Take 1 capsule by mouth daily.    . VENTOLIN HFA 108 (90 Base) MCG/ACT inhaler inhale 2 puffs by mouth PRN  0   No current facility-administered medications for this visit.     Allergies as of 11/12/2016 - Review Complete 11/07/2016  Allergen Reaction Noted  . Latex Anaphylaxis 08/07/2015  .  Crestor [rosuvastatin] Itching 08/10/2015  . Ergotamine-caffeine Hives 11/03/2013  . Other Hives and Itching 08/10/2015  . Pineapple Other (See Comments) 08/10/2015  . Soy allergy Hives 08/10/2015  . Ultram [tramadol] Itching 08/10/2015    ROS:  General: Negative for anorexia, weight loss, fever, chills, fatigue, weakness. ENT: Negative for hoarseness, difficulty swallowing , nasal congestion. CV: Negative for chest pain, angina, palpitations, dyspnea on exertion, peripheral edema.  Respiratory: Negative for dyspnea at rest, dyspnea on exertion, cough, sputum, wheezing.  GI: See history of present illness. GU:  Negative for dysuria, hematuria, urinary incontinence, urinary frequency, nocturnal urination.  Endo: Negative for unusual weight change.    Physical Examination:   There were no vitals taken for this visit.  General: Well-nourished, well-developed in no acute distress.  Eyes: No icterus. Conjunctivae pink. Mouth: Oropharyngeal mucosa moist and pink , no lesions erythema or exudate. Lungs: Clear to auscultation bilaterally. Non-labored. Heart: Regular rate and rhythm, no murmurs rubs or gallops.  Abdomen: Bowel sounds are normal, nontender, nondistended, no hepatosplenomegaly or masses, no abdominal bruits or hernia , no rebound or guarding.   Extremities: No lower extremity edema. No clubbing or deformities. Neuro: Alert and oriented x 3.  Grossly intact. Skin: Warm and dry, no jaundice.   Psych: Alert and cooperative, normal mood and affect.   Imaging Studies: Ct Abdomen Pelvis W  Contrast  Result Date: 10/29/2016 CLINICAL DATA:  Diverticulitis worsening pain left lower quadrant EXAM: CT ABDOMEN AND PELVIS WITH CONTRAST TECHNIQUE: Multidetector CT imaging of the abdomen and pelvis was performed using the standard protocol following bolus administration of intravenous contrast. CONTRAST:  ISOVUE-300 IOPAMIDOL (ISOVUE-300) INJECTION 61% COMPARISON:  10/18/2016, CT  chest 09/01/2015 FINDINGS: Lower chest: Re- demonstrated 4 mm left lower lobe subpleural pulmonary nodule. Normal heart size. Hepatobiliary: No focal hepatic abnormality. No calcified gallstones or biliary dilatation. Subcentimeter hypodensity near the gallbladder fossa again noted. Pancreas: Unremarkable. No pancreatic ductal dilatation or surrounding inflammatory changes. Spleen: Normal in size without focal abnormality. Adrenals/Urinary Tract: Adrenal glands are unremarkable. Kidneys are normal, without renal calculi, focal lesion, or hydronephrosis. Bladder is unremarkable. Stomach/Bowel: Stomach is nonenlarged. No dilated small bowel. Decreased wall thickening of the distal sigmoid colon with decreased inflammatory changes. No evidence for abscess or extraluminal gas collection. Normal appendix. Vascular/Lymphatic: Extensive atherosclerotic calcification of the aorta. No aneurysmal dilatation. Stenotic appearing distal aorta at the bifurcation which is heavily calcified. Heavily calcified common iliac vessels. Flow demonstrated within the lumen. No significantly enlarged abdominal or pelvic lymph nodes. Reproductive: Uterus and bilateral adnexa are unremarkable. Other: Negative for free air or significant free fluid. Musculoskeletal: No acute or significant osseous findings. IMPRESSION: 1. Decreased wall thickening and inflammatory changes around the distal sigmoid colon, consistent with resolving diverticulitis. There is no new wall thickening or new inflammation present. There is no evidence for abscess or extraluminal gas to suggest perforation. 2. Stable 4 mm left lower lobe pulmonary nodule, felt benign 3. Stable subcentimeter hypo density in the liver near the gallbladder fossa, too small to further characterize 4. Extensive atherosclerotic vascular calcification. Stenotic appearing distal aorta at the bifurcation with heavy calcification that extends into the common iliac vessels with contrast  enhancement/flow present. Electronically Signed   By: Jasmine Pang M.D.   On: 10/29/2016 16:44   Ct Abdomen Pelvis W Contrast  Result Date: 10/18/2016 CLINICAL DATA:  Bilateral lower abdominal pain EXAM: CT ABDOMEN AND PELVIS WITH CONTRAST TECHNIQUE: Multidetector CT imaging of the abdomen and pelvis was performed using the standard protocol following bolus administration of intravenous contrast. CONTRAST:  75 mL Isovue 370 intravenous COMPARISON:  Ultrasound 01/30/2013 CT chest 09/01/2015 FINDINGS: Lower chest: Decreased size of a left lower lobe subpleural nodule measuring 4 mm compared to 7 mm previously. No acute consolidation or pleural effusion. Normal heart size. Mitral calcification. Hepatobiliary: Mild focal fat infiltration near the falciform ligament. No calcified gallstones. No biliary dilatation. Subcentimeter hypodensity near the gallbladder fossa too small to further characterize Pancreas: Unremarkable. No pancreatic ductal dilatation or surrounding inflammatory changes. Spleen: Normal in size without focal abnormality. Adrenals/Urinary Tract: Adrenal glands are unremarkable. Kidneys are normal, without renal calculi, focal lesion, or hydronephrosis. Bladder is unremarkable. Stomach/Bowel: Stomach is nonenlarged. No dilated small bowel. Normal appendix. Focal wall thickening and surrounding mild to moderate inflammation in the distal sigmoid colon with diverticula present. No extraluminal gas. Vascular/Lymphatic: Aortic atherosclerosis. No enlarged abdominal or pelvic lymph nodes. Reproductive: Uterus and bilateral adnexa are unremarkable. Other: Negative for free air or free fluid. Musculoskeletal: No acute or significant osseous findings. IMPRESSION: 1. Focal wall thickening and associated mild to moderate inflammation involving the distal sigmoid colon, felt most consistent with acute diverticulitis. No extraluminal gas to suggest perforation. No abscess. 2. Interval decrease in size of a left  lower lobe subpleural pulmonary nodule measuring 4 mm compared to 7 mm previously, likely benign Electronically Signed  By: Jasmine PangKim  Fujinaga M.D.   On: 10/18/2016 15:40    Assessment and Plan:   Evelyn Simon is a 61 y.o. y/o female here to see me for a colonoscopy as she has had 2 episodes of diverticulitis recently, presently no abdominal issues.   Plan  1. Diagnostic colonoscopy in 4 weeks  2. Stop alcohol consumption. In excess presently  I have discussed alternative options, risks & benefits,  which include, but are not limited to, bleeding, infection, perforation,respiratory complication & drug reaction.  The patient agrees with this plan & written consent will be obtained.     Dr Wyline MoodKiran Kiarrah Rausch  MD,MRCP [facility](U.K) Follow up as needed

## 2016-11-12 NOTE — Addendum Note (Signed)
Addended by: Ardyth ManARTER, Tory Mckissack Z on: 11/12/2016 02:48 PM   Modules accepted: Orders, SmartSet

## 2016-11-15 DIAGNOSIS — N644 Mastodynia: Secondary | ICD-10-CM | POA: Insufficient documentation

## 2016-11-15 DIAGNOSIS — R635 Abnormal weight gain: Secondary | ICD-10-CM | POA: Insufficient documentation

## 2016-11-15 NOTE — Patient Instructions (Signed)
 Please call with any questions or concerns prior to your next scheduled appointment. Contact Information  Triage Nurse: [phone]  Monday-Friday 8:00am-6pm  * Prescription Refill Requests * Medication Authorizations * Test Results * Symptoms or Side Effects  Please be prepared to provide your name, date of birth, medical record number, and provider's name, along with a brief description of your question or non-urgent problem.     Appointment Hub: [phone] Monday-Friday 7:30am-4:30pm If you have need to make changes to your appointments or have questions or concerns regarding your appointments, please contact the appointment hub.  Clinical Notes on My Chart:  Progress notes documented by your healthcare team will now be available on the my chart portal.  We believe that patients should be a part of the healthcare team.  We encourage you to review notes after visits and in preparation for upcoming appointments.  This provides the opportunity to review recommendations as well as to prepare questions for you healthcare team to address during your next visit.  If you identify discrepancies in the documentation or have specific questions related to the notes, please bring them to your next scheduled visit to discuss with your physician, nurse practitioner, or physician assistant.  With increased transparency, our hope is that we create more trust, better communication, more shared decision-making, and increased satisfaction.  Please be aware that these notes will not be discussed over the phone or through My Chart messages.  They will be discussed only at your next office visit with your provider.

## 2016-11-15 NOTE — Progress Notes (Signed)
 Chief Complaint  Patient presents with  . Breast swelling/enlargement    NEW pt benign breast clinic Lab[place],NP    Mastalgia  (primary encounter diagnosis)  Weight gain   Breast Mass   Evelyn Simon 61 y.o.  presents for evaluation of a left breast enlargement and pain. She notes in the past 6 weeks L breast enlargement with no inciting cause other than possible weight gain of 10 lbs over the past 6 months due to not exercise.   She notes sticking, pins/needles  type pain in the LEFT lateral breast with is intermittent and can last from a few minutes or up to 15 minutes. Aggravating factors: none. Alleviating: removing her bra. She did have a bra refit 6 months ago and is waring a 40 DD. She has a history of diverticulosis with an acute episode of diverticulitis treated at [facility] hospitalization x 48 hrs with IV antibiotics and po antibiotics which she completed Cipro . She remains fatigued from this illness.   Change was noted6  months ago.  Patient has noted a change on breast exam. Patient does routinely do self breast exams. Patient admits to hormonal therapy in the past x 1 yr. Patient admits to birth control pills x 1 yr. Patient denies nipple discharge. Patient denies to previous breast biopsy. Patient denies a personal history of breast cancer.  GYNECOLOGICAL HISTORY: Age of menarche was 4.  Last menstrual period was approximately 2003. Age of menopause was 46 yrs. Patient denies fertility treatments.    Patient is G4 P3.  Age of first live birth was 29.  Patient did breast feed for a total of 10 months.   FAMILY HISTORY:  Family History  Problem Relation Age of Onset  . Stroke Mother   . Lung cancer Brother   . Breast cancer Maternal Aunt 53  . Heart disease Maternal Aunt        cause of death  . Brain cancer Son 58  . Hepatitis C Sister   . COPD Brother     PAST MEDICAL HISTORY Past Medical History:  Diagnosis Date  . Acquired hypothyroidism, unspecified 06/20/2016  .  Coronary artery disease involving native coronary artery of native heart without angina pectoris 05/21/2016   Per cardiology  . Essential hypertension, benign 04/19/2016  . Hypertension     PAST SURGICAL HISTORY: Past Surgical History:  Procedure Laterality Date  . CESAREAN SECTION      SOCIAL HISTORY: Social History   Tobacco Use  . Smoking status: Former Smoker    Last attempt to quit: 2003. 25 pack year history  . Smokeless tobacco: Never Used  Substance Use Topics  . Alcohol  use: Yes  . Drug use: Not on file College completed. 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   Exercise: in past gym/bike; yoga in the pool.     MEDICATIONS: Current Outpatient Medications Ordered in Epic  Medication Sig Dispense Refill  . 5-hydroxytryptophan, 5-HTP, (5-HTP ORAL) Take 1 tablet by mouth nightly      . albuterol  90 mcg/actuation inhaler Inhale 2 inhalations into the lungs every 6 (six) hours as needed for Wheezing. 1 Inhaler 12  . azelastine (ASTELIN) 137 mcg nasal spray Place 1 spray into both nostrils 2 (two) times daily. 30 mL 11  . BIOTIN ORAL Take 1 capsule by mouth once daily.    SABRA BLACK COHOSH ORAL Take 1 tablet by mouth once daily      . cyclobenzaprine (FLEXERIL) 10 MG tablet cyclobenzaprine 10 mg tablet    .  docusate (COLACE) 100 MG capsule as needed  0  . gabapentin  (NEURONTIN ) 400 MG capsule Take 1 capsule (400 mg total) by mouth 3 (three) times daily 270 capsule 0  . inhalational spacer (AEROCHAMBER) spacer Use as instructed. 1 each 2  . levocetirizine (XYZAL) 5 MG tablet Take 1 tablet (5 mg total) by mouth every evening. 30 tablet 11  . metoprolol  tartrate (LOPRESSOR ) 25 MG tablet Take 1 tablet (25 mg total) by mouth 2 (two) times daily. 60 tablet 11  . MILK THISTLE ORAL Take 1 capsule by mouth once daily. For her liver    . omeprazole (PRILOSEC) 10 MG DR capsule Take 10 mg by mouth once daily    . promethazine  (PHENERGAN ) 25 MG tablet Take 1 tablet (25 mg  total) by mouth every 4 (four) hours as needed for Nausea. 20 tablet 0  . UNABLE TO FIND Take 1 capsule by mouth once daily. Med Name: Pumpkin Seed Oil    . UNABLE TO FIND Take 1 capsule by mouth 2 (two) times daily. Med Name: Acid Reducer 360; takes for acid reflux    . UNABLE TO FIND AllerShield XTS     No current Epic-ordered facility-administered medications on file.      ALLERGIES: Allergies  Allergen Reactions  . Ergotamine-Caffeine Hives and Rash    Body rash   Body rash     . Latex Anaphylaxis  . Soy Hives  . Other Hives and Itching     AVACADOS    . Pineapple Other (See Comments)    MOUTH SORES    REVIEW OF SYSTEMS: 14 point review of systems was obtained and was negative other than for the following pertinent positive findings hot flashes, fatigue, weight change, skin rash, skin texture change, cough, wheezing, SOB, gi pain, heartburn, constipation, N/V/D, musculoskeletal pain, tremor, anxiety, painful urination, decrease in stream of urine, foul smelling urine, heat intolerance:   Objective:   Vitals   Vitals:   11/15/16 1124  BP: 137/80  Pulse: 72  Resp: 18  Temp: 36.4 C (97.5 F)    General appearance:  alert, appears stated age and cooperative  HEENT [place]/AT, PERRL, EOMs intact, oropharynx clear.  Neck Supple without adenopathy.  Lungs:  clear to auscultation bilaterally  Heart:  regular rate and rhythm, S1, S2 normal, no murmur, click, rub or gallop  Abdomen: soft, non-tender; bowel sounds normal; no masses,  no organomegaly  Extremities No cyanosis, clubbing or edema.  Psych Appropriate.  Neurological AAO x 3.   Lymph Nodes:  Cervical, supraclavicular, and axillary nodes normal.  Right Breast:  normal without suspicious masses, skin or nipple changes or axillary nodes and self-exam is taught and encouraged  Left Breast:  normal without suspicious masses, skin or nipple changes or axillary nodes and self-exam is taught and encouraged. L breast larger  than R by approximately 25%.     Imaging    #[id number] - MAMMO DIAGNOSTIC DIGITAL LEFT UNILATERAL LEFT DIGITAL DIAGNOSTIC MAMMOGRAM 3D/2D: 09/02/2016 CLINICAL: N62 Hypertrophy Of Breast-T50.905a Adverse Effect Of Unspecified  Drugs, Medicaments And Biological Substances, Initial Encounter.    Comparison is made to exam dated:  07/03/2016 and 03/06/2012 mammogram.   There are scattered fibroglandular elements in the left breast.    Patient states that she has felt general left breast swelling for the past  2 months.  In addition, patient states that she has noted two regions of  redness in the skin of the left breast (at the 12 o'clock position  and 7  o'clock position) that come and go. Today she sees no redness of the skin  in the left breast.  Tomosynthesis images were obtained as part of this exam.   No suspicious masses, calcifications, or other findings are seen in the  breast.     OVERALL STUDY BIRADS: 1 NEGATIVE No mammographic correlate to explain left breast swelling and intermittent  redness of the skin.  Recommend ultrasound for further evaluation of the  reported intermittent redness or erythema of the skin of the left breast.  There is no mammographic evidence of malignancy in the left breast.  The  exam was electronically reviewed by a staff physician.   The patient has been or will be contacted.     #[id number] - MAMMO US  BREAST LIMITED LEFT ULTRASOUND OF LEFT BREAST: 09/02/2016 Comparison is made to exam dated:  07/03/2016 mammogram.   Real-time ultrasound of the left breast was performed on the area of  interest.    Patient states that she has felt general left breast swelling for the past  2 months.  In addition, patient states that she has noted two regions of  redness of the skin of the left breast (at the 12 o'clock position and 7  o'clock position) that come and go. Today there are no areas of skin  erythema in the left breast.  Ultrasound from the 10  o'clock position to the 7 o'clock position of the  left breast (predominantly lateral breast) in the regions of prior skin  redness was performed.  No discrete cystic or solid masses were seen.  IMPRESSION: NEGATIVE  No mammographic or sonographic abnormalities are seen to explain the  patient's reported left breast swelling and intermittent erythema of the  skin.  Recommend decision regarding any further evaluation/ management of  these symptoms be based on clinical grounds.  There is no mammographic or  sonographic evidence of malignancy in the left breast.   A 1 year screening mammogram is recommended.  The exam was electronically  reviewed by a staff physician.   The patient has been or will be contacted.      Electronically signed by: Levorn Ferrari M.D.  Dorian Dutton M.D. Electronically signed on: rw,ll/:09/02/2016 13:38:09     Assessment:   This is a 61 y.o. female with LEFT breast enlargement with no mass, skin change, nipple discharge or inversion on exam today. The breast asymetry is likely due to weight gain. Imaging studies are normal. We discussed and declined repeat L breast US  as clinical exam is benign. She declined return short interval recheck in 4 - 6 weeks. Pt will return for new breast changes such as mass, redness, swelling or nipple inversion. I noted after visit and contacted pt by cell phone and spoke with her at 1530 that her last bilateral mammogram was done on 12/07/2007. She declined to get R mammogram updated in the next week as she is going to [place] and will not return until 01/2017. Pt agrees to update in Jan. 2019 upon return with plan to have a bilateral mammogram on or after 09/02/17. There was not any abnormality on clinical exam of the R breast today.     Plan:   Recommend return as needed for new or concerning breast  changes.   Order placed and requested R [facility] tomo mammogram in Jan. 2019. Request sent to hub to schedule.     Future Appointments  Date Time  Provider Department Center  11/15/2016  1:30 PM [facility] US  [facility]  02/19/2017 10:00 AM Lenon Layman Tanda DOUGLAS, MD [facility]   I personally performed the service, non-incident to.  (WP)   Greater than 50 % of this  30      minute visit was spent in education, counseling, and planning of care for problems presented at today's visit.    Christobal Carry Lines, NP

## 2016-11-15 NOTE — Progress Notes (Signed)
 Noticed left breast larger about six months ago, visually noticeable when wearing bra.  Intermittent feeling of being poked with sharp object in left outer breast.  NCCN Distress Questionnaire  Questionnaire Response Comments  Patient declines NCCN Distress Questionnaire:        Distress Thermometer Score (0-10) 3   Comments:     Concern Only YES answers are indicated below.  All blank spaces indicate a NO answer. Comments  Practical Problems    Housing Yes    Insurance/Financial Yes    Transportation      Work/School Yes    Treatment decisions Yes    Family Problems    Dealing with children      Dealing with partner      Ability to have children      Family health issues      Emotional Problems    Depression      Fears      Nervousness      Sadness Yes    Worry Yes    Loss of interest in usual activities      Spiritual/Religious concerns      Physical Concerns    Appearance Yes   Bathing/Dressing     Breathing Yes   Changes in urination     Constipation     Diarrhea     Eating     Fatigue Yes   Feeling swollen Yes(stomach, bloated, gaining weight)   Fevers     Getting around     Indigestion Yes   Memory     Mouth sores     Nausea Yes   Nose dry     Pain Yes   Sexual     Skin dry Yes   Sleep Yes   Speech     Tingling Hands/Feet     Resources & Referrals    Resources provided     Resources declined Yes   Resources comments:     Referral placed     Referral declined Yes   Referral comments:

## 2016-11-20 ENCOUNTER — Ambulatory Visit: Payer: Self-pay | Admitting: General Surgery

## 2016-12-09 ENCOUNTER — Encounter: Admission: RE | Payer: Self-pay | Source: Ambulatory Visit

## 2016-12-09 ENCOUNTER — Ambulatory Visit
Admission: RE | Admit: 2016-12-09 | Payer: BLUE CROSS/BLUE SHIELD | Source: Ambulatory Visit | Admitting: Gastroenterology

## 2016-12-09 SURGERY — COLONOSCOPY WITH PROPOFOL
Anesthesia: General

## 2016-12-11 ENCOUNTER — Other Ambulatory Visit: Payer: Self-pay

## 2016-12-11 ENCOUNTER — Telehealth: Payer: Self-pay

## 2016-12-11 ENCOUNTER — Telehealth: Payer: Self-pay | Admitting: Gastroenterology

## 2016-12-11 DIAGNOSIS — K5732 Diverticulitis of large intestine without perforation or abscess without bleeding: Secondary | ICD-10-CM

## 2016-12-11 NOTE — Telephone Encounter (Signed)
Spoke to patient concerning prep.   Patient states that she is supposed to have a 2- day prep. (This information could not be found in the notes).  Advised emailing a 2-day prep. No Rx required. All purchases OTC.   Rescheduled for 12/3

## 2016-12-11 NOTE — Telephone Encounter (Signed)
Patient left a voice message that she had to cancel her colonoscopy due to no supprep was called in and no rx was enclosed with her directions. This needs to be rescheduled before the end of the year please.

## 2016-12-11 NOTE — Telephone Encounter (Signed)
Spoke to patient and emailed 2 day prep

## 2016-12-16 ENCOUNTER — Ambulatory Visit: Payer: BLUE CROSS/BLUE SHIELD | Admitting: Certified Registered Nurse Anesthetist

## 2016-12-16 ENCOUNTER — Encounter
Admission: RE | Disposition: A | Discharge status: Home / Self Care | Payer: Self-pay | Source: Ambulatory Visit | Attending: Gastroenterology

## 2016-12-16 ENCOUNTER — Ambulatory Visit
Admission: RE | Admit: 2016-12-16 | Discharge: 2016-12-16 | Disposition: A | Discharge status: Home / Self Care | Payer: BLUE CROSS/BLUE SHIELD | Source: Ambulatory Visit | Attending: Gastroenterology | Admitting: Gastroenterology

## 2016-12-16 ENCOUNTER — Encounter: Payer: Self-pay | Admitting: Certified Registered Nurse Anesthetist

## 2016-12-16 DIAGNOSIS — K219 Gastro-esophageal reflux disease without esophagitis: Secondary | ICD-10-CM | POA: Insufficient documentation

## 2016-12-16 DIAGNOSIS — K5732 Diverticulitis of large intestine without perforation or abscess without bleeding: Secondary | ICD-10-CM

## 2016-12-16 DIAGNOSIS — J45909 Unspecified asthma, uncomplicated: Secondary | ICD-10-CM | POA: Insufficient documentation

## 2016-12-16 DIAGNOSIS — E039 Hypothyroidism, unspecified: Secondary | ICD-10-CM | POA: Diagnosis not present

## 2016-12-16 DIAGNOSIS — I10 Essential (primary) hypertension: Secondary | ICD-10-CM | POA: Insufficient documentation

## 2016-12-16 DIAGNOSIS — Z87891 Personal history of nicotine dependence: Secondary | ICD-10-CM | POA: Insufficient documentation

## 2016-12-16 HISTORY — PX: COLONOSCOPY WITH PROPOFOL: SHX5780

## 2016-12-16 SURGERY — COLONOSCOPY WITH PROPOFOL
Anesthesia: General

## 2016-12-16 MED ORDER — PROPOFOL 500 MG/50ML IV EMUL
INTRAVENOUS | Status: DC | PRN
  Administered 2016-12-16: 160 ug/kg/min via INTRAVENOUS

## 2016-12-16 MED ORDER — PROPOFOL 500 MG/50ML IV EMUL
INTRAVENOUS | Status: AC
  Filled 2016-12-16: qty 50

## 2016-12-16 MED ORDER — LIDOCAINE HCL (PF) 2 % IJ SOLN
INTRAMUSCULAR | Status: AC
  Filled 2016-12-16: qty 10

## 2016-12-16 MED ORDER — PROPOFOL 10 MG/ML IV BOLUS
INTRAVENOUS | Status: DC | PRN
  Administered 2016-12-16: 40 mg via INTRAVENOUS
  Administered 2016-12-16: 10 mg via INTRAVENOUS
  Administered 2016-12-16: 40 mg via INTRAVENOUS

## 2016-12-16 MED ORDER — LIDOCAINE HCL (CARDIAC) 20 MG/ML IV SOLN
INTRAVENOUS | Status: DC | PRN
  Administered 2016-12-16: 50 mg via INTRAVENOUS

## 2016-12-16 MED ORDER — SODIUM CHLORIDE 0.9 % IV SOLN
INTRAVENOUS | Status: DC
  Administered 2016-12-16: 10:00:00 via INTRAVENOUS

## 2016-12-16 NOTE — Op Note (Signed)
[facility] Gastroenterology Patient Name: Evelyn Simon Procedure Date: 12/16/2016 10:12 AM MRN: [id number] Account #: [id number] Date of Birth: March 06, 1955 Admit Type: Outpatient Age: 61 Room: [facility] ENDO ROOM 4 Gender: Female Note Status: Finalized Procedure:            Colonoscopy Indications:          Follow-up of diverticulitis Providers:            Wyline Mood MD, MD Referring MD:         Marya Amsler. Dareen Piano MD, MD (Referring MD) Medicines:            Monitored Anesthesia Care Complications:        No immediate complications. Procedure:            Pre-Anesthesia Assessment:                       - Prior to the procedure, a History and Physical was                        performed, and patient medications, allergies and                        sensitivities were reviewed. The patient's tolerance of                        previous anesthesia was reviewed.                       - The risks and benefits of the procedure and the                        sedation options and risks were discussed with the                        patient. All questions were answered and informed                        consent was obtained.                       - ASA Grade Assessment: II - A patient with mild                        systemic disease.                       After obtaining informed consent, the colonoscope was                        passed under direct vision. Throughout the procedure,                        the patient's blood pressure, pulse, and oxygen                        saturations were monitored continuously. The                        Colonoscope was introduced through the anus and  advanced to the the cecum, identified by the                        appendiceal orifice, IC valve and transillumination.                        The colonoscopy was performed with moderate difficulty                        due to a tortuous colon. Successful  completion of the                        procedure was aided by applying abdominal pressure. The                        patient tolerated the procedure well. The quality of                        the bowel preparation was fair. Findings:      The perianal and digital rectal examinations were normal.      Multiple medium-mouthed diverticula were found in the entire colon.      The exam was otherwise without abnormality on direct and retroflexion       views. Impression:           - Preparation of the colon was fair.                       - Diverticulosis in the entire examined colon.                       - The examination was otherwise normal on direct and                        retroflexion views.                       - No specimens collected. Recommendation:       - Discharge patient to home (with escort).                       - Resume previous diet.                       - Continue present medications.                       - Prep was inadequate to rule out small lesions, no                        large polyps or masses seen . Procedure Code(s):    --- Professional ---                       [id number], Colonoscopy, flexible; diagnostic, including                        collection of specimen(s) by brushing or washing, when                        performed (separate procedure) Diagnosis Code(s):    --- Professional ---  K57.32, Diverticulitis of large intestine without                        perforation or abscess without bleeding                       K57.30, Diverticulosis of large intestine without                        perforation or abscess without bleeding CPT copyright 2016 American Medical Association. All rights reserved. The codes documented in this report are preliminary and upon coder review may  be revised to meet current compliance requirements. Wyline MoodKiran Davona Kinoshita, MD Wyline MoodKiran Gyanna Jarema MD, MD 12/16/2016 10:33:01 AM This report has been signed  electronically. Number of Addenda: 0 Note Initiated On: 12/16/2016 10:12 AM Scope Withdrawal Time: 0 hours 6 minutes 23 seconds  Total Procedure Duration: 0 hours 11 minutes 26 seconds       [facility]

## 2016-12-16 NOTE — Anesthesia Preprocedure Evaluation (Signed)
Anesthesia Evaluation  Patient identified by MRN, date of birth, ID band Patient awake    Reviewed: Allergy & Precautions, H&P , NPO status , Patient's Chart, lab work & pertinent test results, reviewed documented beta blocker date and time   Airway Mallampati: II   Neck ROM: full    Dental  (+) Teeth Intact   Pulmonary neg pulmonary ROS, asthma , former smoker,    Pulmonary exam normal        Cardiovascular Exercise Tolerance: Good hypertension, On Medications + CAD  negative cardio ROS Normal cardiovascular exam Rhythm:regular Rate:Normal     Neuro/Psych  Headaches, negative neurological ROS  negative psych ROS   GI/Hepatic negative GI ROS, Neg liver ROS, GERD  Medicated,  Endo/Other  negative endocrine ROSHypothyroidism   Renal/GU negative Renal ROS  negative genitourinary   Musculoskeletal   Abdominal   Peds  Hematology negative hematology ROS (+)   Anesthesia Other Findings Past Medical History: No date: Arthritis No date: Asthma No date: Diverticulosis of colon No date: GERD (gastroesophageal reflux disease) No date: Headache     Comment:  H/O MIGRAINES No date: Hypertension No date: Hypothyroidism Past Surgical History: No date: CESAREAN SECTION     Comment:  X3 12/21/2015: COLONOSCOPY WITH PROPOFOL; N/A     Comment:  Procedure: COLONOSCOPY WITH PROPOFOL;  Surgeon: Wyline MoodKiran               Anna, MD;  Location: [facility] ENDOSCOPY;  Service: Endoscopy;              Laterality: N/A; No date: DIAGNOSTIC LAPAROSCOPY No date: DILATION AND CURETTAGE OF UTERUS 12/21/2015: ESOPHAGOGASTRODUODENOSCOPY (EGD) WITH PROPOFOL; N/A     Comment:  Procedure: ESOPHAGOGASTRODUODENOSCOPY (EGD) WITH               PROPOFOL;  Surgeon: Wyline MoodKiran Anna, MD;  Location: [facility]               ENDOSCOPY;  Service: Endoscopy;  Laterality: N/A; 01/02/2016: ESOPHAGOGASTRODUODENOSCOPY (EGD) WITH PROPOFOL; N/A     Comment:  Procedure:  ESOPHAGOGASTRODUODENOSCOPY (EGD) WITH               PROPOFOL;  Surgeon: Midge Miniumarren Wohl, MD;  Location: [facility]               ENDOSCOPY;  Service: Endoscopy;  Laterality: N/A; No date: EXCISION MORTON'S NEUROMA; Right 08/21/2015: FINGER ARTHROPLASTY; Left     Comment:  Procedure: LEFT THUMB  ARTHROPLASTY (CMC);  Surgeon:               Deeann SaintHoward Miller, MD;  Location: [facility] ORS;  Service:               Orthopedics;  Laterality: Left; No date: morton neuromas removal right foot No date: TUBAL LIGATION 2014: TUMMY TUCK   Reproductive/Obstetrics negative OB ROS                             Anesthesia Physical Anesthesia Plan  ASA: III  Anesthesia Plan: General   Post-op Pain Management:    Induction:   PONV Risk Score and Plan:   Airway Management Planned:   Additional Equipment:   Intra-op Plan:   Post-operative Plan:   Informed Consent: I have reviewed the patients History and Physical, chart, labs and discussed the procedure including the risks, benefits and alternatives for the proposed anesthesia with the patient or authorized representative who has indicated his/her understanding and acceptance.  Dental Advisory Given  Plan Discussed with: CRNA  Anesthesia Plan Comments:         Anesthesia Quick Evaluation

## 2016-12-16 NOTE — Anesthesia Post-op Follow-up Note (Signed)
Anesthesia QCDR form completed.        

## 2016-12-16 NOTE — Transfer of Care (Signed)
Immediate Anesthesia Transfer of Care Note  Patient: Evelyn Simon  Procedure(s) Performed: COLONOSCOPY WITH PROPOFOL (N/A )  Patient Location: PACU  Anesthesia Type:General  Level of Consciousness: awake and alert   Airway & Oxygen Therapy: Patient Spontanous Breathing  Post-op Assessment: Report given to RN and Post -op Vital signs reviewed and stable  Post vital signs: Reviewed and stable  Last Vitals:  Vitals:   12/16/16 0943 12/16/16 1034  BP: (!) 142/84 114/64  Pulse: 71 72  Resp: 20 15  Temp: 36.6 C (!) 36.2 C  SpO2: 98% 98%    Last Pain:  Vitals:   12/16/16 1034  TempSrc: Tympanic         Complications: No apparent anesthesia complications

## 2016-12-16 NOTE — Anesthesia Procedure Notes (Signed)
Date/Time: 12/16/2016 10:15 AM Performed by: Ginger CarneMichelet, Itzel Mckibbin, CRNA Pre-anesthesia Checklist: Patient identified, Emergency Drugs available, Suction available, Patient being monitored and Timeout performed Patient Re-evaluated:Patient Re-evaluated prior to induction Oxygen Delivery Method: Nasal cannula

## 2016-12-16 NOTE — H&P (Signed)
Evelyn MoodKiran Aedan Geimer, MD [address], [address], [place], [place], [id number] [address], [address], [place], [place], [address] Phone: [phone]  Fax: [phone]  Primary Care Physician:  Lauro RegulusAnderson, Marshall W, MD   Pre-Procedure History & Physical: HPI:  Evelyn Simon is a 61 y.o. female is here for an colonoscopy.   Past Medical History:  Diagnosis Date  . Arthritis   . Asthma   . Diverticulosis of colon   . GERD (gastroesophageal reflux disease)   . Headache    H/O MIGRAINES  . Hypertension   . Hypothyroidism     Past Surgical History:  Procedure Laterality Date  . CESAREAN SECTION     X3  . COLONOSCOPY WITH PROPOFOL N/A 12/21/2015   Procedure: COLONOSCOPY WITH PROPOFOL;  Surgeon: Evelyn MoodKiran Adair Lemar, MD;  Location: [facility];  Service: Endoscopy;  Laterality: N/A;  . DIAGNOSTIC LAPAROSCOPY    . DILATION AND CURETTAGE OF UTERUS    . ESOPHAGOGASTRODUODENOSCOPY (EGD) WITH PROPOFOL N/A 12/21/2015   Procedure: ESOPHAGOGASTRODUODENOSCOPY (EGD) WITH PROPOFOL;  Surgeon: Evelyn MoodKiran Parker Wherley, MD;  Location: [facility];  Service: Endoscopy;  Laterality: N/A;  . ESOPHAGOGASTRODUODENOSCOPY (EGD) WITH PROPOFOL N/A 01/02/2016   Procedure: ESOPHAGOGASTRODUODENOSCOPY (EGD) WITH PROPOFOL;  Surgeon: Midge Miniumarren Wohl, MD;  Location: [facility];  Service: Endoscopy;  Laterality: N/A;  . EXCISION MORTON'S NEUROMA Right   . FINGER ARTHROPLASTY Left 08/21/2015   Procedure: LEFT THUMB  ARTHROPLASTY (CMC);  Surgeon: Deeann SaintHoward Miller, MD;  Location: [facility] OR[facility];  Service: Orthopedics;  Laterality: Left;  . morton neuromas removal right foot    . TUBAL LIGATION    . TUMMY TUCK  2014    Prior to Admission medications   Medication Sig Start Date End Date Taking? Authorizing Provider  gabapentin (NEURONTIN) 400 MG capsule Take 1 capsule (400 mg total) by mouth 3 (three) times daily. 08/21/15  Yes Deeann SaintMiller, Howard, MD  5-HYDROXYTRYPTOPHAN PO Take 1 tablet by mouth daily.    [provider]    amoxicillin-clavulanate (AUGMENTIN) 875-125 MG tablet Take 1 tablet by mouth 2 (two) times daily. 10/18/16   [provider]  azelastine (ASTELIN) 0.1 % nasal spray Place 1 spray into both nostrils 2 (two) times daily. Use in each nostril as directed    [provider]  BIOTIN PO Take 1 tablet by mouth daily.    [provider]  BLACK COHOSH PO Take 1 tablet by mouth daily.    [provider]  cefUROXime (CEFTIN) 500 MG tablet take 1 tablet by mouth twice a day for 7 days 10/21/16   [provider]  docusate sodium (COLACE) 100 MG capsule Take 100 mg by mouth 2 (two) times daily. 10/18/16   [provider]  HYDROcodone-acetaminophen (NORCO/VICODIN) 5-325 MG tablet take 1 tablet by mouth every 6 hours if needed for severe pain 10/18/16   [provider]  levocetirizine (XYZAL) 5 MG tablet Take 5 mg by mouth every evening.    [provider]  metoprolol tartrate (LOPRESSOR) 25 MG tablet Take 25 mg by mouth 2 (two) times daily.  04/19/16 04/19/17  [provider]  Misc Natural Products (PUMPKIN SEED OIL) CAPS Take 1 capsule by mouth daily.    [provider]  VENTOLIN HFA 108 (90 Base) MCG/ACT inhaler inhale 2 puffs by mouth PRN 07/26/15   [provider]    Allergies as of 12/11/2016 - Review Complete 11/12/2016  Allergen Reaction Noted  . Latex Anaphylaxis 08/07/2015  . Ergotamine-caffeine Hives 11/03/2013  .  Other Hives and Itching 08/10/2015  . Pineapple Other (See Comments) 08/10/2015  . Soy allergy Hives 08/10/2015  . Crestor [rosuvastatin] Itching 08/10/2015  . Ultram [tramadol] Itching 08/10/2015    Family History  Problem Relation Age of Onset  . Breast cancer Maternal Aunt   . Stroke Mother   . Heart disease Mother   . Cancer Brother        LUNG  . Heart disease Father     Social History   Socioeconomic History  . Marital status: Married    Spouse name: Not on file  . Number of  children: Not on file  . Years of education: Not on file  . Highest education level: Not on file  Social Needs  . Financial resource strain: Not on file  . Food insecurity - worry: Not on file  . Food insecurity - inability: Not on file  . Transportation needs - medical: Not on file  . Transportation needs - non-medical: Not on file  Occupational History  . Not on file  Tobacco Use  . Smoking status: Former Smoker    Types: Cigarettes  . Smokeless tobacco: Never Used  Substance and Sexual Activity  . Alcohol use: Yes    Alcohol/week: 1.2 oz    Types: 2 Glasses of wine per week    Comment: RED WINE DAILY,none 24hrs  . Drug use: No  . Sexual activity: No  Other Topics Concern  . Not on file  Social History Narrative  . Not on file    Review of Systems: See HPI, otherwise negative ROS  Physical Exam: BP (!) 142/84   Pulse 71   Temp 97.8 F (36.6 C) (Tympanic)   Resp 20   Ht 5\' 2"  (1.575 m)   Wt 170 lb (77.1 kg)   SpO2 98%   BMI 31.09 kg/m  General:   Alert,  pleasant and cooperative in NAD Head:  Normocephalic and atraumatic. Neck:  Supple; no masses or thyromegaly. Lungs:  Clear throughout to auscultation, normal respiratory effort.    Heart:  +S1, +S2, Regular rate and rhythm, No edema. Abdomen:  Soft, nontender and nondistended. Normal bowel sounds, without guarding, and without rebound.   Neurologic:  Alert and  oriented x4;  grossly normal neurologically.  Impression/Plan: Evelyn Simon is here for an colonoscopy to be performed for diverticulitis.   Risks, benefits, limitations, and alternatives regarding  colonoscopy have been reviewed with the patient.  Questions have been answered.  All parties agreeable.   Evelyn MoodKiran Ziquan Fidel, MD  12/16/2016, 10:10 AM

## 2016-12-17 ENCOUNTER — Encounter: Payer: Self-pay | Admitting: Gastroenterology

## 2016-12-23 NOTE — Anesthesia Postprocedure Evaluation (Signed)
Anesthesia Post Note  Patient: Evelyn Simon  Procedure(s) Performed: COLONOSCOPY WITH PROPOFOL (N/A )  Patient location during evaluation: PACU Anesthesia Type: General Level of consciousness: awake and alert Pain management: pain level controlled Vital Signs Assessment: post-procedure vital signs reviewed and stable Respiratory status: spontaneous breathing, nonlabored ventilation, respiratory function stable and patient connected to nasal cannula oxygen Cardiovascular status: blood pressure returned to baseline and stable Postop Assessment: no apparent nausea or vomiting Anesthetic complications: no     Last Vitals:  Vitals:   12/16/16 1054 12/16/16 1104  BP: 133/76 (!) 149/75  Pulse: 66 62  Resp: 12 10  Temp:    SpO2: 98% 99%    Last Pain:  Vitals:   12/17/16 0801  TempSrc:   PainSc: 0-No pain                 Yevette EdwardsJames G Vinson Tietze

## 2016-12-27 ENCOUNTER — Telehealth: Payer: Self-pay

## 2016-12-27 NOTE — Telephone Encounter (Signed)
Called patient to let her know that I was calling her to schedule a follow up appointment since she had her colonoscopy done on 12/16/2016. Patient stated that she was not able to come in on 12/27-28/2018 since her son had surgery scheduled on 01/09/2017. Therefore, I had to offer her an appointment until Dr. Everlene FarrierPabon was back in the clinic. Patient agreed on coming in on 01/30/2017. I told her that I would mail her the appointment remainder and she agreed.

## 2017-01-24 ENCOUNTER — Other Ambulatory Visit: Payer: Self-pay

## 2017-01-27 ENCOUNTER — Ambulatory Visit: Payer: Self-pay | Admitting: Surgery

## 2017-08-22 ENCOUNTER — Other Ambulatory Visit: Payer: Self-pay | Admitting: Physician Assistant

## 2017-08-22 ENCOUNTER — Ambulatory Visit
Admission: RE | Admit: 2017-08-22 | Discharge: 2017-08-22 | Disposition: A | Discharge status: Home / Self Care | Payer: BLUE CROSS/BLUE SHIELD | Source: Ambulatory Visit | Attending: Physician Assistant | Admitting: Physician Assistant

## 2017-08-22 DIAGNOSIS — R42 Dizziness and giddiness: Secondary | ICD-10-CM | POA: Insufficient documentation

## 2017-08-22 DIAGNOSIS — R531 Weakness: Secondary | ICD-10-CM

## 2017-09-09 DIAGNOSIS — I779 Disorder of arteries and arterioles, unspecified: Secondary | ICD-10-CM | POA: Insufficient documentation

## 2017-09-20 ENCOUNTER — Encounter: Payer: Self-pay | Admitting: Emergency Medicine

## 2017-09-20 ENCOUNTER — Emergency Department
Admission: EM | Admit: 2017-09-20 | Discharge: 2017-09-20 | Disposition: A | Discharge status: Home / Self Care | Payer: BLUE CROSS/BLUE SHIELD | Attending: Emergency Medicine | Admitting: Emergency Medicine

## 2017-09-20 ENCOUNTER — Emergency Department: Payer: BLUE CROSS/BLUE SHIELD

## 2017-09-20 ENCOUNTER — Other Ambulatory Visit: Payer: Self-pay

## 2017-09-20 DIAGNOSIS — R1032 Left lower quadrant pain: Secondary | ICD-10-CM | POA: Diagnosis present

## 2017-09-20 DIAGNOSIS — I10 Essential (primary) hypertension: Secondary | ICD-10-CM | POA: Insufficient documentation

## 2017-09-20 DIAGNOSIS — E039 Hypothyroidism, unspecified: Secondary | ICD-10-CM | POA: Insufficient documentation

## 2017-09-20 DIAGNOSIS — K5792 Diverticulitis of intestine, part unspecified, without perforation or abscess without bleeding: Secondary | ICD-10-CM | POA: Diagnosis not present

## 2017-09-20 DIAGNOSIS — Z79899 Other long term (current) drug therapy: Secondary | ICD-10-CM | POA: Insufficient documentation

## 2017-09-20 DIAGNOSIS — Z91040 Latex allergy status: Secondary | ICD-10-CM | POA: Insufficient documentation

## 2017-09-20 DIAGNOSIS — J45909 Unspecified asthma, uncomplicated: Secondary | ICD-10-CM | POA: Insufficient documentation

## 2017-09-20 LAB — CBC
HCT: 41.1 % (ref 35.0–47.0)
Hemoglobin: 14.3 g/dL (ref 12.0–16.0)
MCH: 33.7 pg (ref 26.0–34.0)
MCHC: 34.8 g/dL (ref 32.0–36.0)
MCV: 96.7 fL (ref 80.0–100.0)
PLATELETS: 420 10*3/uL (ref 150–440)
RBC: 4.25 MIL/uL (ref 3.80–5.20)
RDW: 13.2 % (ref 11.5–14.5)
WBC: 10 10*3/uL (ref 3.6–11.0)

## 2017-09-20 LAB — COMPREHENSIVE METABOLIC PANEL
ALT: 33 U/L (ref 0–44)
AST: 37 U/L (ref 15–41)
Albumin: 4.8 g/dL (ref 3.5–5.0)
Alkaline Phosphatase: 75 U/L (ref 38–126)
Anion gap: 8 (ref 5–15)
BUN: 18 mg/dL (ref 8–23)
CALCIUM: 9.1 mg/dL (ref 8.9–10.3)
CHLORIDE: 103 mmol/L (ref 98–111)
CO2: 28 mmol/L (ref 22–32)
CREATININE: 0.79 mg/dL (ref 0.44–1.00)
GFR calc non Af Amer: >60 mL/min (ref >60)
Glucose, Bld: 100 mg/dL — ABNORMAL HIGH (ref 70–99)
POTASSIUM: 4.2 mmol/L (ref 3.5–5.1)
SODIUM: 139 mmol/L (ref 135–145)
Total Bilirubin: 0.6 mg/dL (ref 0.3–1.2)
Total Protein: 7.8 g/dL (ref 6.5–8.1)

## 2017-09-20 LAB — URINALYSIS, COMPLETE (UACMP) WITH MICROSCOPIC
Bacteria, UA: NONE SEEN
Bilirubin Urine: NEGATIVE
GLUCOSE, UA: NEGATIVE mg/dL
HGB URINE DIPSTICK: NEGATIVE
Ketones, ur: NEGATIVE mg/dL
Leukocytes, UA: NEGATIVE
Nitrite: NEGATIVE
PROTEIN: NEGATIVE mg/dL
Specific Gravity, Urine: 1.016 (ref 1.005–1.030)
pH: 7 (ref 5.0–8.0)

## 2017-09-20 LAB — LIPASE, BLOOD: LIPASE: 33 U/L (ref 11–51)

## 2017-09-20 MED ORDER — ONDANSETRON HCL 4 MG PO TABS: 4.0000 mg | ORAL_TABLET | Freq: Three times a day (TID) | ORAL | 0 refills | Status: DC | PRN

## 2017-09-20 MED ORDER — AMOXICILLIN-POT CLAVULANATE 875-125 MG PO TABS: 1.0000 | ORAL_TABLET | Freq: Once | ORAL | Status: DC

## 2017-09-20 MED ORDER — IOPAMIDOL (ISOVUE-300) INJECTION 61%
30.0000 mL | Freq: Once | INTRAVENOUS | Status: AC | PRN
  Administered 2017-09-20: 30 mL via ORAL

## 2017-09-20 MED ORDER — SODIUM CHLORIDE 0.9 % IV BOLUS
1000.0000 mL | Freq: Once | INTRAVENOUS | Status: AC
  Administered 2017-09-20: 1000 mL via INTRAVENOUS

## 2017-09-20 MED ORDER — AMOXICILLIN-POT CLAVULANATE 875-125 MG PO TABS: 1.0000 | ORAL_TABLET | Freq: Two times a day (BID) | ORAL | 0 refills | Status: AC

## 2017-09-20 MED ORDER — AMOXICILLIN-POT CLAVULANATE 875-125 MG PO TABS: 1.0000 | ORAL_TABLET | Freq: Two times a day (BID) | ORAL | 0 refills | Status: DC

## 2017-09-20 MED ORDER — IOPAMIDOL (ISOVUE-300) INJECTION 61%
100.0000 mL | Freq: Once | INTRAVENOUS | Status: AC | PRN
  Administered 2017-09-20: 100 mL via INTRAVENOUS

## 2017-09-20 MED ORDER — OXYCODONE-ACETAMINOPHEN 5-325 MG PO TABS: 1.0000 | ORAL_TABLET | ORAL | 0 refills | Status: DC | PRN

## 2017-09-20 NOTE — ED Triage Notes (Signed)
Pt here for left lower abdominal pain. Feels like when gets diverticulitis.  Soft stool but no diarrhea.  No vomiting. No fevers.  NAD. VSS

## 2017-09-20 NOTE — Discharge Instructions (Addendum)
You would prefer to treat this at home which is not unreasonable.  However, it does mean that you need to be vigilant about your health.  If you have increased pain, vomiting, fever or any other concerns please return to the emergency room.  Take the antibiotics until they are gone.  Take the pain medications as prescribed.  Not drive on these medications.  If you have any other new or worrisome symptoms, return to the emergency room.  Follow closely with your primary care doctor in the next few days and surgery as needed.

## 2017-09-20 NOTE — ED Provider Notes (Signed)
[facility] Emergency Department Provider Note  ____________________________________________   I have reviewed the triage vital signs and the nursing notes. Where available I have reviewed prior notes and, if possible and indicated, outside hospital notes.    HISTORY  Chief Complaint Abdominal Pain    HPI Evelyn Simon is a 62 y.o. female   Today complaining of abdominal pain.  Has had abdominal pain she states for the last for 5 days getting gradually worse.  Does have a history of comp gated diverticulitis in the past.  Denies any significant abdominal surgeries, aside from C-section remotely.  States the pain is a gradual onset, lower abdominal discomfort more on the left than the right, no dysuria no urinary symptoms no vomiting but positive nausea no fever.  Feels like a bloating discomfort.  Radiates "everywhere".  Does not want pain medications at this time.  Patient rates the pain is moderate.  Higher treatment no other alleviating or aggravating symptoms   Past Medical History:  Diagnosis Date  . Arthritis   . Asthma   . Diverticulosis of colon   . GERD (gastroesophageal reflux disease)   . Headache    H/O MIGRAINES  . Hypertension   . Hypothyroidism     Patient Active Problem List   Diagnosis Date Noted  . Mastalgia 11/15/2016  . Weight gain 11/15/2016  . Diverticulitis 10/29/2016  . Healthcare maintenance 08/15/2016  . Acquired hypothyroidism 06/20/2016  . Trigger finger of left thumb 06/17/2016  . Coronary artery disease involving native coronary artery of native heart without angina pectoris 05/21/2016  . Benign essential hypertension 04/19/2016  . Heart palpitations 04/19/2016  . Dyspepsia   . Diarrhea   . Diverticulosis of large intestine without diverticulitis   . Dupuytren's contracture 11/16/2015  . Arthritis of hand 07/25/2015  . 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 Depressive disorder 08/23/2013  . Sprain and strain of  cruciate ligament of knee 08/23/2013  . Symptomatic menopausal or female climacteric states 07/22/2013  . Asthma 07/12/2013  . Diverticulosis of colon 07/12/2013  . Generalized osteoarthritis of multiple sites 07/12/2013  . Other and unspecified hyperlipidemia 07/12/2013    Past Surgical History:  Procedure Laterality Date  . CESAREAN SECTION     X3  . COLONOSCOPY WITH PROPOFOL N/A 12/21/2015   Procedure: COLONOSCOPY WITH PROPOFOL;  Surgeon: Wyline Mood, MD;  Location: [facility];  Service: Endoscopy;  Laterality: N/A;  . COLONOSCOPY WITH PROPOFOL N/A 12/16/2016   Procedure: COLONOSCOPY WITH PROPOFOL;  Surgeon: Wyline Mood, MD;  Location: [facility] ENDOSCOPY;  Service: Gastroenterology;  Laterality: N/A;  . DIAGNOSTIC LAPAROSCOPY    . DILATION AND CURETTAGE OF UTERUS    . ESOPHAGOGASTRODUODENOSCOPY (EGD) WITH PROPOFOL N/A 12/21/2015   Procedure: ESOPHAGOGASTRODUODENOSCOPY (EGD) WITH PROPOFOL;  Surgeon: Wyline Mood, MD;  Location: [facility];  Service: Endoscopy;  Laterality: N/A;  . ESOPHAGOGASTRODUODENOSCOPY (EGD) WITH PROPOFOL N/A 01/02/2016   Procedure: ESOPHAGOGASTRODUODENOSCOPY (EGD) WITH PROPOFOL;  Surgeon: Midge Minium, MD;  Location: [facility] END[facility];  Service: Endoscopy;  Laterality: N/A;  . EXCISION MORTON'S NEUROMA Right   . FINGER ARTHROPLASTY Left 08/21/2015   Procedure: LEFT THUMB  ARTHROPLASTY (CMC);  Surgeon: Deeann Saint, MD;  Location: [facility] ORS;  Service: Orthopedics;  Laterality: Left;  . morton neuromas removal right foot    . TUBAL LIGATION    . TUMMY TUCK  2014    Prior to Admission medications   Medication Sig Start Date End Date Taking? Authorizing Provider  5-HYDROXYTRYPTOPHAN PO Take 1 tablet by mouth daily.  [provider]  amoxicillin-clavulanate (AUGMENTIN) 875-125 MG tablet Take 1 tablet by mouth 2 (two) times daily. 10/18/16   [provider]  azelastine (ASTELIN) 0.1 % nasal spray Place 1 spray into both nostrils 2 (two) times daily. Use  in each nostril as directed    [provider]  BIOTIN PO Take 1 tablet by mouth daily.    [provider]  BLACK COHOSH PO Take 1 tablet by mouth daily.    [provider]  cefUROXime (CEFTIN) 500 MG tablet take 1 tablet by mouth twice a day for 7 days 10/21/16   [provider]  docusate sodium (COLACE) 100 MG capsule Take 100 mg by mouth 2 (two) times daily. 10/18/16   [provider]  gabapentin (NEURONTIN) 400 MG capsule Take 1 capsule (400 mg total) by mouth 3 (three) times daily. 08/21/15   Deeann Saint, MD  HYDROcodone-acetaminophen (NORCO/VICODIN) 5-325 MG tablet take 1 tablet by mouth every 6 hours if needed for severe pain 10/18/16   [provider]  levocetirizine (XYZAL) 5 MG tablet Take 5 mg by mouth every evening.    [provider]  Milk Thistle 1000 MG CAPS Take 1 capsule by mouth 1 day or 1 dose.    [provider]  Misc Natural Products (PUMPKIN SEED OIL) CAPS Take 1 capsule by mouth daily.    [provider]  omeprazole (PRILOSEC) 10 MG capsule Take 1 capsule by mouth 1 day or 1 dose.    [provider]  VENTOLIN HFA 108 (90 Base) MCG/ACT inhaler inhale 2 puffs by mouth PRN 07/26/15   [provider]    Allergies Latex; Ergotamine-caffeine; Other; Pineapple; Soy allergy; Crestor [rosuvastatin]; and Ultram [tramadol]  Family History  Problem Relation Age of Onset  . Breast cancer Maternal Aunt   . Stroke Mother   . Heart disease Mother   . Cancer Brother        LUNG  . Heart disease Father     Social History Social History   Tobacco Use  . Smoking status: Former Smoker    Types: Cigarettes  . Smokeless tobacco: Never Used  Substance Use Topics  . Alcohol use: Yes    Alcohol/week: 2.0 standard drinks    Types: 2 Glasses of wine per week    Comment: RED WINE DAILY,none 24hrs  . Drug use: No    Review of Systems Constitutional: No fever/chills Eyes: No visual  changes. ENT: No sore throat. No stiff neck no neck pain Cardiovascular: Denies chest pain. Respiratory: Denies shortness of breath. Gastrointestinal:   no vomiting.  No diarrhea.  No constipation. Genitourinary: Negative for dysuria. Musculoskeletal: Negative lower extremity swelling Skin: Negative for rash. Neurological: Negative for severe headaches, focal weakness or numbness.   ____________________________________________   PHYSICAL EXAM:  VITAL SIGNS: ED Triage Vitals  Enc Vitals Group     BP 09/20/17 0936 (!) 175/81     Pulse Rate 09/20/17 0935 84     Resp 09/20/17 0935 16     Temp 09/20/17 0935 98.5 F (36.9 C)     Temp Source 09/20/17 0935 Oral     SpO2 09/20/17 0935 97 %     Weight 09/20/17 0936 170 lb (77.1 kg)     Height 09/20/17 0936 5\' 2"  (1.575 m)     Head Circumference --      Peak Flow --      Pain Score 09/20/17 0935 4     Pain Loc --  Pain Edu? --      Excl. in GC? --     Constitutional: Alert and oriented. Well appearing and in no acute distress. Eyes: Conjunctivae are normal Head: Atraumatic HEENT: No congestion/rhinnorhea. Mucous membranes are moist.  Oropharynx non-erythematous Neck:   Nontender with no meningismus, no masses, no stridor Cardiovascular: Normal rate, regular rhythm. Grossly normal heart sounds.  Good peripheral circulation. Respiratory: Normal respiratory effort.  No retractions. Lungs CTAB. Abdominal: Soft and diffuse lower tenderness noted more on the left than the right with voluntary guarding. No distention.no rebound Back:  There is no focal tenderness or step off.  there is no midline tenderness there are no lesions noted. there is no CVA tenderness  Musculoskeletal: No lower extremity tenderness, no upper extremity tenderness. No joint effusions, no DVT signs strong distal pulses no edema Neurologic:  Normal speech and language. No gross focal neurologic deficits are appreciated.  Skin:  Skin is warm, dry and intact. No  rash noted. Psychiatric: Mood and affect are normal. Speech and behavior are normal.  ____________________________________________   LABS (all labs ordered are listed, but only abnormal results are displayed)  Labs Reviewed  COMPREHENSIVE METABOLIC PANEL - Abnormal; Notable for the following components:      Result Value   Glucose, Bld 100 (*)    All other components within normal limits  URINALYSIS, COMPLETE (UACMP) WITH MICROSCOPIC - Abnormal; Notable for the following components:   Color, Urine YELLOW (*)    APPearance CLEAR (*)    All other components within normal limits  LIPASE, BLOOD  CBC    Pertinent labs  results that were available during my care of the patient were reviewed by me and considered in my medical decision making (see chart for details). ____________________________________________  EKG  I personally interpreted any EKGs ordered by me or triage  ____________________________________________  RADIOLOGY  Pertinent labs & imaging results that were available during my care of the patient were reviewed by me and considered in my medical decision making (see chart for details). If possible, patient and/or family made aware of any abnormal findings.  No results found. ____________________________________________    PROCEDURES  Procedure(s) performed: None  Procedures  Critical Care performed: None  ____________________________________________   INITIAL IMPRESSION / ASSESSMENT AND PLAN / ED COURSE  Pertinent labs & imaging results that were available during my care of the patient were reviewed by me and considered in my medical decision making (see chart for details).  Patient here with lower abdominal pain, white count is reassuring, consideration is given to diverticulitis which is the most likely etiology.  Given her age we will obtain imaging and reassess.  Blood work is reassuring vital signs are reassuring.  Patient declines pain medications at  this time.    ____________________________________________   FINAL CLINICAL IMPRESSION(S) / ED DIAGNOSES  Final diagnoses:  None      This chart was dictated using voice recognition software.  Despite best efforts to proofread,  errors can occur which can change meaning.      Jeanmarie Plant, MD 09/20/17 1310

## 2017-09-23 ENCOUNTER — Encounter: Payer: Self-pay | Admitting: Emergency Medicine

## 2017-09-23 ENCOUNTER — Emergency Department
Admission: EM | Admit: 2017-09-23 | Discharge: 2017-09-23 | Disposition: A | Discharge status: Home / Self Care | Payer: BLUE CROSS/BLUE SHIELD | Attending: Emergency Medicine | Admitting: Emergency Medicine

## 2017-09-23 ENCOUNTER — Encounter (INDEPENDENT_AMBULATORY_CARE_PROVIDER_SITE_OTHER): Payer: BLUE CROSS/BLUE SHIELD | Admitting: Vascular Surgery

## 2017-09-23 ENCOUNTER — Emergency Department: Payer: BLUE CROSS/BLUE SHIELD

## 2017-09-23 DIAGNOSIS — E039 Hypothyroidism, unspecified: Secondary | ICD-10-CM | POA: Diagnosis not present

## 2017-09-23 DIAGNOSIS — Z79899 Other long term (current) drug therapy: Secondary | ICD-10-CM | POA: Insufficient documentation

## 2017-09-23 DIAGNOSIS — I2510 Atherosclerotic heart disease of native coronary artery without angina pectoris: Secondary | ICD-10-CM | POA: Insufficient documentation

## 2017-09-23 DIAGNOSIS — R1084 Generalized abdominal pain: Secondary | ICD-10-CM | POA: Diagnosis present

## 2017-09-23 DIAGNOSIS — Z8719 Personal history of other diseases of the digestive system: Secondary | ICD-10-CM | POA: Diagnosis not present

## 2017-09-23 DIAGNOSIS — J45909 Unspecified asthma, uncomplicated: Secondary | ICD-10-CM | POA: Insufficient documentation

## 2017-09-23 DIAGNOSIS — I10 Essential (primary) hypertension: Secondary | ICD-10-CM | POA: Diagnosis not present

## 2017-09-23 DIAGNOSIS — K5732 Diverticulitis of large intestine without perforation or abscess without bleeding: Secondary | ICD-10-CM | POA: Insufficient documentation

## 2017-09-23 DIAGNOSIS — K5792 Diverticulitis of intestine, part unspecified, without perforation or abscess without bleeding: Secondary | ICD-10-CM

## 2017-09-23 LAB — COMPREHENSIVE METABOLIC PANEL
ALK PHOS: 71 U/L (ref 38–126)
ALT: 42 U/L (ref 0–44)
AST: 43 U/L — ABNORMAL HIGH (ref 15–41)
Albumin: 4.7 g/dL (ref 3.5–5.0)
Anion gap: 8 (ref 5–15)
BUN: 12 mg/dL (ref 8–23)
CALCIUM: 9.2 mg/dL (ref 8.9–10.3)
CO2: 28 mmol/L (ref 22–32)
CREATININE: 0.68 mg/dL (ref 0.44–1.00)
Chloride: 101 mmol/L (ref 98–111)
GFR calc non Af Amer: >60 mL/min (ref >60)
Glucose, Bld: 116 mg/dL — ABNORMAL HIGH (ref 70–99)
Potassium: 5 mmol/L (ref 3.5–5.1)
Sodium: 137 mmol/L (ref 135–145)
Total Bilirubin: 0.4 mg/dL (ref 0.3–1.2)
Total Protein: 8 g/dL (ref 6.5–8.1)

## 2017-09-23 LAB — URINALYSIS, COMPLETE (UACMP) WITH MICROSCOPIC
Bacteria, UA: NONE SEEN
Bilirubin Urine: NEGATIVE
Glucose, UA: NEGATIVE mg/dL
Hgb urine dipstick: NEGATIVE
Ketones, ur: NEGATIVE mg/dL
Nitrite: NEGATIVE
PH: 8 (ref 5.0–8.0)
Protein, ur: 100 mg/dL — AB
SPECIFIC GRAVITY, URINE: 1.016 (ref 1.005–1.030)

## 2017-09-23 LAB — CBC
HCT: 43.2 % (ref 35.0–47.0)
Hemoglobin: 15.1 g/dL (ref 12.0–16.0)
MCH: 33.8 pg (ref 26.0–34.0)
MCHC: 34.8 g/dL (ref 32.0–36.0)
MCV: 97.1 fL (ref 80.0–100.0)
Platelets: 419 10*3/uL (ref 150–440)
RBC: 4.45 MIL/uL (ref 3.80–5.20)
RDW: 13.4 % (ref 11.5–14.5)
WBC: 9 10*3/uL (ref 3.6–11.0)

## 2017-09-23 LAB — LIPASE, BLOOD: Lipase: 29 U/L (ref 11–51)

## 2017-09-23 MED ORDER — DOCUSATE SODIUM 100 MG PO CAPS: 100.0000 mg | ORAL_CAPSULE | Freq: Two times a day (BID) | ORAL | 2 refills | Status: AC

## 2017-09-23 MED ORDER — HYDROCODONE-ACETAMINOPHEN 5-325 MG PO TABS
1.0000 | ORAL_TABLET | Freq: Once | ORAL | Status: AC
  Administered 2017-09-23: 1 via ORAL
  Filled 2017-09-23: qty 1

## 2017-09-23 MED ORDER — PROMETHAZINE HCL 25 MG PO TABS: 25.0000 mg | ORAL_TABLET | Freq: Four times a day (QID) | ORAL | 0 refills | Status: DC | PRN

## 2017-09-23 MED ORDER — HYDROCODONE-ACETAMINOPHEN 5-325 MG PO TABS: 1.0000 | ORAL_TABLET | ORAL | 0 refills | Status: DC | PRN

## 2017-09-23 MED ORDER — DOCUSATE SODIUM 100 MG PO CAPS
100.0000 mg | ORAL_CAPSULE | Freq: Once | ORAL | Status: AC
  Administered 2017-09-23: 100 mg via ORAL
  Filled 2017-09-23: qty 1

## 2017-09-23 MED ORDER — PROMETHAZINE HCL 25 MG PO TABS
25.0000 mg | ORAL_TABLET | Freq: Once | ORAL | Status: AC
  Administered 2017-09-23: 25 mg via ORAL
  Filled 2017-09-23: qty 1

## 2017-09-23 NOTE — ED Provider Notes (Signed)
[facility] Emergency Department Provider Note  Time seen: 2:13 PM  I have reviewed the triage vital signs and the nursing notes.   HISTORY  Chief Complaint Abdominal Pain    HPI Evelyn Simon is a 62 y.o. female with a past medical history of asthma, arthritis, hypertension, presents to the emergency department for abdominal pain and nausea.  According to the patient she was diagnosed with diverticulitis 3 days ago and placed on antibiotics.  Patient states she is only taken a few of the pain pills because she did not want to get constipated.  States she tried Zofran but it has not been working for her for her nausea.  States she had been constipated up until this morning when she had a larger bowel movement.  She states since having a bowel movement she has had somewhat increased pain as well as nausea.  Did not take any pain medication because she did not want to be constipated again.  Patient denies any fever.  States mild to moderate abdominal pain which she describes more as generalized and "bloating" type pain.  States her main concern currently is feeling nauseated.   Past Medical History:  Diagnosis Date  . Arthritis   . Asthma   . Diverticulosis of colon   . GERD (gastroesophageal reflux disease)   . Headache    H/O MIGRAINES  . Hypertension   . Hypothyroidism     Patient Active Problem List   Diagnosis Date Noted  . Mastalgia 11/15/2016  . Weight gain 11/15/2016  . Diverticulitis 10/29/2016  . Healthcare maintenance 08/15/2016  . Acquired hypothyroidism 06/20/2016  . Trigger finger of left thumb 06/17/2016  . Coronary artery disease involving native coronary artery of native heart without angina pectoris 05/21/2016  . Benign essential hypertension 04/19/2016  . Heart palpitations 04/19/2016  . Dyspepsia   . Diarrhea   . Diverticulosis of large intestine without diverticulitis   . Dupuytren's contracture 11/16/2015  . Arthritis of hand  07/25/2015  . 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 Depressive disorder 08/23/2013  . Sprain and strain of cruciate ligament of knee 08/23/2013  . Symptomatic menopausal or female climacteric states 07/22/2013  . Asthma 07/12/2013  . Diverticulosis of colon 07/12/2013  . Generalized osteoarthritis of multiple sites 07/12/2013  . Other and unspecified hyperlipidemia 07/12/2013    Past Surgical History:  Procedure Laterality Date  . CESAREAN SECTION     X3  . COLONOSCOPY WITH PROPOFOL N/A 12/21/2015   Procedure: COLONOSCOPY WITH PROPOFOL;  Surgeon: Wyline Mood, MD;  Location: [facility] ENDOSCOPY;  Service: Endoscopy;  Laterality: N/A;  . COLONOSCOPY WITH PROPOFOL N/A 12/16/2016   Procedure: COLONOSCOPY WITH PROPOFOL;  Surgeon: Wyline Mood, MD;  Location: [facility] ENDOSCOPY;  Service: Gastroenterology;  Laterality: N/A;  . DIAGNOSTIC LAPAROSCOPY    . DILATION AND CURETTAGE OF UTERUS    . ESOPHAGOGASTRODUODENOSCOPY (EGD) WITH PROPOFOL N/A 12/21/2015   Procedure: ESOPHAGOGASTRODUODENOSCOPY (EGD) WITH PROPOFOL;  Surgeon: Wyline Mood, MD;  Location: [facility] ENDOSCOPY;  Service: Endoscopy;  Laterality: N/A;  . ESOPHAGOGASTRODUODENOSCOPY (EGD) WITH PROPOFOL N/A 01/02/2016   Procedure: ESOPHAGOGASTRODUODENOSCOPY (EGD) WITH PROPOFOL;  Surgeon: Midge Minium, MD;  Location: [facility] ENDOSCOPY;  Service: Endoscopy;  Laterality: N/A;  . EXCISION MORTON'S NEUROMA Right   . FINGER ARTHROPLASTY Left 08/21/2015   Procedure: LEFT THUMB  ARTHROPLASTY (CMC);  Surgeon: Deeann Saint, MD;  Location: [facility] ORS;  Service: Orthopedics;  Laterality: Left;  . morton neuromas removal right foot    . TUBAL LIGATION    .  TUMMY TUCK  2014    Prior to Admission medications   Medication Sig Start Date End Date Taking? Authorizing Provider  5-HYDROXYTRYPTOPHAN PO Take 1 tablet by mouth daily.    [provider]  amoxicillin-clavulanate (AUGMENTIN) 875-125 MG tablet Take 1 tablet by mouth 2 (two) times daily for 10 days. 09/20/17  09/30/17  Jeanmarie Plant, MD  azelastine (ASTELIN) 0.1 % nasal spray Place 1 spray into both nostrils 2 (two) times daily. Use in each nostril as directed    [provider]  BIOTIN PO Take 1 tablet by mouth daily.    [provider]  BLACK COHOSH PO Take 1 tablet by mouth daily.    [provider]  cefUROXime (CEFTIN) 500 MG tablet take 1 tablet by mouth twice a day for 7 days 10/21/16   [provider]  docusate sodium (COLACE) 100 MG capsule Take 100 mg by mouth 2 (two) times daily. 10/18/16   [provider]  gabapentin (NEURONTIN) 400 MG capsule Take 1 capsule (400 mg total) by mouth 3 (three) times daily. 08/21/15   Deeann Saint, MD  HYDROcodone-acetaminophen (NORCO/VICODIN) 5-325 MG tablet take 1 tablet by mouth every 6 hours if needed for severe pain 10/18/16   [provider]  levocetirizine (XYZAL) 5 MG tablet Take 5 mg by mouth every evening.    [provider]  Milk Thistle 1000 MG CAPS Take 1 capsule by mouth 1 day or 1 dose.    [provider]  Misc Natural Products (PUMPKIN SEED OIL) CAPS Take 1 capsule by mouth daily.    [provider]  omeprazole (PRILOSEC) 10 MG capsule Take 1 capsule by mouth 1 day or 1 dose.    [provider]  ondansetron (ZOFRAN) 4 MG tablet Take 1 tablet (4 mg total) by mouth every 8 (eight) hours as needed for nausea or vomiting. 09/20/17   Jeanmarie Plant, MD  oxyCODONE-acetaminophen (PERCOCET) 5-325 MG tablet Take 1 tablet by mouth every 4 (four) hours as needed for severe pain. 09/20/17   Jeanmarie Plant, MD  VENTOLIN HFA 108 [phone] Base) MCG/ACT inhaler inhale 2 puffs by mouth PRN 07/26/15   [provider]    Allergies  Allergen Reactions  . Latex Anaphylaxis  . Ergotamine-Caffeine Hives    Body rash  . Other Hives and Itching     AVOCADO ALL DAIRY PRODUCTS AND AVOCADO  . Pineapple Other (See Comments)    MOUTH SORES  . Soy Allergy Hives  . Crestor  [Rosuvastatin] Itching    ONLY WITH THE GENERIC  STATES NO ALLERGY  . Ultram [Tramadol] Itching    PT CAN TAKE TRAMADOL BUT CANNOT TAKE GENERIC  STATES NO ALLERGY     Family History  Problem Relation Age of Onset  . Breast cancer Maternal Aunt   . Stroke Mother   . Heart disease Mother   . Cancer Brother        LUNG  . Heart disease Father     Social History Social History   Tobacco Use  . Smoking status: Former Smoker    Types: Cigarettes  . Smokeless tobacco: Never Used  Substance Use Topics  . Alcohol use: Yes    Alcohol/week: 2.0 standard drinks    Types: 2 Glasses of wine per week    Comment: RED WINE DAILY,none 24hrs  . Drug use: No    Review of Systems Constitutional: Negative for fever. Cardiovascular: Negative for chest pain. Respiratory: Negative for  shortness of breath. Gastrointestinal: Mildly increased abdominal pain/bloating per patient with nausea.  Denies vomiting. Genitourinary: Negative for urinary compaints Musculoskeletal: Negative for musculoskeletal complaints Skin: Negative for skin complaints  Neurological: Negative for headache All other ROS negative  ____________________________________________   PHYSICAL EXAM:  VITAL SIGNS: ED Triage Vitals  Enc Vitals Group     BP 09/23/17 1256 (!) 189/91     Pulse Rate 09/23/17 1256 84     Resp 09/23/17 1256 14     Temp 09/23/17 1256 98.1 F (36.7 C)     Temp Source 09/23/17 1256 Oral     SpO2 09/23/17 1256 98 %     Weight 09/23/17 1257 170 lb (77.1 kg)     Height 09/23/17 1257 5\' 2"  (1.575 m)     Head Circumference --      Peak Flow --      Pain Score 09/23/17 1256 5     Pain Loc --      Pain Edu? --      Excl. in GC? --     Constitutional: Alert and oriented. Well appearing and in no distress. Eyes: Normal exam ENT   Head: Normocephalic and atraumatic.   Mouth/Throat: Mucous membranes are moist. Cardiovascular: Normal rate, regular rhythm. No murmur Respiratory: Normal  respiratory effort without tachypnea nor retractions. Breath sounds are clear  Gastrointestinal: Soft and nontender. No distention.   Musculoskeletal: Nontender with normal range of motion in all extremities. Neurologic:  Normal speech and language. No gross focal neurologic deficits Skin:  Skin is warm, dry and intact.  Psychiatric: Mood and affect are normal.   ____________________________________________    RADIOLOGY  X-ray negative for acute abnormality  ____________________________________________   INITIAL IMPRESSION / ASSESSMENT AND PLAN / ED COURSE  Pertinent labs & imaging results that were available during my care of the patient were reviewed by me and considered in my medical decision making (see chart for details).  Patient presents to the emergency department for abdominal pain and nausea.  Patient was diagnosed with diverticulitis 3 days ago by CT scan which I have reviewed.  Patient's labs are largely nonrevealing today with a normal white blood cell count remains afebrile with normal vitals.  We will dose Phenergan and pain medication for pain control we will dose Colace to help keep the patient from getting constipated.  We will also obtain a three-way abdominal x-ray as a precaution.  Given her physical examination I do not suspect perforation, especially with a CT scan performed 3 days ago with normal vitals are normal white blood cell count I would be hesitant to repeat CT imaging so soon due to radiation risk.  X-ray negative for acute abnormality.  We will dose Phenergan Norco and Colace.  We will discharge with the same medications.  Patient agreeable to plan of care.  I did discuss return precautions for worsening abdominal pain or fever.  ____________________________________________   FINAL CLINICAL IMPRESSION(S) / ED DIAGNOSES  Abdominal pain Nausea Diverticulitis    Minna Antis, MD 09/23/17 1531

## 2017-09-23 NOTE — ED Notes (Signed)
Pt back from imaging.  No acute distress.

## 2017-09-23 NOTE — ED Triage Notes (Signed)
Pt was seen Saturday and diagnosed with diverticulitis. Pt states she was sent home with antibiotics, pain medication and Zofran. Pt denies chest pain or shortness of breath. Pt states she has been unable to control nausea and pain at home and was told to come back to ED if she felt worse.

## 2017-10-03 ENCOUNTER — Ambulatory Visit
Admission: RE | Admit: 2017-10-03 | Discharge: 2017-10-03 | Disposition: A | Discharge status: Home / Self Care | Payer: BLUE CROSS/BLUE SHIELD | Source: Ambulatory Visit | Attending: Family Medicine | Admitting: Family Medicine

## 2017-10-03 ENCOUNTER — Other Ambulatory Visit: Payer: Self-pay | Admitting: Family Medicine

## 2017-10-03 DIAGNOSIS — R1032 Left lower quadrant pain: Secondary | ICD-10-CM | POA: Diagnosis present

## 2017-10-03 DIAGNOSIS — K5792 Diverticulitis of intestine, part unspecified, without perforation or abscess without bleeding: Secondary | ICD-10-CM

## 2017-10-03 DIAGNOSIS — R109 Unspecified abdominal pain: Secondary | ICD-10-CM

## 2017-10-03 DIAGNOSIS — K5730 Diverticulosis of large intestine without perforation or abscess without bleeding: Secondary | ICD-10-CM | POA: Diagnosis not present

## 2017-10-03 DIAGNOSIS — I700 Atherosclerosis of aorta: Secondary | ICD-10-CM | POA: Diagnosis not present

## 2017-10-03 MED ORDER — IOPAMIDOL (ISOVUE-300) INJECTION 61%
100.0000 mL | Freq: Once | INTRAVENOUS | Status: AC | PRN
  Administered 2017-10-03: 100 mL via INTRAVENOUS

## 2017-10-07 ENCOUNTER — Ambulatory Visit (INDEPENDENT_AMBULATORY_CARE_PROVIDER_SITE_OTHER): Payer: BLUE CROSS/BLUE SHIELD | Admitting: Vascular Surgery

## 2017-10-07 ENCOUNTER — Encounter (INDEPENDENT_AMBULATORY_CARE_PROVIDER_SITE_OTHER): Payer: Self-pay | Admitting: Vascular Surgery

## 2017-10-07 VITALS — BP 156/81 | HR 73 | Resp 16 | Ht 62.0 in | Wt 168.0 lb

## 2017-10-07 DIAGNOSIS — I10 Essential (primary) hypertension: Secondary | ICD-10-CM | POA: Diagnosis not present

## 2017-10-07 DIAGNOSIS — I6529 Occlusion and stenosis of unspecified carotid artery: Secondary | ICD-10-CM | POA: Insufficient documentation

## 2017-10-07 DIAGNOSIS — I6523 Occlusion and stenosis of bilateral carotid arteries: Secondary | ICD-10-CM | POA: Diagnosis not present

## 2017-10-07 NOTE — Progress Notes (Signed)
Patient ID: Evelyn Simon, female   DOB: March 27, 1955, 62 y.o.   MRN: [id number]  Chief Complaint  Patient presents with  . New Patient (Initial Visit)    ref Ouida Sills for carotid stenosis    HPI Evelyn Simon is a 62 y.o. female.  I am asked to see the patient by Dr. Ouida Sills for evaluation of carotid stenosis.  The patient reports 6 to 8 months of persistent dizziness and lightheadedness.  There is notation of left-sided weakness but she attributes this to being very right-hand dominant.  This has been going on for many years.  She denies any temporary monocular blindness, speech or swallowing difficulty, or focal arm or leg weakness or numbness that is new.  She underwent a CT scan of the head which was unrevealing.  She then underwent a carotid duplex by her primary care physician which interpreted bilateral 50 to 69% carotid artery stenosis with antegrade vertebral arteries bilaterally.  Her velocities are clearly on the lower end of this range, and by our grading criteria she would be in the 40 to 59% range.   Past Medical History:  Diagnosis Date  . Arthritis   . Asthma   . Diverticulosis of colon   . GERD (gastroesophageal reflux disease)   . Headache    H/O MIGRAINES  . Hypertension   . Hypothyroidism     Past Surgical History:  Procedure Laterality Date  . CESAREAN SECTION     X3  . COLONOSCOPY WITH PROPOFOL N/A 12/21/2015   Procedure: COLONOSCOPY WITH PROPOFOL;  Surgeon: Jonathon Bellows, MD;  Location: [facility] ENDOSCOPY;  Service: Endoscopy;  Laterality: N/A;  . COLONOSCOPY WITH PROPOFOL N/A 12/16/2016   Procedure: COLONOSCOPY WITH PROPOFOL;  Surgeon: Jonathon Bellows, MD;  Location: [facility] ENDOSCOPY;  Service: Gastroenterology;  Laterality: N/A;  . DIAGNOSTIC LAPAROSCOPY    . DILATION AND CURETTAGE OF UTERUS    . ESOPHAGOGASTRODUODENOSCOPY (EGD) WITH PROPOFOL N/A 12/21/2015   Procedure: ESOPHAGOGASTRODUODENOSCOPY (EGD) WITH PROPOFOL;  Surgeon: Jonathon Bellows, MD;  Location: [facility] ENDOSCOPY;   Service: Endoscopy;  Laterality: N/A;  . ESOPHAGOGASTRODUODENOSCOPY (EGD) WITH PROPOFOL N/A 01/02/2016   Procedure: ESOPHAGOGASTRODUODENOSCOPY (EGD) WITH PROPOFOL;  Surgeon: Lucilla Lame, MD;  Location: [facility] ENDOSCOPY;  Service: Endoscopy;  Laterality: N/A;  . EXCISION MORTON'S NEUROMA Right   . FINGER ARTHROPLASTY Left 08/21/2015   Procedure: LEFT THUMB  ARTHROPLASTY ([place]);  Surgeon: Earnestine Leys, MD;  Location: [facility] ORS;  Service: Orthopedics;  Laterality: Left;  . morton neuromas removal right foot    . TUBAL LIGATION    . TUMMY TUCK  2014    Family History  Problem Relation Age of Onset  . Breast cancer Maternal Aunt   . Stroke Mother   . Heart disease Mother   . Cancer Brother        LUNG  . Heart disease Father     Social History Social History   Tobacco Use  . Smoking status: Former Smoker    Types: Cigarettes  . Smokeless tobacco: Never Used  Substance Use Topics  . Alcohol use: Yes    Alcohol/week: 2.0 standard drinks    Types: 2 Glasses of wine per week    Comment: RED WINE DAILY,none 24hrs  . Drug use: No    Allergies  Allergen Reactions  . Latex Anaphylaxis  . Ergotamine-Caffeine Hives    Body rash  . Other Hives and Itching     AVOCADO ALL DAIRY PRODUCTS AND AVOCADO  . Pineapple Other (See Comments)  MOUTH SORES  . Soy Allergy Hives  . Crestor [Rosuvastatin] Itching    ONLY WITH THE GENERIC  STATES NO ALLERGY  . Ultram [Tramadol] Itching    PT CAN TAKE TRAMADOL BUT CANNOT TAKE GENERIC  STATES NO ALLERGY     Current Outpatient Medications  Medication Sig Dispense Refill  . BIOTIN PO Take 1 tablet by mouth daily.    Marland Kitchen BLACK COHOSH PO Take 1 tablet by mouth daily.    Marland Kitchen docusate sodium (COLACE) 100 MG capsule Take 1 capsule (100 mg total) by mouth 2 (two) times daily. 60 capsule 2  . gabapentin (NEURONTIN) 400 MG capsule Take 1 capsule (400 mg total) by mouth 3 (three) times daily. (Patient taking differently: Take 400 mg by mouth as needed. ) 60  capsule 3  . HYDROcodone-acetaminophen (NORCO/VICODIN) 5-325 MG tablet Take 1 tablet by mouth every 4 (four) hours as needed for moderate pain. 15 tablet 0  . levocetirizine (XYZAL) 5 MG tablet Take 5 mg by mouth every evening.    . Milk Thistle 1000 MG CAPS Take 1 capsule by mouth 1 day or 1 dose.    . Misc Natural Products (PUMPKIN SEED OIL) CAPS Take 1 capsule by mouth daily.    . promethazine (PHENERGAN) 25 MG tablet Take 1 tablet (25 mg total) by mouth every 6 (six) hours as needed for nausea or vomiting. 20 tablet 0  . VENTOLIN HFA 108 (90 Base) MCG/ACT inhaler inhale 2 puffs by mouth PRN  0  . 5-HYDROXYTRYPTOPHAN PO Take 1 tablet by mouth daily.    Marland Kitchen azelastine (ASTELIN) 0.1 % nasal spray Place 1 spray into both nostrils 2 (two) times daily. Use in each nostril as directed    . cefUROXime (CEFTIN) 500 MG tablet take 1 tablet by mouth twice a day for 7 days  0  . omeprazole (PRILOSEC) 10 MG capsule Take 1 capsule by mouth 1 day or 1 dose.    . ondansetron (ZOFRAN) 4 MG tablet Take 1 tablet (4 mg total) by mouth every 8 (eight) hours as needed for nausea or vomiting. (Patient not taking: Reported on 10/07/2017) 8 tablet 0  . oxyCODONE-acetaminophen (PERCOCET) 5-325 MG tablet Take 1 tablet by mouth every 4 (four) hours as needed for severe pain. (Patient not taking: Reported on 10/07/2017) 8 tablet 0   No current facility-administered medications for this visit.       REVIEW OF SYSTEMS (Negative unless checked)  Constitutional: '[]'$ Weight loss  '[]'$ Fever  '[]'$ Chills Cardiac: '[]'$ Chest pain   '[]'$ Chest pressure   '[]'$ Palpitations   '[]'$ Shortness of breath when laying flat   '[]'$ Shortness of breath at rest   '[]'$ Shortness of breath with exertion. Vascular:  '[]'$ Pain in legs with walking   '[]'$ Pain in legs at rest   '[]'$ Pain in legs when laying flat   '[]'$ Claudication   '[]'$ Pain in feet when walking  '[]'$ Pain in feet at rest  '[]'$ Pain in feet when laying flat   '[]'$ History of DVT   '[]'$ Phlebitis   '[]'$ Swelling in legs   '[]'$ Varicose  veins   '[]'$ Non-healing ulcers Pulmonary:   '[]'$ Uses home oxygen   '[]'$ Productive cough   '[]'$ Hemoptysis   '[]'$ Wheeze  '[]'$ COPD   '[]'$ Asthma Neurologic:  '[x]'$ Dizziness  '[]'$ Blackouts   '[]'$ Seizures   '[]'$ History of stroke   '[]'$ History of TIA  '[]'$ Aphasia   '[]'$ Temporary blindness   '[]'$ Dysphagia   '[]'$ Weakness or numbness in arms   '[]'$ Weakness or numbness in legs Musculoskeletal:  '[x]'$ Arthritis   '[]'$ Joint swelling   '[]'$ Joint pain   '[]'$   Low back pain Hematologic:  '[]'$ Easy bruising  '[]'$ Easy bleeding   '[]'$ Hypercoagulable state   '[]'$ Anemic  '[]'$ Hepatitis Gastrointestinal:  '[]'$ Blood in stool   '[]'$ Vomiting blood  '[x]'$ Gastroesophageal reflux/heartburn   '[]'$ Abdominal pain Genitourinary:  '[]'$ Chronic kidney disease   '[]'$ Difficult urination  '[]'$ Frequent urination  '[]'$ Burning with urination   '[]'$ Hematuria Skin:  '[]'$ Rashes   '[]'$ Ulcers   '[]'$ Wounds Psychological:  '[]'$ History of anxiety   '[]'$  History of major depression.    Physical Exam BP (!) 156/81 (BP Location: Right Arm)   Pulse 73   Resp 16   Ht '5\' 2"'$  (1.575 m)   Wt 168 lb (76.2 kg)   BMI 30.73 kg/m  Gen:  WD/WN, NAD Head: [place]/AT, No temporalis wasting.  Ear/Nose/Throat: Hearing grossly intact, nares w/o erythema or drainage, oropharynx w/o Erythema/Exudate Eyes: Conjunctiva clear, sclera non-icteric  Neck: trachea midline. Soft right carotid bruit Pulmonary:  Good air movement, clear to auscultation bilaterally.  Cardiac: RRR, normal S1, S2 Vascular:  Vessel Right Left  Radial Palpable Palpable                                   Gastrointestinal: soft, non-tender/non-distended.  Musculoskeletal: M/S 5/5 throughout.  Extremities without ischemic changes.  No deformity or atrophy. No edema. Neurologic: Sensation grossly intact in extremities.  Symmetrical.  Speech is fluent. Motor exam as listed above. Psychiatric: Judgment intact, Mood & affect appropriate for pt's clinical situation. Dermatologic: No rashes or ulcers noted.  No cellulitis or open wounds.   Radiology Ct Abdomen Pelvis W  Contrast  Result Date: 10/03/2017 CLINICAL DATA:  Persistent severe abdominal pain. Diagnosis of diverticulitis 1 week ago. EXAM: CT ABDOMEN AND PELVIS WITH CONTRAST TECHNIQUE: Multidetector CT imaging of the abdomen and pelvis was performed using the standard protocol following bolus administration of intravenous contrast. CONTRAST:  133m ISOVUE-300 IOPAMIDOL (ISOVUE-300) INJECTION 61% COMPARISON:  Radiographs dated 09/23/2017 and CT scans dated 09/20/2017, 10/18/2016 and 09/01/2015 FINDINGS: Lower chest: There is a stable 4.2 mm nodule in the lateral aspect of the left lower lobe on image 12 of series 4, unchanged since 2018 and slightly smaller than on the study of 2017. Heart size is normal. No pericardial effusion. Aortic atherosclerosis and scattered coronary artery calcifications. Hepatobiliary: No acute abnormality. Small focal area of fatty infiltration in the left lobe adjacent to the falciform ligament. Tiny cyst in liver parenchyma adjacent to the normal appearing gallbladder. Biliary tree is normal. Pancreas: Unremarkable. No pancreatic ductal dilatation or surrounding inflammatory changes. Spleen: Normal in size without focal abnormality. Adrenals/Urinary Tract: Adrenal glands are unremarkable. Kidneys are normal, without renal calculi, focal lesion, or hydronephrosis. Bladder is unremarkable. Stomach/Bowel: Diverticulosis of the of the entire colon without evidence of acute diverticulitis. Stomach and small bowel appear normal including the terminal ileum. Appendix is normal. Vascular/Lymphatic: Aortic atherosclerosis. No enlarged abdominal or pelvic lymph nodes. Reproductive: Uterus and bilateral adnexa are unremarkable. Other: No abdominal wall hernia or abnormality. No abdominopelvic ascites. Musculoskeletal: No acute or significant osseous findings. IMPRESSION: No acute abnormalities. Extensive diverticulosis throughout the colon without evidence of acute diverticulitis. Extensive aortic  atherosclerosis. Electronically Signed   By: JLorriane ShireM.D.   On: 10/03/2017 14:13   Ct Abdomen Pelvis W Contrast  Result Date: 09/20/2017 CLINICAL DATA:  Left lower abdominal pain. EXAM: CT ABDOMEN AND PELVIS WITH CONTRAST TECHNIQUE: Multidetector CT imaging of the abdomen and pelvis was performed using the standard protocol following bolus administration of  intravenous contrast. CONTRAST:  169m ISOVUE-300 IOPAMIDOL (ISOVUE-300) INJECTION 61% COMPARISON:  10/29/2016 FINDINGS: Lower chest: No acute abnormality. Hepatobiliary: No focal liver abnormality is seen. Hepatic steatosis. No gallstones, gallbladder wall thickening, or biliary dilatation. Pancreas: Unremarkable. No pancreatic ductal dilatation or surrounding inflammatory changes. Spleen: Normal in size without focal abnormality. Adrenals/Urinary Tract: Adrenal glands are unremarkable. Kidneys are normal, without renal calculi, focal lesion, or hydronephrosis. Bladder is unremarkable. Stomach/Bowel: Stomach is within normal limits. Appendix appears normal. No evidence of small bowel wall thickening, distention, or inflammatory changes. Diffuse colonic diverticulosis. Circumferential mucosal thickening of a long segment of descending colon and sigmoid colon may reflect diverticulitis. Minimal pericolonic inflammatory stranding. Vascular/Lymphatic: Aortic atherosclerosis. No enlarged abdominal or pelvic lymph nodes. Reproductive: Uterus and bilateral adnexa are unremarkable. Other: No abdominal wall hernia or abnormality. No abdominopelvic ascites. Musculoskeletal: No acute or significant osseous findings. IMPRESSION: Extensive colonic diverticulosis with long segment of circumferential mucosal thickening of the descending and sigmoid colon, which is likely due to diverticulitis. No evidence of rupture or abscess formation. Hepatic steatosis. Electronically Signed   By: DFidela SalisburyM.D.   On: 09/20/2017 14:30   Dg Abdomen Acute  W/chest  Result Date: 09/23/2017 CLINICAL DATA:  Three days of nausea and abdominal pain which became worse this morning. Recently diagnosed with diverticulitis. EXAM: DG ABDOMEN ACUTE W/ 1V CHEST COMPARISON:  Chest x-ray of January 20, 2015. FINDINGS: The lungs are well-expanded. There is a prominent epicardial fat pad on the right which is stable. There are coarse lung markings in the left infrahilar region little changed from a CT scan of September 20, 2017 but new since the chest x-ray of January 2017. The heart and pulmonary vascularity are normal. The mediastinum is normal in width. There is no pleural effusion. Within the abdomen there is contrast within stool in the colon. No small or large bowel obstructive pattern is observed. No free extraluminal gas collections are demonstrated. There is scattered diverticula throughout the colon. The bony structures are unremarkable. There is contrast within a normal caliber appendix. IMPRESSION: No acute intra-abdominal abnormality is observed. There is diverticulosis of the colon. Coarse lung markings in the left infrahilar region is more conspicuous than in the past and may reflect subsegmental atelectasis superimposed upon scarring. Electronically Signed   By: David  JMartiniqueM.D.   On: 09/23/2017 14:47    Labs Recent Results (from the past 2160 hour(s))  Lipase, blood     Status: None   Collection Time: 09/20/17  9:40 AM  Result Value Ref Range   Lipase 33 11 - 51 U/L    Comment: Performed at [facility] [address], [place] [address] Comprehensive metabolic panel     Status: Abnormal   Collection Time: 09/20/17  9:40 AM  Result Value Ref Range   Sodium 139 135 - 145 mmol/L   Potassium 4.2 3.5 - 5.1 mmol/L   Chloride 103 98 - 111 mmol/L   CO2 28 22 - 32 mmol/L   Glucose, Bld 100 (H) 70 - 99 mg/dL   BUN 18 8 - 23 mg/dL   Creatinine, Ser 0.79 0.44 - 1.00 mg/dL   Calcium 9.1 8.9 - 10.3 mg/dL   Total Protein 7.8 6.5 - 8.1  g/dL   Albumin 4.8 3.5 - 5.0 g/dL   AST 37 15 - 41 U/L   ALT 33 0 - 44 U/L   Alkaline Phosphatase 75 38 - 126 U/L   Total Bilirubin 0.6 0.3 - 1.2  mg/dL   GFR calc non Af Amer >60 >60 mL/min   GFR calc Af Amer >60 >60 mL/min    Comment: (NOTE) The eGFR has been calculated using the CKD EPI equation. This calculation has not been validated in all clinical situations. eGFR's persistently <60 mL/min signify possible Chronic Kidney Disease.    Anion gap 8 5 - 15    Comment: Performed at [facility], [address] [place], [place] [address]  CBC     Status: None   Collection Time: 09/20/17  9:40 AM  Result Value Ref Range   WBC 10.0 3.6 - 11.0 K/uL   RBC 4.25 3.80 - 5.20 MIL/uL   Hemoglobin 14.3 12.0 - 16.0 g/dL   HCT 41.1 35.0 - 47.0 %   MCV 96.7 80.0 - 100.0 fL   MCH 33.7 26.0 - 34.0 pg   MCHC 34.8 32.0 - 36.0 g/dL   RDW 13.2 11.5 - 14.5 %   Platelets 420 150 - 440 K/uL    Comment: Performed at [facility], [address] [place], [place] [address]  Urinalysis, Complete w Microscopic     Status: Abnormal   Collection Time: 09/20/17  9:40 AM  Result Value Ref Range   Color, Urine YELLOW (A) YELLOW   APPearance CLEAR (A) CLEAR   Specific Gravity, Urine 1.016 1.005 - 1.030   pH 7.0 5.0 - 8.0   Glucose, UA NEGATIVE NEGATIVE mg/dL   Hgb urine dipstick NEGATIVE NEGATIVE   Bilirubin Urine NEGATIVE NEGATIVE   Ketones, ur NEGATIVE NEGATIVE mg/dL   Protein, ur NEGATIVE NEGATIVE mg/dL   Nitrite NEGATIVE NEGATIVE   Leukocytes, UA NEGATIVE NEGATIVE   RBC / HPF 0-5 0 - 5 RBC/hpf   WBC, UA 0-5 0 - 5 WBC/hpf   Bacteria, UA NONE SEEN NONE SEEN   Squamous Epithelial / LPF 0-5 0 - 5    Comment: Performed at [facility], [address] [place], [place] [address]  Lipase, blood     Status: None   Collection Time: 09/23/17  1:01 PM  Result Value Ref Range   Lipase 29 11 - 51 U/L    Comment: Performed at [facility], [address]  [place], [place] [address]  Comprehensive metabolic panel     Status: Abnormal   Collection Time: 09/23/17  1:01 PM  Result Value Ref Range   Sodium 137 135 - 145 mmol/L   Potassium 5.0 3.5 - 5.1 mmol/L   Chloride 101 98 - 111 mmol/L   CO2 28 22 - 32 mmol/L   Glucose, Bld 116 (H) 70 - 99 mg/dL   BUN 12 8 - 23 mg/dL   Creatinine, Ser 0.68 0.44 - 1.00 mg/dL   Calcium 9.2 8.9 - 10.3 mg/dL   Total Protein 8.0 6.5 - 8.1 g/dL   Albumin 4.7 3.5 - 5.0 g/dL   AST 43 (H) 15 - 41 U/L   ALT 42 0 - 44 U/L   Alkaline Phosphatase 71 38 - 126 U/L   Total Bilirubin 0.4 0.3 - 1.2 mg/dL   GFR calc non Af Amer >60 >60 mL/min   GFR calc Af Amer >60 >60 mL/min    Comment: (NOTE) The eGFR has been calculated using the CKD EPI equation. This calculation has not been validated in all clinical situations. eGFR's persistently <60 mL/min signify possible Chronic Kidney Disease.    Anion gap 8 5 - 15    Comment: Performed at [facility], [address] [place], [place] [address]  CBC     Status: None   Collection Time: 09/23/17  1:01 PM  Result Value Ref Range   WBC 9.0 3.6 - 11.0 K/uL   RBC 4.45 3.80 - 5.20 MIL/uL   Hemoglobin 15.1 12.0 - 16.0 g/dL   HCT 43.2 35.0 - 47.0 %   MCV 97.1 80.0 - 100.0 fL   MCH 33.8 26.0 - 34.0 pg   MCHC 34.8 32.0 - 36.0 g/dL   RDW 13.4 11.5 - 14.5 %   Platelets 419 150 - 440 K/uL    Comment: Performed at [facility] [address] [place], [place] [address]  Urinalysis, Complete w Microscopic     Status: Abnormal   Collection Time: 09/23/17  1:01 PM  Result Value Ref Range   Color, Urine YELLOW (A) YELLOW   APPearance CLOUDY (A) CLEAR   Specific Gravity, Urine 1.016 1.005 - 1.030   pH 8.0 5.0 - 8.0   Glucose, UA NEGATIVE NEGATIVE mg/dL   Hgb urine dipstick NEGATIVE NEGATIVE   Bilirubin Urine NEGATIVE NEGATIVE   Ketones, ur NEGATIVE NEGATIVE mg/dL   Protein, ur 100 (A) NEGATIVE mg/dL   Nitrite NEGATIVE NEGATIVE   Leukocytes, UA TRACE (A)  NEGATIVE   RBC / HPF 6-10 0 - 5 RBC/hpf   WBC, UA 0-5 0 - 5 WBC/hpf   Bacteria, UA NONE SEEN NONE SEEN   Squamous Epithelial / LPF 0-5 0 - 5   Mucus PRESENT    Amorphous Crystal PRESENT     Comment: Performed at [facility], [address] [place], [place] [address]    Assessment/Plan:  Benign essential hypertension blood pressure control important in reducing the progression of atherosclerotic disease. On appropriate oral medications.   Carotid stenosis She then underwent a carotid duplex by her primary care physician which interpreted bilateral 50 to 69% carotid artery stenosis with antegrade vertebral arteries bilaterally.  Her velocities are clearly on the lower end of this range, and by our grading criteria she would be in the 40 to 59% range. Is unlikely to be the cause of her dizziness and lightheadedness although that is a possibility.  At this level, she is well below the threshold for consideration for revascularization.  I would recommend continued medical management with aspirin therapy daily.  I will check her back in 6 months with a carotid duplex.  She reports that she may be moving, in which case she should find a vascular surgeon in her new area to follow this in 6 months.      Leotis Pain 10/07/2017, 10:51 AM   This note was created with Dragon medical transcription system.  Any errors from dictation are unintentional.

## 2017-10-07 NOTE — Assessment & Plan Note (Signed)
She then underwent a carotid duplex by her primary care physician which interpreted bilateral 50 to 69% carotid artery stenosis with antegrade vertebral arteries bilaterally.  Her velocities are clearly on the lower end of this range, and by our grading criteria she would be in the 40 to 59% range. Is unlikely to be the cause of her dizziness and lightheadedness although that is a possibility.  At this level, she is well below the threshold for consideration for revascularization.  I would recommend continued medical management with aspirin therapy daily.  I will check her back in 6 months with a carotid duplex.  She reports that she may be moving, in which case she should find a vascular surgeon in her new area to follow this in 6 months.

## 2017-10-07 NOTE — Assessment & Plan Note (Signed)
blood pressure control important in reducing the progression of atherosclerotic disease. On appropriate oral medications.  

## 2017-10-07 NOTE — Patient Instructions (Signed)
Carotid Artery Disease The carotid arteries are arteries on both sides of the neck. They carry blood to the brain. Carotid artery disease is when the arteries get smaller (narrow) or get blocked. If these arteries get smaller or get blocked, you are more likely to have a stroke or warning stroke (transient ischemic attack). Follow these instructions at home:  Take medicines as told by your doctor. Make sure you understand all your medicine instructions. Do not stop your medicines without talking to your doctor first.  Follow your doctor's diet instructions. It is important to eat a healthy diet that includes plenty of: ? Fresh fruits. ? Vegetables. ? Lean meats.  Avoid: ? High-fat foods. ? High-sodium foods. ? Foods that are fried, overly processed, or have poor nutritional value.  Stay a healthy weight.  Stay active. Get at least 30 minutes of activity every day.  Do not smoke.  Limit alcohol use to: ? No more than 2 drinks a day for men. ? No more than 1 drink a day for women who are not pregnant.  Do not use illegal drugs.  Keep all doctor visits as told. Get help right away if:  You have sudden weakness or loss of feeling (numbness) on one side of the body, such as the face, arm, or leg.  You have sudden confusion.  You have trouble speaking (aphasia) or understanding.  You have sudden trouble seeing out of one or both eyes.  You have sudden trouble walking.  You have dizziness or feel like you might pass out (faint).  You have a loss of balance or your movements are not steady (uncoordinated).  You have a sudden, severe headache with no known cause.  You have trouble swallowing (dysphagia). Call your local emergency services (911 in [place] Do notdrive yourself to the clinic or hospital. This information is not intended to replace advice given to you by your health care provider. Make sure you discuss any questions you have with your health care  provider. Document Released: 12/18/2011 Document Revised: 06/08/2015 Document Reviewed: 07/01/2012 Elsevier Interactive Patient Education  2018 [employer]  

## 2018-01-27 DIAGNOSIS — M7062 Trochanteric bursitis, left hip: Secondary | ICD-10-CM | POA: Insufficient documentation

## 2018-01-31 ENCOUNTER — Other Ambulatory Visit: Payer: Self-pay

## 2018-01-31 ENCOUNTER — Emergency Department
Admission: EM | Admit: 2018-01-31 | Discharge: 2018-01-31 | Disposition: A | Discharge status: Home / Self Care | Payer: BLUE CROSS/BLUE SHIELD | Attending: Emergency Medicine | Admitting: Emergency Medicine

## 2018-01-31 ENCOUNTER — Encounter: Payer: Self-pay | Admitting: Emergency Medicine

## 2018-01-31 DIAGNOSIS — R1010 Upper abdominal pain, unspecified: Secondary | ICD-10-CM

## 2018-01-31 DIAGNOSIS — Z79899 Other long term (current) drug therapy: Secondary | ICD-10-CM | POA: Insufficient documentation

## 2018-01-31 DIAGNOSIS — I10 Essential (primary) hypertension: Secondary | ICD-10-CM | POA: Insufficient documentation

## 2018-01-31 DIAGNOSIS — Z87891 Personal history of nicotine dependence: Secondary | ICD-10-CM | POA: Insufficient documentation

## 2018-01-31 DIAGNOSIS — E039 Hypothyroidism, unspecified: Secondary | ICD-10-CM | POA: Insufficient documentation

## 2018-01-31 DIAGNOSIS — I2510 Atherosclerotic heart disease of native coronary artery without angina pectoris: Secondary | ICD-10-CM | POA: Insufficient documentation

## 2018-01-31 DIAGNOSIS — J45909 Unspecified asthma, uncomplicated: Secondary | ICD-10-CM | POA: Insufficient documentation

## 2018-01-31 DIAGNOSIS — K2970 Gastritis, unspecified, without bleeding: Secondary | ICD-10-CM

## 2018-01-31 DIAGNOSIS — Z91040 Latex allergy status: Secondary | ICD-10-CM | POA: Insufficient documentation

## 2018-01-31 LAB — URINALYSIS, COMPLETE (UACMP) WITH MICROSCOPIC
BACTERIA UA: NONE SEEN
BILIRUBIN URINE: NEGATIVE
Glucose, UA: NEGATIVE mg/dL
Hgb urine dipstick: NEGATIVE
KETONES UR: NEGATIVE mg/dL
LEUKOCYTES UA: NEGATIVE
NITRITE: NEGATIVE
PROTEIN: NEGATIVE mg/dL
Specific Gravity, Urine: 1.011 (ref 1.005–1.030)
pH: 6 (ref 5.0–8.0)

## 2018-01-31 LAB — CBC
HCT: 38.8 % (ref 36.0–46.0)
HEMOGLOBIN: 12.7 g/dL (ref 12.0–15.0)
MCH: 32.2 pg (ref 26.0–34.0)
MCHC: 32.7 g/dL (ref 30.0–36.0)
MCV: 98.5 fL (ref 80.0–100.0)
Platelets: 428 10*3/uL — ABNORMAL HIGH (ref 150–400)
RBC: 3.94 MIL/uL (ref 3.87–5.11)
RDW: 12.3 % (ref 11.5–15.5)
WBC: 7.6 10*3/uL (ref 4.0–10.5)
nRBC: 0 % (ref 0.0–0.2)

## 2018-01-31 LAB — COMPREHENSIVE METABOLIC PANEL
ALBUMIN: 4.5 g/dL (ref 3.5–5.0)
ALT: 25 U/L (ref 0–44)
ANION GAP: 10 (ref 5–15)
AST: 29 U/L (ref 15–41)
Alkaline Phosphatase: 91 U/L (ref 38–126)
BILIRUBIN TOTAL: 0.5 mg/dL (ref 0.3–1.2)
BUN: 14 mg/dL (ref 8–23)
CO2: 26 mmol/L (ref 22–32)
Calcium: 9.1 mg/dL (ref 8.9–10.3)
Chloride: 99 mmol/L (ref 98–111)
Creatinine, Ser: 0.95 mg/dL (ref 0.44–1.00)
GFR calc Af Amer: >60 mL/min (ref >60)
GFR calc non Af Amer: >60 mL/min (ref >60)
Glucose, Bld: 107 mg/dL — ABNORMAL HIGH (ref 70–99)
POTASSIUM: 4.1 mmol/L (ref 3.5–5.1)
Sodium: 135 mmol/L (ref 135–145)
TOTAL PROTEIN: 7.2 g/dL (ref 6.5–8.1)

## 2018-01-31 LAB — LIPASE, BLOOD: Lipase: 37 U/L (ref 11–51)

## 2018-01-31 LAB — TROPONIN I

## 2018-01-31 MED ORDER — ALUM & MAG HYDROXIDE-SIMETH 200-200-20 MG/5ML PO SUSP
30.0000 mL | Freq: Once | ORAL | Status: AC
  Administered 2018-01-31: 30 mL via ORAL
  Filled 2018-01-31: qty 30

## 2018-01-31 MED ORDER — SODIUM CHLORIDE 0.9 % IV BOLUS
1000.0000 mL | Freq: Once | INTRAVENOUS | Status: AC
  Administered 2018-01-31: 1000 mL via INTRAVENOUS

## 2018-01-31 MED ORDER — LIDOCAINE VISCOUS HCL 2 % MT SOLN
15.0000 mL | Freq: Once | OROMUCOSAL | Status: AC
  Administered 2018-01-31: 15 mL via OROMUCOSAL
  Filled 2018-01-31: qty 15

## 2018-01-31 MED ORDER — SODIUM CHLORIDE 0.9% FLUSH: 3.0000 mL | Freq: Once | INTRAVENOUS | Status: DC

## 2018-01-31 NOTE — ED Provider Notes (Signed)
[facility] Emergency Department Provider Note  ____________________________________________   First MD Initiated Contact with Patient 01/31/18 1239     (approximate)  I have reviewed the triage vital signs and the nursing notes.   HISTORY  Chief Complaint Abdominal Pain   HPI Evelyn Simon is a 63 y.o. female with a history of GERD, hypertension and hypothyroidism was presented emergency department today complaining of left upper quadrant abdominal pain.  Patient says the pain is an 8 out of 10 in a crampy sharp pain.  Says it radiates through to her back.  No nausea or vomiting.  No diarrhea.  Says the pain is been intermittent since last night.  Says that she used to be on a PPI but it center into "kidney failure."  States that she has not had any trigger foods to set off her reflux today but that sometimes her reflux can be exacerbated without a trigger.  Also with a history of diverticulitis but says this feels different in quality.  Denies any burning with urination or hematuria.  Denies any fever and chills.   Past Medical History:  Diagnosis Date  . Arthritis   . Asthma   . Diverticulosis of colon   . GERD (gastroesophageal reflux disease)   . Headache    H/O MIGRAINES  . Hypertension   . Hypothyroidism     Patient Active Problem List   Diagnosis Date Noted  . Carotid stenosis 10/07/2017  . Mastalgia 11/15/2016  . Weight gain 11/15/2016  . Diverticulitis 10/29/2016  . Healthcare maintenance 08/15/2016  . Acquired hypothyroidism 06/20/2016  . Trigger finger of left thumb 06/17/2016  . Coronary artery disease involving native coronary artery of native heart without angina pectoris 05/21/2016  . Benign essential hypertension 04/19/2016  . Heart palpitations 04/19/2016  . Dyspepsia   . Diarrhea   . Diverticulosis of large intestine without diverticulitis   . Dupuytren's contracture 11/16/2015  . Arthritis of hand 07/25/2015  . 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 Depressive disorder 08/23/2013  . Sprain and strain of cruciate ligament of knee 08/23/2013  . Symptomatic menopausal or female climacteric states 07/22/2013  . Asthma 07/12/2013  . Diverticulosis of colon 07/12/2013  . Generalized osteoarthritis of multiple sites 07/12/2013  . Other and unspecified hyperlipidemia 07/12/2013    Past Surgical History:  Procedure Laterality Date  . CESAREAN SECTION     X3  . COLONOSCOPY WITH PROPOFOL N/A 12/21/2015   Procedure: COLONOSCOPY WITH PROPOFOL;  Surgeon: Wyline Mood, MD;  Location: [facility] ENDOSCOPY;  Service: Endoscopy;  Laterality: N/A;  . COLONOSCOPY WITH PROPOFOL N/A 12/16/2016   Procedure: COLONOSCOPY WITH PROPOFOL;  Surgeon: Wyline Mood, MD;  Location: [facility] ENDOSCOPY;  Service: Gastroenterology;  Laterality: N/A;  . DIAGNOSTIC LAPAROSCOPY    . DILATION AND CURETTAGE OF UTERUS    . ESOPHAGOGASTRODUODENOSCOPY (EGD) WITH PROPOFOL N/A 12/21/2015   Procedure: ESOPHAGOGASTRODUODENOSCOPY (EGD) WITH PROPOFOL;  Surgeon: Wyline Mood, MD;  Location: [facility] ENDOSCOPY;  Service: Endoscopy;  Laterality: N/A;  . ESOPHAGOGASTRODUODENOSCOPY (EGD) WITH PROPOFOL N/A 01/02/2016   Procedure: ESOPHAGOGASTRODUODENOSCOPY (EGD) WITH PROPOFOL;  Surgeon: Midge Minium, MD;  Location: [facility] ENDOSCOPY;  Service: Endoscopy;  Laterality: N/A;  . EXCISION MORTON'S NEUROMA Right   . FINGER ARTHROPLASTY Left 08/21/2015   Procedure: LEFT THUMB  ARTHROPLASTY (CMC);  Surgeon: Deeann Saint, MD;  Location: [facility] ORS;  Service: Orthopedics;  Laterality: Left;  . morton neuromas removal right foot    . TUBAL LIGATION    . TUMMY TUCK  2014  Prior to Admission medications   Medication Sig Start Date End Date Taking? Authorizing Provider  5-HYDROXYTRYPTOPHAN PO Take 1 tablet by mouth daily.    [provider]  azelastine (ASTELIN) 0.1 % nasal spray Place 1 spray into both nostrils 2 (two) times daily. Use in each nostril as directed    [provider]  BIOTIN PO Take 1 tablet by mouth daily.    [provider]  BLACK COHOSH PO Take 1 tablet by mouth daily.    [provider]  cefUROXime (CEFTIN) 500 MG tablet take 1 tablet by mouth twice a day for 7 days 10/21/16   [provider]  docusate sodium (COLACE) 100 MG capsule Take 1 capsule (100 mg total) by mouth 2 (two) times daily. 09/23/17 09/23/18  Minna AntisPaduchowski, Kevin, MD  gabapentin (NEURONTIN) 400 MG capsule Take 1 capsule (400 mg total) by mouth 3 (three) times daily. Patient taking differently: Take 400 mg by mouth as needed.  08/21/15   Deeann SaintMiller, Howard, MD  HYDROcodone-acetaminophen (NORCO/VICODIN) 5-325 MG tablet Take 1 tablet by mouth every 4 (four) hours as needed for moderate pain. 09/23/17 09/23/18  Minna AntisPaduchowski, Kevin, MD  levocetirizine (XYZAL) 5 MG tablet Take 5 mg by mouth every evening.    [provider]  Milk Thistle 1000 MG CAPS Take 1 capsule by mouth 1 day or 1 dose.    [provider]  Misc Natural Products (PUMPKIN SEED OIL) CAPS Take 1 capsule by mouth daily.    [provider]  omeprazole (PRILOSEC) 10 MG capsule Take 1 capsule by mouth 1 day or 1 dose.    [provider]  ondansetron (ZOFRAN) 4 MG tablet Take 1 tablet (4 mg total) by mouth every 8 (eight) hours as needed for nausea or vomiting. Patient not taking: Reported on 10/07/2017 09/20/17   Jeanmarie PlantMcShane, James A, MD  oxyCODONE-acetaminophen (PERCOCET) 5-325 MG tablet Take 1 tablet by mouth every 4 (four) hours as needed for severe pain. Patient not taking: Reported on 10/07/2017 09/20/17   Jeanmarie PlantMcShane, James A, MD  promethazine (PHENERGAN) 25 MG tablet Take 1 tablet (25 mg total) by mouth every 6 (six) hours as needed for nausea or vomiting. 09/23/17   Minna AntisPaduchowski, Kevin, MD  VENTOLIN HFA 108 [id number](90 Base) MCG/ACT inhaler inhale 2 puffs by mouth PRN 07/26/15   [provider]    Allergies Latex; Ergotamine-caffeine; Other; Pineapple; Soy allergy; Crestor  [rosuvastatin]; and Ultram [tramadol]  Family History  Problem Relation Age of Onset  . Breast cancer Maternal Aunt   . Stroke Mother   . Heart disease Mother   . Cancer Brother        LUNG  . Heart disease Father     Social History Social History   Tobacco Use  . Smoking status: Former Smoker    Types: Cigarettes  . Smokeless tobacco: Never Used  Substance Use Topics  . Alcohol use: Yes    Alcohol/week: 2.0 standard drinks    Types: 2 Glasses of wine per week    Comment: RED WINE DAILY,none 24hrs  . Drug use: No    Review of Systems  Constitutional: No fever/chills Eyes: No visual changes. ENT: No sore throat. Cardiovascular: Denies chest pain. Respiratory: Denies shortness of breath. Gastrointestinal:   No nausea, no vomiting.  No diarrhea.  No constipation. Genitourinary: Negative for dysuria. Musculoskeletal: As above Skin: Negative for rash. Neurological: Negative for headaches, focal weakness or numbness.   ____________________________________________   PHYSICAL EXAM:  VITAL SIGNS:  ED Triage Vitals  Enc Vitals Group     BP 01/31/18 1235 (!) 195/86     Pulse Rate 01/31/18 1235 90     Resp 01/31/18 1235 18     Temp 01/31/18 1235 97.6 F (36.4 C)     Temp src --      SpO2 01/31/18 1235 99 %     Weight 01/31/18 1236 168 lb (76.2 kg)     Height 01/31/18 1236 5\' 2"  (1.575 m)     Head Circumference --      Peak Flow --      Pain Score 01/31/18 1236 8     Pain Loc --      Pain Edu? --      Excl. in GC? --     Constitutional: Alert and oriented. Well appearing and in no acute distress. Eyes: Conjunctivae are normal.  Head: Atraumatic. Nose: No congestion/rhinnorhea. Mouth/Throat: Mucous membranes are moist.  Neck: No stridor.   Cardiovascular: Normal rate, regular rhythm. Grossly normal heart sounds.  Respiratory: Normal respiratory effort.  No retractions. Lungs CTAB. Gastrointestinal: Soft with moderate left upper quadrant as well as epigastric  tenderness to palpation without any right upper quadrant tenderness.  Negative Murphy sign.  No distention. No CVA tenderness. Musculoskeletal: No lower extremity tenderness nor edema.  No joint effusions. Neurologic:  Normal speech and language. No gross focal neurologic deficits are appreciated. Skin:  Skin is warm, dry and intact. No rash noted. Psychiatric: Mood and affect are normal. Speech and behavior are normal.  ____________________________________________   LABS (all labs ordered are listed, but only abnormal results are displayed)  Labs Reviewed  COMPREHENSIVE METABOLIC PANEL - Abnormal; Notable for the following components:      Result Value   Glucose, Bld 107 (*)    All other components within normal limits  CBC - Abnormal; Notable for the following components:   Platelets 428 (*)    All other components within normal limits  URINALYSIS, COMPLETE (UACMP) WITH MICROSCOPIC - Abnormal; Notable for the following components:   Color, Urine YELLOW (*)    APPearance CLEAR (*)    All other components within normal limits  LIPASE, BLOOD  TROPONIN I   ____________________________________________  EKG  ED ECG REPORT I, Arelia Longestavid M Alam Guterrez, the attending physician, personally viewed and interpreted this ECG.   Date: 01/31/2018  EKG Time: 1313  Rate: 75  Rhythm: normal sinus rhythm  Axis: Normal  Intervals:none  ST&T Change: No ST segment elevation or depression.  No abnormal T wave inversion.  ____________________________________________  RADIOLOGY   ____________________________________________   PROCEDURES  Procedure(s) performed:   Procedures  Critical Care performed:   ____________________________________________   INITIAL IMPRESSION / ASSESSMENT AND PLAN / ED COURSE  Pertinent labs & imaging results that were available during my care of the patient were reviewed by me and considered in my medical decision making (see chart for  details).  Differential diagnosis includes, but is not limited to, biliary disease (biliary colic, acute cholecystitis, cholangitis, choledocholithiasis, etc), intrathoracic causes for epigastric abdominal pain including ACS, gastritis, duodenitis, pancreatitis, small bowel or large bowel obstruction, abdominal aortic aneurysm, hernia, and ulcer(s). As part of my medical decision making, I reviewed the following data within the electronic MEDICAL RECORD NUMBER Notes from prior ED visits  ----------------------------------------- 2:42 PM on 01/31/2018 -----------------------------------------  Patient at this time feels much improved after GI cocktail.  Reexamined the abdomen and she is very minimally tender to the left upper as well  as epigastrium.  No tenderness across the lower abdomen.  Patient again states that this pain does not feel like her past episodes of diverticulitis.  Likely gastritis.  She says that she will use Pepto-Bismol at home.  Reassuring lab work-up.  Patient to be discharged at this time.  Patient understanding the diagnosis well treatment and willing to comply.  Will return for any worsening concerning symptoms. ____________________________________________   FINAL CLINICAL IMPRESSION(S) / ED DIAGNOSES  Gastritis.  Abdominal pain. NEW MEDICATIONS STARTED DURING THIS VISIT:  New Prescriptions   No medications on file     Note:  This document was prepared using Dragon voice recognition software and may include unintentional dictation errors.     Myrna Blazer, MD 01/31/18 1444

## 2018-01-31 NOTE — ED Notes (Signed)
Pt given urine cup and informed of need for urine sample.

## 2018-01-31 NOTE — ED Notes (Signed)
Pt c/o LUQ pain that began last night and comes and goes. Some nausea but no vomiting or diarrhea. A&O, ambulatory. No distress noted. Denies urinary symptoms. States still has gallbladder. Hx diverticulitis x 2, states last episode about 6 months ago.

## 2018-01-31 NOTE — ED Triage Notes (Signed)
abd pain and bloating since last night - intermitent.

## 2018-01-31 NOTE — ED Notes (Signed)
MD at bedside. 

## 2018-02-09 DIAGNOSIS — M1712 Unilateral primary osteoarthritis, left knee: Secondary | ICD-10-CM | POA: Insufficient documentation

## 2018-04-06 ENCOUNTER — Other Ambulatory Visit (INDEPENDENT_AMBULATORY_CARE_PROVIDER_SITE_OTHER): Payer: Self-pay | Admitting: Vascular Surgery

## 2018-04-06 DIAGNOSIS — R42 Dizziness and giddiness: Secondary | ICD-10-CM

## 2018-04-10 ENCOUNTER — Encounter (INDEPENDENT_AMBULATORY_CARE_PROVIDER_SITE_OTHER): Payer: BLUE CROSS/BLUE SHIELD

## 2018-04-10 ENCOUNTER — Ambulatory Visit (INDEPENDENT_AMBULATORY_CARE_PROVIDER_SITE_OTHER): Payer: BLUE CROSS/BLUE SHIELD | Admitting: Vascular Surgery

## 2018-09-17 ENCOUNTER — Emergency Department: Payer: BLUE CROSS/BLUE SHIELD

## 2018-09-17 ENCOUNTER — Other Ambulatory Visit: Payer: Self-pay

## 2018-09-17 ENCOUNTER — Encounter: Payer: Self-pay | Admitting: Emergency Medicine

## 2018-09-17 ENCOUNTER — Emergency Department
Admission: EM | Admit: 2018-09-17 | Discharge: 2018-09-17 | Disposition: A | Discharge status: Home / Self Care | Payer: BLUE CROSS/BLUE SHIELD | Attending: Student in an Organized Health Care Education/Training Program | Admitting: Student in an Organized Health Care Education/Training Program

## 2018-09-17 DIAGNOSIS — Y999 Unspecified external cause status: Secondary | ICD-10-CM | POA: Insufficient documentation

## 2018-09-17 DIAGNOSIS — S5011XA Contusion of right forearm, initial encounter: Secondary | ICD-10-CM | POA: Insufficient documentation

## 2018-09-17 DIAGNOSIS — I10 Essential (primary) hypertension: Secondary | ICD-10-CM | POA: Insufficient documentation

## 2018-09-17 DIAGNOSIS — Z79899 Other long term (current) drug therapy: Secondary | ICD-10-CM | POA: Diagnosis not present

## 2018-09-17 DIAGNOSIS — Y939 Activity, unspecified: Secondary | ICD-10-CM | POA: Diagnosis not present

## 2018-09-17 DIAGNOSIS — S76011A Strain of muscle, fascia and tendon of right hip, initial encounter: Secondary | ICD-10-CM | POA: Diagnosis not present

## 2018-09-17 DIAGNOSIS — E039 Hypothyroidism, unspecified: Secondary | ICD-10-CM | POA: Diagnosis not present

## 2018-09-17 DIAGNOSIS — Z91040 Latex allergy status: Secondary | ICD-10-CM | POA: Diagnosis not present

## 2018-09-17 DIAGNOSIS — W11XXXA Fall on and from ladder, initial encounter: Secondary | ICD-10-CM | POA: Diagnosis not present

## 2018-09-17 DIAGNOSIS — Z87891 Personal history of nicotine dependence: Secondary | ICD-10-CM | POA: Insufficient documentation

## 2018-09-17 DIAGNOSIS — Y929 Unspecified place or not applicable: Secondary | ICD-10-CM | POA: Diagnosis not present

## 2018-09-17 DIAGNOSIS — S76019A Strain of muscle, fascia and tendon of unspecified hip, initial encounter: Secondary | ICD-10-CM

## 2018-09-17 DIAGNOSIS — M25551 Pain in right hip: Secondary | ICD-10-CM | POA: Diagnosis present

## 2018-09-17 MED ORDER — HYDROCODONE-ACETAMINOPHEN 5-325 MG PO TABS: 1.0000 | ORAL_TABLET | Freq: Four times a day (QID) | ORAL | 0 refills | Status: AC | PRN

## 2018-09-17 MED ORDER — NAPROXEN 500 MG PO TABS: 500.0000 mg | ORAL_TABLET | Freq: Two times a day (BID) | ORAL | 0 refills | Status: DC

## 2018-09-17 MED ORDER — OXYCODONE HCL 5 MG PO TABS
5.0000 mg | ORAL_TABLET | Freq: Once | ORAL | Status: AC
  Administered 2018-09-17: 5 mg via ORAL
  Filled 2018-09-17: qty 1

## 2018-09-17 MED ORDER — TIZANIDINE HCL 4 MG PO TABS: 4.0000 mg | ORAL_TABLET | Freq: Three times a day (TID) | ORAL | 1 refills | Status: AC

## 2018-09-17 NOTE — ED Notes (Signed)
See triage note  Presents s/p fall  States she was painting a ceiling fell  Having pain to right lower back and hip area  And then also having pain to wrist

## 2018-09-17 NOTE — ED Provider Notes (Signed)
[facility] Emergency Department Provider Note ____________________________________________  Time seen: Approximately 3:04 PM  I have reviewed the triage vital signs and the nursing notes.   HISTORY  Chief Complaint Fall, Hip Pain, and Wrist Pain    HPI Evelyn Simon is a 63 y.o. female who presents to the emergency department for evaluation and treatment of right hip/pelvic pain and right forearm pain after mechanical, non-syncopal fall prior to arrival.  Patient states that she was standing on a counter and attempted to transition to a ladder.  The ladder slipped causing her to fall towards the right side.  Her hip and forearm hit the top of the counter.  Since that time, she has been unable to bear weight on the right side due to severe pain in the right hip/pelvis.  She states that her tailbone and lower back initially hurt, but has since started to feel a little bit better.  She did take a tramadol 15 mg tablet prior to arrival.  She reports a history of a crush injury to the pelvis at age 44.  Past Medical History:  Diagnosis Date  . Arthritis   . Asthma   . Diverticulosis of colon   . GERD (gastroesophageal reflux disease)   . Headache    H/O MIGRAINES  . Hypertension   . Hypothyroidism     Patient Active Problem List   Diagnosis Date Noted  . Carotid stenosis 10/07/2017  . Mastalgia 11/15/2016  . Weight gain 11/15/2016  . Diverticulitis 10/29/2016  . Healthcare maintenance 08/15/2016  . Acquired hypothyroidism 06/20/2016  . Trigger finger of left thumb 06/17/2016  . Coronary artery disease involving native coronary artery of native heart without angina pectoris 05/21/2016  . Benign essential hypertension 04/19/2016  . Heart palpitations 04/19/2016  . Dyspepsia   . Diarrhea   . Diverticulosis of large intestine without diverticulitis   . Dupuytren's contracture 11/16/2015  . Arthritis of hand 07/25/2015  . 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  Depressive disorder 08/23/2013  . Sprain and strain of cruciate ligament of knee 08/23/2013  . Symptomatic menopausal or female climacteric states 07/22/2013  . Asthma 07/12/2013  . Diverticulosis of colon 07/12/2013  . Generalized osteoarthritis of multiple sites 07/12/2013  . Other and unspecified hyperlipidemia 07/12/2013    Past Surgical History:  Procedure Laterality Date  . CESAREAN SECTION     X3  . COLONOSCOPY WITH PROPOFOL N/A 12/21/2015   Procedure: COLONOSCOPY WITH PROPOFOL;  Surgeon: Wyline Mood, MD;  Location: [facility] ENDOSCOPY;  Service: Endoscopy;  Laterality: N/A;  . COLONOSCOPY WITH PROPOFOL N/A 12/16/2016   Procedure: COLONOSCOPY WITH PROPOFOL;  Surgeon: Wyline Mood, MD;  Location: [facility] ENDOSCOPY;  Service: Gastroenterology;  Laterality: N/A;  . DIAGNOSTIC LAPAROSCOPY    . DILATION AND CURETTAGE OF UTERUS    . ESOPHAGOGASTRODUODENOSCOPY (EGD) WITH PROPOFOL N/A 12/21/2015   Procedure: ESOPHAGOGASTRODUODENOSCOPY (EGD) WITH PROPOFOL;  Surgeon: Wyline Mood, MD;  Location: [facility] ENDOSCOPY;  Service: Endoscopy;  Laterality: N/A;  . ESOPHAGOGASTRODUODENOSCOPY (EGD) WITH PROPOFOL N/A 01/02/2016   Procedure: ESOPHAGOGASTRODUODENOSCOPY (EGD) WITH PROPOFOL;  Surgeon: Midge Minium, MD;  Location: [facility] ENDOSCOPY;  Service: Endoscopy;  Laterality: N/A;  . EXCISION MORTON'S NEUROMA Right   . FINGER ARTHROPLASTY Left 08/21/2015   Procedure: LEFT THUMB  ARTHROPLASTY (CMC);  Surgeon: Deeann Saint, MD;  Location: [facility] ORS;  Service: Orthopedics;  Laterality: Left;  . morton neuromas removal right foot    . TUBAL LIGATION    . TUMMY TUCK  2014    Prior  to Admission medications   Medication Sig Start Date End Date Taking? Authorizing Provider  5-HYDROXYTRYPTOPHAN PO Take 1 tablet by mouth daily.    [provider]  azelastine (ASTELIN) 0.1 % nasal spray Place 1 spray into both nostrils 2 (two) times daily. Use in each nostril as directed    [provider]  BIOTIN PO Take 1  tablet by mouth daily.    [provider]  BLACK COHOSH PO Take 1 tablet by mouth daily.    [provider]  docusate sodium (COLACE) 100 MG capsule Take 1 capsule (100 mg total) by mouth 2 (two) times daily. 09/23/17 09/23/18  Harvest Dark, MD  gabapentin (NEURONTIN) 400 MG capsule Take 1 capsule (400 mg total) by mouth 3 (three) times daily. Patient taking differently: Take 400 mg by mouth as needed.  08/21/15   Earnestine Leys, MD  HYDROcodone-acetaminophen (NORCO/VICODIN) 5-325 MG tablet Take 1 tablet by mouth every 6 (six) hours as needed for up to 3 days for severe pain. 09/17/18 09/20/18  Huyen Perazzo, Johnette Abraham B, FNP  levocetirizine (XYZAL) 5 MG tablet Take 5 mg by mouth every evening.    [provider]  Milk Thistle 1000 MG CAPS Take 1 capsule by mouth 1 day or 1 dose.    [provider]  Misc Natural Products (PUMPKIN SEED OIL) CAPS Take 1 capsule by mouth daily.    [provider]  naproxen (NAPROSYN) 500 MG tablet Take 1 tablet (500 mg total) by mouth 2 (two) times daily with a meal. 09/17/18   Kyng Matlock B, FNP  omeprazole (PRILOSEC) 10 MG capsule Take 1 capsule by mouth 1 day or 1 dose.    [provider]  promethazine (PHENERGAN) 25 MG tablet Take 1 tablet (25 mg total) by mouth every 6 (six) hours as needed for nausea or vomiting. 09/23/17   Harvest Dark, MD  tiZANidine (ZANAFLEX) 4 MG tablet Take 1 tablet (4 mg total) by mouth 3 (three) times daily. 09/17/18 09/17/19  Victorino Dike, FNP  VENTOLIN HFA 108 (90 Base) MCG/ACT inhaler inhale 2 puffs by mouth PRN 07/26/15   [provider]    Allergies Latex, Ergotamine-caffeine, Other, Pineapple, Soy allergy, Crestor [rosuvastatin], and Ultram [tramadol]  Family History  Problem Relation Age of Onset  . Breast cancer Maternal Aunt   . Stroke Mother   . Heart disease Mother   . Cancer Brother        LUNG  . Heart disease Father     Social History Social History    Tobacco Use  . Smoking status: Former Smoker    Types: Cigarettes  . Smokeless tobacco: Never Used  Substance Use Topics  . Alcohol use: Yes    Alcohol/week: 2.0 standard drinks    Types: 2 Glasses of wine per week    Comment: RED WINE DAILY,none 24hrs  . Drug use: No    Review of Systems Constitutional: Negative for fever. Cardiovascular: Negative for chest pain. Respiratory: Negative for shortness of breath. Musculoskeletal: Positive for right hip and right forearm pain. Skin: Negative for open wound or lesion. Neurological: Negative for decrease in sensation  ____________________________________________   PHYSICAL EXAM:  VITAL SIGNS: ED Triage Vitals  Enc Vitals Group     BP 09/17/18 1335 (!) 181/97     Pulse Rate 09/17/18 1335 82     Resp 09/17/18 1335 19     Temp 09/17/18 1335 98.7 F (37.1 C)     Temp Source 09/17/18 1335  Oral     SpO2 09/17/18 1335 98 %     Weight --      Height --      Head Circumference --      Peak Flow --      Pain Score 09/17/18 1330 8     Pain Loc --      Pain Edu? --      Excl. in GC? --     Constitutional: Alert and oriented. Well appearing and in no acute distress. Eyes: Conjunctivae are clear without discharge or drainage Head: Atraumatic Neck: Supple.  No focal midline tenderness. Respiratory: No cough. Respirations are even and unlabored. Musculoskeletal: No focal tenderness along the length of the spine.  Tenderness over the proximal hip/femur.  Unable to flex at the knee secondary to pain in the right hip.  Focal tenderness over the ulnar aspect of the distal right forearm. Neurologic: Awake, alert, oriented x4. Skin: Contusion over the distal right forearm overlying area of pain Psychiatric: Affect and behavior are appropriate.  ____________________________________________   LABS (all labs ordered are listed, but only abnormal results are displayed)  Labs Reviewed - No data to  display ____________________________________________  RADIOLOGY  X-ray of the right hip is negative for acute findings per radiology.  X-ray of the right forearm is negative for acute findings per radiology.  CT of the right hip is negative for acute findings per radiology. ____________________________________________   PROCEDURES  Procedures  ____________________________________________   INITIAL IMPRESSION / ASSESSMENT AND PLAN / ED COURSE  Evelyn Simon is a 63 y.o. who presents to the emergency department for treatment and evaluation after mechanical, non-syncopal fall at home prior to arrival.  X-ray of the right hip and forearm are negative for acute abnormality.  Patient has significant pain with internal rotation of the right hip.  Will proceed with CT of the hip to ensure that there is no fracture.  This was discussed with the patient.  She agrees to the plan.  Roxicodone was ordered and administered.  Patient states that she is feeling some better.  CT of the right hip is negative.  Patient was advised of these results.  She will be discharged home with Zanaflex, Naprosyn, and a short course of Norco.  She was encouraged to see her primary care provider if she is not improving over the week.  She was also advised to rest, and apply ice off-and-on over the next few days.  If she has symptoms of concern and is unable to see primary care, she is to return to the emergency department.  Medications  oxyCODONE (Oxy IR/ROXICODONE) immediate release tablet 5 mg (5 mg Oral Given 09/17/18 1542)    Pertinent labs & imaging results that were available during my care of the patient were reviewed by me and considered in my medical decision making (see chart for details).  _________________________________________   FINAL CLINICAL IMPRESSION(S) / ED DIAGNOSES  Final diagnoses:  Contusion of right forearm, initial encounter  Hip strain, initial encounter    ED Discharge Orders          Ordered    tiZANidine (ZANAFLEX) 4 MG tablet  3 times daily     09/17/18 1634    HYDROcodone-acetaminophen (NORCO/VICODIN) 5-325 MG tablet  Every 6 hours PRN     09/17/18 1634    naproxen (NAPROSYN) 500 MG tablet  2 times daily with meals     09/17/18 1634  If controlled substance prescribed during this visit, 12 month history viewed on the [facility] prior to issuing an initial prescription for Schedule II or III opiod.   Chinita Pesterriplett, Alencia Gordon B, FNP 09/17/18 1637    Willy Eddyobinson, Patrick, MD 09/17/18 Wynetta [email or web address]

## 2018-09-17 NOTE — ED Triage Notes (Signed)
Fell off ladder and hit counter with her hip and then hit her wrist on it too.,  No loc

## 2018-09-17 NOTE — Discharge Instructions (Signed)
Please follow-up with your primary care provider if you are not feeling better in about a week.  Return to the emergency department for any symptom of concern if you are unable to see primary care.

## 2018-09-21 IMAGING — CT CT ABD-PELV W/ CM
2 of 5 series · 16 of 46 positions shown, 18 images · IV contrast (APPLIED)
Comparison: Radiographs dated 09/23/2017 and CT scans dated
09/20/2017, 10/18/2016 and 09/01/2015

CLINICAL DATA: Persistent severe abdominal pain. Diagnosis of
diverticulitis 1 week ago.

EXAM:
CT ABDOMEN AND PELVIS WITH CONTRAST
TECHNIQUE: Multidetector CT imaging of the abdomen and pelvis was performed
using the standard protocol following bolus administration of
intravenous contrast.
CONTRAST:  100mL V7XL1D-[id number] IOPAMIDOL (V7XL1D-BUU) INJECTION 61%

[Series 2: axial st · axial · 0.69mm/px · z∈[-957,-522]mm · 13 of 99 slices shown, 15 images]
[im 6/99  soft-tissue]
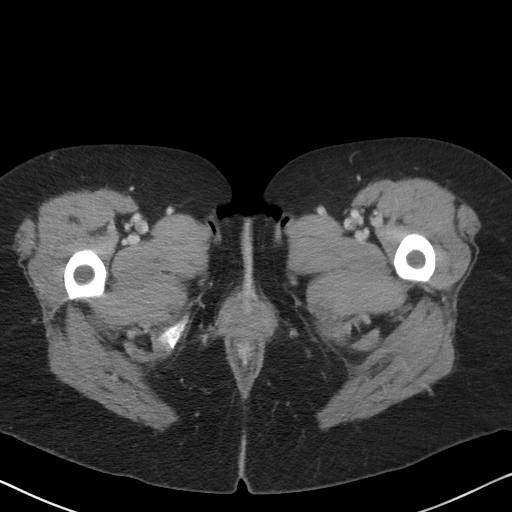
[im 6/99  bone]
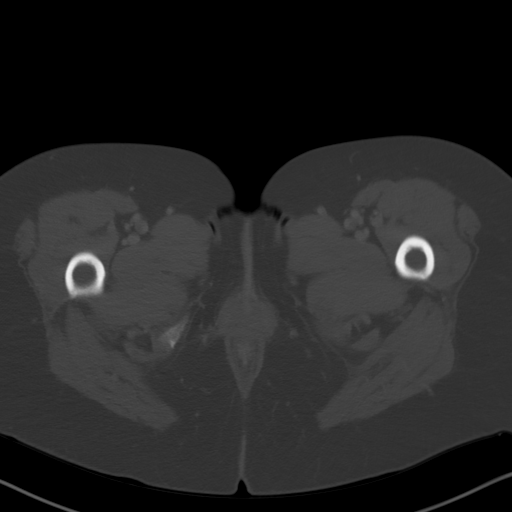
[im 12/99  soft-tissue]
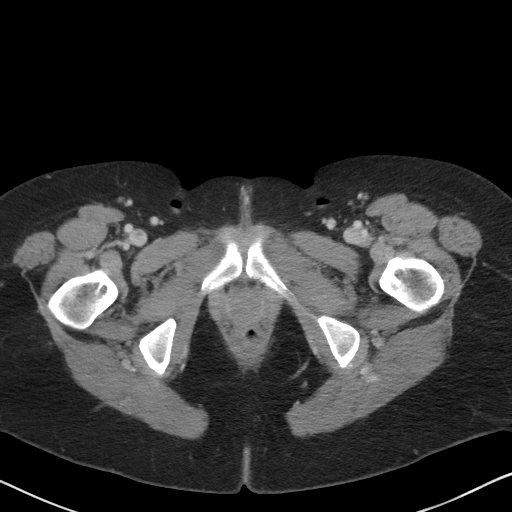
[im 24/99  soft-tissue]
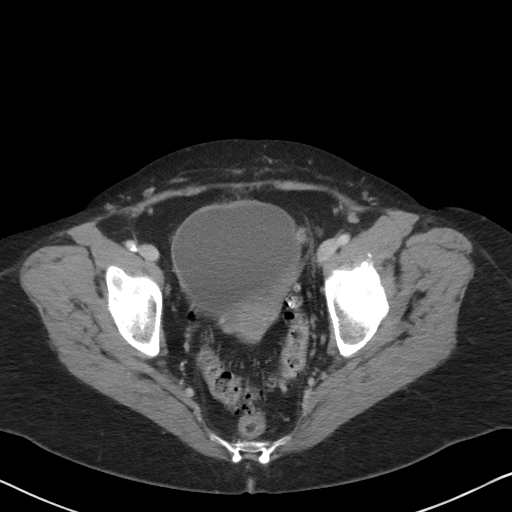
[im 29/99  soft-tissue]
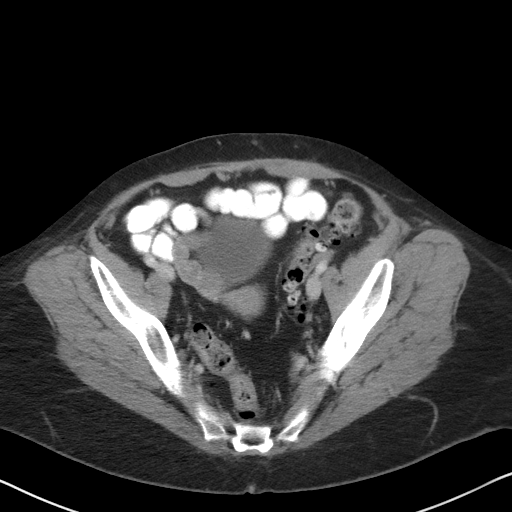
[im 35/99  soft-tissue]
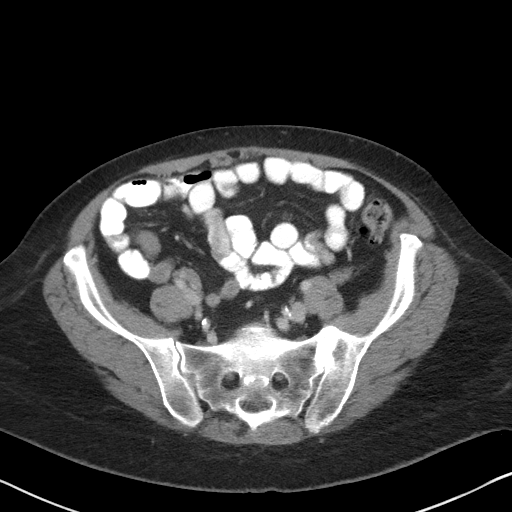
[im 41/99  soft-tissue]
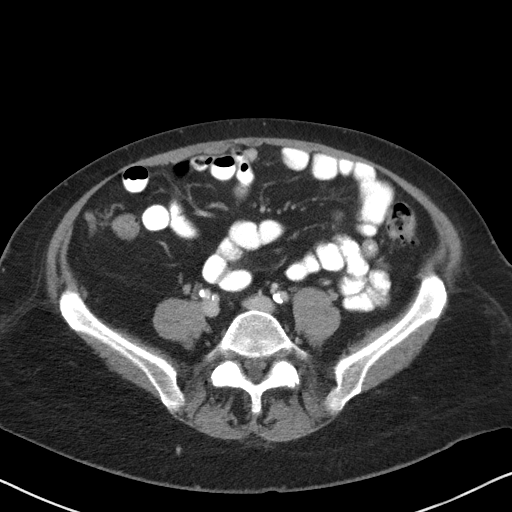
[im 52/99  soft-tissue]
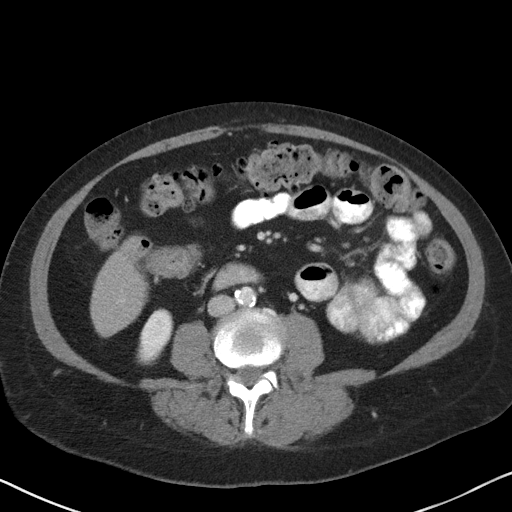
[im 58/99  soft-tissue]
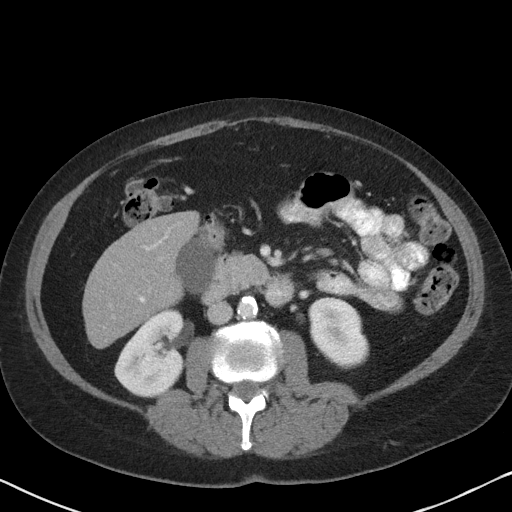
[im 64/99  soft-tissue]
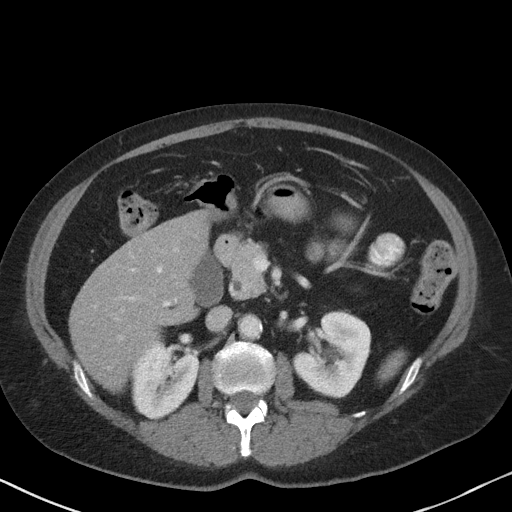
[im 64/99  bone]
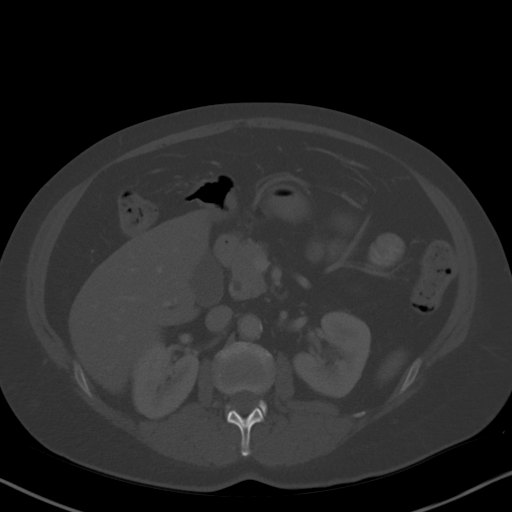
[im 70/99  soft-tissue]
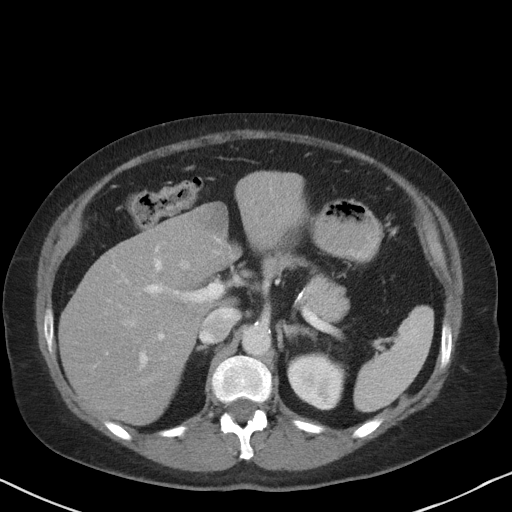
[im 75/99  soft-tissue]
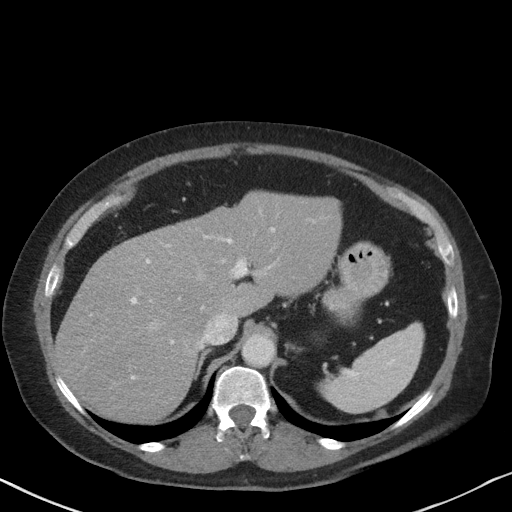
[im 87/99  soft-tissue]
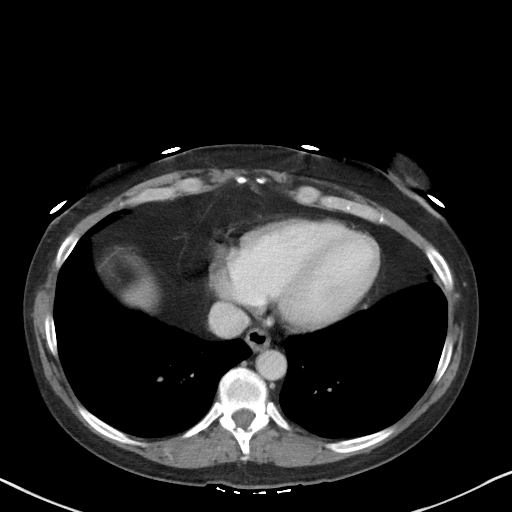
[im 93/99  soft-tissue]
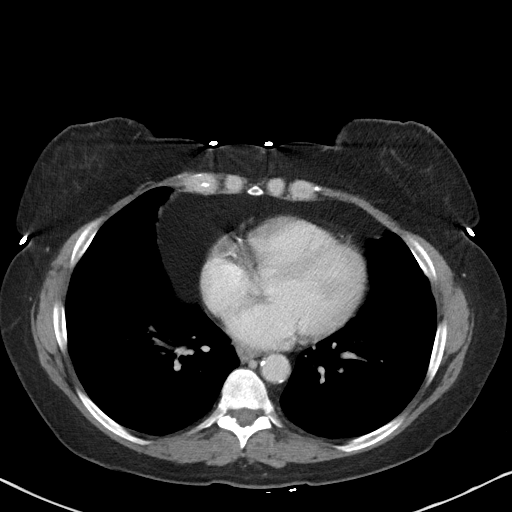

[Series 5: coronal st · coronal · 0.74mm/px · 3 of 90 slices shown]
[im 30/90  soft-tissue]
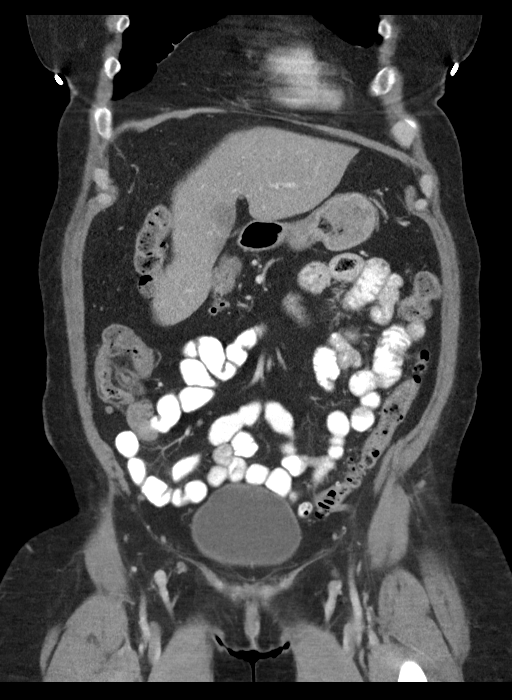
[im 40/90  soft-tissue]
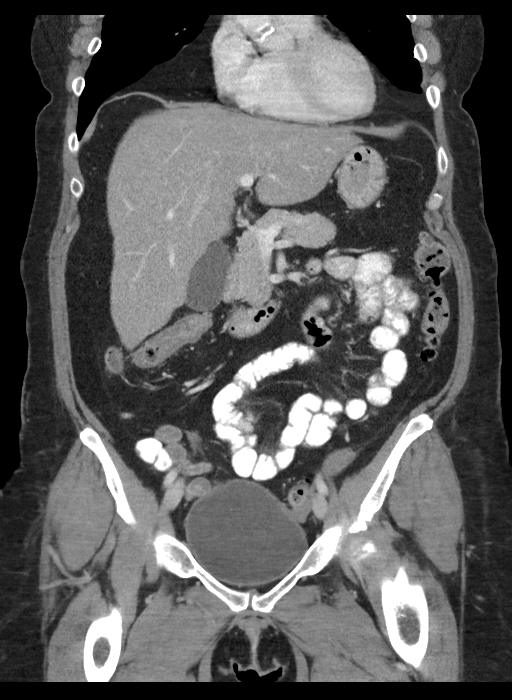
[im 50/90  soft-tissue]
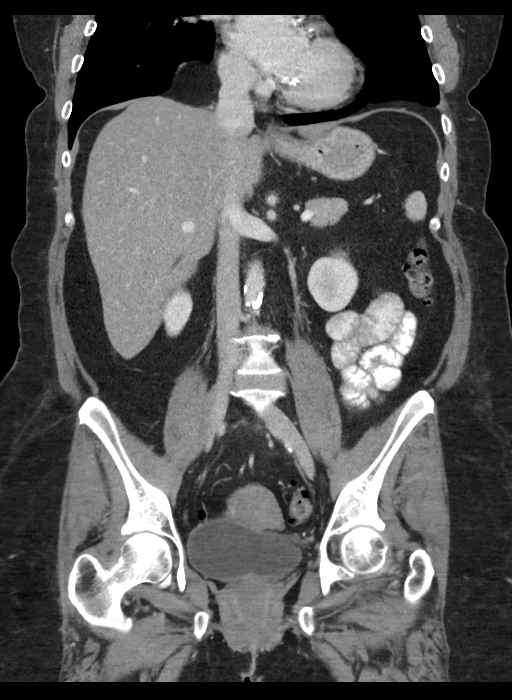

[16 of 46 positions shown; findings below may reference images not displayed]

FINDINGS: Lower chest: There is a stable 4.2 mm nodule in the lateral aspect
of the left lower lobe on image 12 of series 4, unchanged since 5056
and slightly smaller than on the study of 4087. Heart size is
normal. No pericardial effusion. Aortic atherosclerosis and
scattered coronary artery calcifications.

Hepatobiliary: No acute abnormality. Small focal area of fatty
infiltration in the left lobe adjacent to the falciform ligament.
Tiny cyst in liver parenchyma adjacent to the normal appearing
gallbladder. Biliary tree is normal.

Pancreas: Unremarkable. No pancreatic ductal dilatation or
surrounding inflammatory changes.

Spleen: Normal in size without focal abnormality.

Adrenals/Urinary Tract: Adrenal glands are unremarkable. Kidneys are
normal, without renal calculi, focal lesion, or hydronephrosis.
Bladder is unremarkable.

Stomach/Bowel: Diverticulosis of the of the entire colon without
evidence of acute diverticulitis. Stomach and small bowel appear
normal including the terminal ileum. Appendix is normal.

Vascular/Lymphatic: Aortic atherosclerosis. No enlarged abdominal or
pelvic lymph nodes.

Reproductive: Uterus and bilateral adnexa are unremarkable.

Other: No abdominal wall hernia or abnormality. No abdominopelvic
ascites.

Musculoskeletal: No acute or significant osseous findings.
IMPRESSION: No acute abnormalities. Extensive diverticulosis throughout the
colon without evidence of acute diverticulitis.

Extensive aortic atherosclerosis.

## 2019-11-30 DIAGNOSIS — G5782 Other specified mononeuropathies of left lower limb: Secondary | ICD-10-CM | POA: Insufficient documentation

## 2020-01-05 ENCOUNTER — Emergency Department (HOSPITAL_COMMUNITY): Payer: BLUE CROSS/BLUE SHIELD

## 2020-01-05 ENCOUNTER — Emergency Department (HOSPITAL_COMMUNITY)
Admission: EM | Admit: 2020-01-05 | Discharge: 2020-01-05 | Disposition: A | Discharge status: Home / Self Care | Payer: BLUE CROSS/BLUE SHIELD | Attending: Emergency Medicine | Admitting: Emergency Medicine

## 2020-01-05 ENCOUNTER — Encounter (HOSPITAL_COMMUNITY): Payer: Self-pay | Admitting: Emergency Medicine

## 2020-01-05 DIAGNOSIS — M79604 Pain in right leg: Secondary | ICD-10-CM | POA: Insufficient documentation

## 2020-01-05 DIAGNOSIS — Z91040 Latex allergy status: Secondary | ICD-10-CM | POA: Diagnosis not present

## 2020-01-05 DIAGNOSIS — E039 Hypothyroidism, unspecified: Secondary | ICD-10-CM | POA: Insufficient documentation

## 2020-01-05 DIAGNOSIS — I2510 Atherosclerotic heart disease of native coronary artery without angina pectoris: Secondary | ICD-10-CM | POA: Insufficient documentation

## 2020-01-05 DIAGNOSIS — J45909 Unspecified asthma, uncomplicated: Secondary | ICD-10-CM | POA: Diagnosis not present

## 2020-01-05 DIAGNOSIS — Z7982 Long term (current) use of aspirin: Secondary | ICD-10-CM | POA: Insufficient documentation

## 2020-01-05 DIAGNOSIS — Z87891 Personal history of nicotine dependence: Secondary | ICD-10-CM | POA: Insufficient documentation

## 2020-01-05 DIAGNOSIS — I10 Essential (primary) hypertension: Secondary | ICD-10-CM | POA: Insufficient documentation

## 2020-01-05 DIAGNOSIS — M25552 Pain in left hip: Secondary | ICD-10-CM | POA: Diagnosis not present

## 2020-01-05 DIAGNOSIS — R0602 Shortness of breath: Secondary | ICD-10-CM | POA: Insufficient documentation

## 2020-01-05 DIAGNOSIS — M79605 Pain in left leg: Secondary | ICD-10-CM | POA: Insufficient documentation

## 2020-01-05 DIAGNOSIS — Z79899 Other long term (current) drug therapy: Secondary | ICD-10-CM | POA: Insufficient documentation

## 2020-01-05 LAB — COMPREHENSIVE METABOLIC PANEL
ALT: 34 U/L (ref 0–44)
AST: 36 U/L (ref 15–41)
Albumin: 4.4 g/dL (ref 3.5–5.0)
Alkaline Phosphatase: 76 U/L (ref 38–126)
Anion gap: 12 (ref 5–15)
BUN: 9 mg/dL (ref 8–23)
CO2: 26 mmol/L (ref 22–32)
Calcium: 9.4 mg/dL (ref 8.9–10.3)
Chloride: 92 mmol/L — ABNORMAL LOW (ref 98–111)
Creatinine, Ser: 0.81 mg/dL (ref 0.44–1.00)
GFR, Estimated: >60 mL/min (ref >60)
Glucose, Bld: 107 mg/dL — ABNORMAL HIGH (ref 70–99)
Potassium: 3.6 mmol/L (ref 3.5–5.1)
Sodium: 130 mmol/L — ABNORMAL LOW (ref 135–145)
Total Bilirubin: 0.9 mg/dL (ref 0.3–1.2)
Total Protein: 7 g/dL (ref 6.5–8.1)

## 2020-01-05 LAB — CBC WITH DIFFERENTIAL/PLATELET
Abs Immature Granulocytes: 0.04 10*3/uL (ref 0.00–0.07)
Basophils Absolute: 0.1 10*3/uL (ref 0.0–0.1)
Basophils Relative: 1 %
Eosinophils Absolute: 0.2 10*3/uL (ref 0.0–0.5)
Eosinophils Relative: 2 %
HCT: 42.5 % (ref 36.0–46.0)
Hemoglobin: 14.1 g/dL (ref 12.0–15.0)
Immature Granulocytes: 0 %
Lymphocytes Relative: 28 %
Lymphs Abs: 2.6 10*3/uL (ref 0.7–4.0)
MCH: 32 pg (ref 26.0–34.0)
MCHC: 33.2 g/dL (ref 30.0–36.0)
MCV: 96.6 fL (ref 80.0–100.0)
Monocytes Absolute: 0.7 10*3/uL (ref 0.1–1.0)
Monocytes Relative: 8 %
Neutro Abs: 5.8 10*3/uL (ref 1.7–7.7)
Neutrophils Relative %: 61 %
Platelets: 500 10*3/uL — ABNORMAL HIGH (ref 150–400)
RBC: 4.4 MIL/uL (ref 3.87–5.11)
RDW: 12.1 % (ref 11.5–15.5)
WBC: 9.4 10*3/uL (ref 4.0–10.5)
nRBC: 0 % (ref 0.0–0.2)

## 2020-01-05 MED ORDER — IOHEXOL 350 MG/ML SOLN
100.0000 mL | Freq: Once | INTRAVENOUS | Status: AC | PRN
  Administered 2020-01-05: 100 mL via INTRAVENOUS

## 2020-01-05 NOTE — ED Notes (Signed)
Patient Alert and oriented to baseline. Stable and ambulatory to baseline. Patient verbalized understanding of the discharge instructions.  Patient belongings were taken by the patient.   

## 2020-01-05 NOTE — ED Provider Notes (Signed)
[facility]   CSN: [id number] Arrival date & time: 01/05/20  [id number]     History Chief Complaint  Patient presents with  . Circulatory Problem    Evelyn RankinsRobin Simon is a 64 y.o. female.  HPI Patient is a 64 year old female with an extensive medical history as noted below.  She presents today due to worsening bilateral leg pain, left greater than right.  Pain seems to worsen with ambulation.  Patient was scheduled to have a neuroma removed on 12/29 but states that she had a vascular ultrasound on 12/14 which showed an occluded right iliac and because of this is not able to have this done.  Due to her worsening pain, she came to the emergency department for further evaluation.  Also complains of moderate left hip pain.  Worsens with movement and palpation.  Patient also complains of mild shortness of breath.  Notes a history of chronic shortness of breath but feels that it is mildly worse than normal for the last 1 to 2 weeks.  No leg swelling, recent travel or immobilization, oral estrogen use, smoking, hemoptysis, history of blood clots.  Denies chest pain, abdominal pain, nausea, vomiting, urinary changes.  Images from vascular ultrasound on 12/14:          Past Medical History:  Diagnosis Date  . Arthritis   . Asthma   . Diverticulosis of colon   . GERD (gastroesophageal reflux disease)   . Headache    H/O MIGRAINES  . Hypertension   . Hypothyroidism     Patient Active Problem List   Diagnosis Date Noted  . Carotid stenosis 10/07/2017  . Mastalgia 11/15/2016  . Weight gain 11/15/2016  . Diverticulitis 10/29/2016  . Healthcare maintenance 08/15/2016  . Acquired hypothyroidism 06/20/2016  . Trigger finger of left thumb 06/17/2016  . Coronary artery disease involving native coronary artery of native heart without angina pectoris 05/21/2016  . Benign essential hypertension 04/19/2016  . Heart palpitations 04/19/2016  .  Dyspepsia   . Diarrhea   . Diverticulosis of large intestine without diverticulitis   . Dupuytren's contracture 11/16/2015  . Arthritis of hand 07/25/2015  . 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 Depressive disorder 08/23/2013  . Sprain and strain of cruciate ligament of knee 08/23/2013  . Symptomatic menopausal or female climacteric states 07/22/2013  . Asthma 07/12/2013  . Diverticulosis of colon 07/12/2013  . Generalized osteoarthritis of multiple sites 07/12/2013  . Other and unspecified hyperlipidemia 07/12/2013    Past Surgical History:  Procedure Laterality Date  . CESAREAN SECTION     X3  . COLONOSCOPY WITH PROPOFOL N/A 12/21/2015   Procedure: COLONOSCOPY WITH PROPOFOL;  Surgeon: Wyline MoodKiran Anna, MD;  Location: [facility] ENDOSCOPY;  Service: Endoscopy;  Laterality: N/A;  . COLONOSCOPY WITH PROPOFOL N/A 12/16/2016   Procedure: COLONOSCOPY WITH PROPOFOL;  Surgeon: Wyline MoodAnna, Kiran, MD;  [facility];  Service: Gastroenterology;  Laterality: N/A;  . DIAGNOSTIC LAPAROSCOPY    . DILATION AND CURETTAGE OF UTERUS    . ESOPHAGOGASTRODUODENOSCOPY (EGD) WITH PROPOFOL N/A 12/21/2015   Procedure: ESOPHAGOGASTRODUODENOSCOPY (EGD) WITH PROPOFOL;  Surgeon: Wyline MoodKiran Anna, MD;  Location: [facility] ENDOSCOPY;  Service: Endoscopy;  Laterality: N/A;  . ESOPHAGOGASTRODUODENOSCOPY (EGD) WITH PROPOFOL N/A 01/02/2016   Procedure: ESOPHAGOGASTRODUODENOSCOPY (EGD) WITH PROPOFOL;  Surgeon: Midge Miniumarren Wohl, MD;  Location: [facility] ENDOSCOPY;  Service: Endoscopy;  Laterality: N/A;  . EXCISION MORTON'S NEUROMA Right   . FINGER ARTHROPLASTY Left 08/21/2015   Procedure: LEFT THUMB  ARTHROPLASTY (CMC);  Surgeon: Dimas AguasHoward  Hyacinth Meeker, MD;  Location: [facility] ORS;  Service: Orthopedics;  Laterality: Left;  . morton neuromas removal right foot    . TUBAL LIGATION    . TUMMY TUCK  2014     OB History   No obstetric history on file.     Family History  Problem Relation Age of Onset  . Breast cancer Maternal Aunt   . Stroke Mother   .  Heart disease Mother   . Cancer Brother        LUNG  . Heart disease Father     Social History   Tobacco Use  . Smoking status: Former Smoker    Types: Cigarettes  . Smokeless tobacco: Never Used  Vaping Use  . Vaping Use: Never used  Substance Use Topics  . Alcohol use: Yes    Alcohol/week: 2.0 standard drinks    Types: 2 Glasses of wine per week    Comment: RED WINE DAILY,none 24hrs  . Drug use: No    Home Medications Prior to Admission medications   Medication Sig Start Date End Date Taking? Authorizing Provider  acetaminophen (TYLENOL) 500 MG tablet Take 1,000 mg by mouth every 6 (six) hours as needed for moderate pain, mild pain or headache.   Yes [provider]  albuterol (VENTOLIN HFA) 108 (90 Base) MCG/ACT inhaler Inhale 1-2 puffs into the lungs every 6 (six) hours as needed for shortness of breath or wheezing. 12/10/17  Yes [provider]  Ascorbic Acid (VITAMIN C PO) Take 1 tablet by mouth daily.   Yes [provider]  aspirin 81 MG EC tablet Take 81 mg by mouth daily.   Yes [provider]  BIOTIN PO Take 1 tablet by mouth daily.   Yes [provider]  bismuth subsalicylate (PEPTO BISMOL) 262 MG chewable tablet Chew 524 mg by mouth as needed for indigestion.   Yes [provider]  cetirizine (ZYRTEC) 10 MG tablet Take 10 mg by mouth daily as needed for allergies.   Yes [provider]  fexofenadine (ALLEGRA) 180 MG tablet Take 180 mg by mouth daily as needed for allergies.   Yes [provider]  gabapentin (NEURONTIN) 300 MG capsule Take 300 mg by mouth daily as needed for pain. 08/18/19  Yes [provider]  lisinopril-hydrochlorothiazide (ZESTORETIC) 20-25 MG tablet Take 1 tablet by mouth every evening. 08/30/19  Yes [provider]  metoprolol succinate (TOPROL-XL) 25 MG 24 hr tablet Take 25 mg by mouth every evening. 08/30/19  Yes [provider]  Multiple Vitamin  (MULTI-VITAMIN) tablet Take 1 tablet by mouth daily.   Yes [provider]  OVER THE COUNTER MEDICATION Take 1 Dose by mouth daily. Intestinal Repair herbal supplement   Yes [provider]  Polyethyl Glycol-Propyl Glycol (SYSTANE) 0.4-0.3 % SOLN Place 1 drop into both eyes daily as needed (dry eyes).   Yes [provider]  Probiotic Product (PROBIOTIC PO) Take 1 Dose by mouth daily.   Yes [provider]  tetrahydrozoline-zinc (VISINE-AC) 0.05-0.25 % ophthalmic solution Place 2 drops into both eyes daily as needed (allergies).   Yes [provider]  traMADol (ULTRAM) 50 MG tablet Take 50 mg by mouth every 6 (six) hours as needed for pain. 09/03/19  Yes [provider]  VITAMIN D PO Take 1 tablet by mouth daily.   Yes [provider]  gabapentin (NEURONTIN) 400 MG capsule Take 1 capsule (400 mg total) by mouth 3 (three) times daily. Patient not taking:  Reported on 01/05/2020 08/21/15   Deeann Saint, MD  naproxen (NAPROSYN) 500 MG tablet Take 1 tablet (500 mg total) by mouth 2 (two) times daily with a meal. Patient not taking: No sig reported 09/17/18   Kem Boroughs B, FNP  promethazine (PHENERGAN) 25 MG tablet Take 1 tablet (25 mg total) by mouth every 6 (six) hours as needed for nausea or vomiting. Patient not taking: No sig reported 09/23/17   Minna Antis, MD    Allergies    Latex, Ergotamine-caffeine, Other, Pineapple, Soy allergy, and Codeine  Review of Systems   Review of Systems  All other systems reviewed and are negative. Ten systems reviewed and are negative for acute change, except as noted in the HPI.   Physical Exam Updated Vital Signs BP 115/76 (BP Location: Left Arm)   Pulse 82   Temp 98 F (36.7 C) (Oral)   Resp 15   Ht 5\' 2"  (1.575 m)   Wt 73.9 kg   SpO2 100%   BMI 29.81 kg/m   Physical Exam Vitals and nursing note reviewed.  Constitutional:      General: She is not in acute distress.     Appearance: Normal appearance. She is not ill-appearing, toxic-appearing or diaphoretic.  HENT:     Head: Normocephalic and atraumatic.     Right Ear: External ear normal.     Left Ear: External ear normal.     Nose: Nose normal.     Mouth/Throat:     Mouth: Mucous membranes are moist.     Pharynx: Oropharynx is clear. No oropharyngeal exudate or posterior oropharyngeal erythema.  Eyes:     General: No scleral icterus.       Right eye: No discharge.        Left eye: No discharge.     Extraocular Movements: Extraocular movements intact.     Conjunctiva/sclera: Conjunctivae normal.  Cardiovascular:     Rate and Rhythm: Normal rate and regular rhythm.     Pulses: Normal pulses.     Heart sounds: Normal heart sounds. No murmur heard. No friction rub. No gallop.      Comments: Regular rate and rhythm without murmurs, rubs, or gallops.  Dopplerable DP, PT, and popliteal pulses noted bilaterally. Pulmonary:     Effort: Pulmonary effort is normal. No respiratory distress.     Breath sounds: Normal breath sounds. No stridor. No wheezing, rhonchi or rales.     Comments: Lungs are clear to auscultation bilaterally.  No tachypnea.  Speaking in clear and complete sentences.  Oxygen saturations are 100% on room air. Abdominal:     General: Abdomen is flat.     Palpations: Abdomen is soft.     Tenderness: There is no abdominal tenderness.  Musculoskeletal:        General: Normal range of motion.     Cervical back: Normal range of motion and neck supple. No tenderness.     Comments: Mild TTP noted to the anterior and lateral left hip.  Full range of motion of the left hip.  Patient able to stand and ambulate unassisted with a steady gait.  Skin:    General: Skin is warm and dry.  Neurological:     General: No focal deficit present.     Mental Status: She is alert and oriented to person, place, and time.  Psychiatric:        Mood and Affect: Mood normal.        Behavior: Behavior normal.  ED Results / Procedures / Treatments   Labs (all labs ordered are listed, but only abnormal results are displayed) Labs Reviewed  COMPREHENSIVE METABOLIC PANEL - Abnormal; Notable for the following components:      Result Value   Sodium 130 (*)    Chloride 92 (*)    Glucose, Bld 107 (*)    All other components within normal limits  CBC WITH DIFFERENTIAL/PLATELET - Abnormal; Notable for the following components:   Platelets 500 (*)    All other components within normal limits    EKG None  Radiology DG Chest Portable 1 View  Result Date: 01/05/2020 CLINICAL DATA:  Shortness of breath. EXAM: PORTABLE CHEST 1 VIEW COMPARISON:  Chest and two views abdomen 09/23/2017. FINDINGS: Minimal linear atelectasis or scar in left lung base is unchanged. Lungs otherwise clear. Heart size normal. Prominent fat at the right cardiophrenic angle is stable in appearance. No pneumothorax or pleural fluid. IMPRESSION: No acute disease. Electronically Signed   By: Drusilla Kanner M.D.   On: 01/05/2020 10:07   Procedures Procedures (including critical care time)  Medications Ordered in ED Medications  iohexol (OMNIPAQUE) 350 MG/ML injection 100 mL (100 mLs Intravenous Contrast Given 01/05/20 1251)    ED Course  I have reviewed the triage vital signs and the nursing notes.  Pertinent labs & imaging results that were available during my care of the patient were reviewed by me and considered in my medical decision making (see chart for details).  Clinical Course as of 01/05/20 1419  Wed Jan 05, 2020  1011 DG Chest Portable 1 View No acute disease [LJ]  1044 Patient discussed with Dr. Arbie Cookey with vascular surgery.  Recommended CTA with runoff.  Reconsult after CTA results. [LJ]  1401 Dr. Arbie Cookey evaluated patient's CTA.  He also evaluated the patient at bedside.  Feels that her CTA is reassuring.  Does not provide explanation for patient's symptoms.  Feels that she is clear from a vascular  standpoint. [LJ]  1412 Sodium(!): 130 [LJ]  1413 Chloride(!): 92 [LJ]  1413 Platelets(!): 500 [LJ]    Clinical Course User Index [LJ] Placido Sou, PA-C   MDM Rules/Calculators/A&P                          Patient is a 64 year old female who presents to the emergency department with worsening bilateral leg pain, left greater than right.  Patient recently had a vascular ultrasound performed concerning for an occluded right iliac artery.  She states over the past 2 weeks her symptoms have continued to worsen.  She has not been evaluated by vascular surgery as of yet.  I was able to obtain dopplerable DP, PT, and popliteal pulses in the bilateral lower extremities.  Good cap refill.  No signs of acute ischemia.  Given her vascular ultrasound as well as her symptoms, patient was discussed with Dr. Arbie Cookey with vascular surgery.  Recommended CTA with runoff.  This was obtained.  Dr. Arbie Cookey evaluated the imaging as well as evaluated the patient at bedside.  He states that he feels her CT is reassuring.  Feels that patient is clear from a vascular standpoint.  Discussed in length with the patient.  She does have some moderate TTP in the left hip that she states has been ongoing as well and worsens with ambulation as well as climbing stairs.  Recommended continued use of her over-the-counter medications as well as her prescribed gabapentin and tramadol.  Unsure of  the cause of patient's pain.  Arthritic versus neuropathic versus idiopathic.  Patient has no leg swelling or calf pain on my exam.  Wells criteria for DVT/PE rule out is 0.  Vital signs within normal limits.  No tachycardia or hypoxia.  Normotensive.  Feel the patient is safe for discharge at this time.  She is agreeable.  Recommended returning to the ER with new or worsening symptoms.  PCP follow-up.  Her questions were answered and she was amicable at the time of discharge.  Final Clinical Impression(s) / ED Diagnoses Final diagnoses:   Pain in both lower extremities    Rx / DC Orders ED Discharge Orders    None       Placido Sou, PA-C 01/05/20 1423    Benjiman Core, MD 01/05/20 [id number]

## 2020-01-05 NOTE — ED Triage Notes (Signed)
Pt reports she was diagnosed with an occluded illiac artery 1 week ago, supposed to be seen by vascular but has not been able to get in touch with anyone to set up follow up. She reports that she has undetectable pulses in her L leg that has been ongoing. Having severe pain to R leg now as well as sob.

## 2020-01-05 NOTE — Consult Note (Signed)
[facility]ist  Patient name: Evelyn Simon MRN: [id number] DOB: 1955-10-04 Sex: female  REASON FOR CONSULT: Evaluation lower extremity discomfort and arterial insufficiency  HPI: Evelyn Simon is a 64 y.o. female, seen today in the emergency room for evaluation of lower extremity arterial insufficiency.  She has a very complicated story.  She has pain in her left leg from her groin through her knee and down into her calf.  This is been present for months with no clear etiology.  She also has known arterial insufficiency with recent noninvasive studies suggesting moderate right leg and no significant left leg disease.  She presented with worsening pain in her left leg and we were consulted.  She did undergo CT angiogram today for further evaluation.  She does report mild claudication symptoms in her right leg which is total leg.  This is discrete from her left leg symptoms which are constant.  She has no history of tissue loss.  No history of cardiac disease.  Past history of smoking  Past Medical History:  Diagnosis Date  . Arthritis   . Asthma   . Diverticulosis of colon   . GERD (gastroesophageal reflux disease)   . Headache    H/O MIGRAINES  . Hypertension   . Hypothyroidism     Family History  Problem Relation Age of Onset  . Breast cancer Maternal Aunt   . Stroke Mother   . Heart disease Mother   . Cancer Brother        LUNG  . Heart disease Father     SOCIAL HISTORY: Social History   Socioeconomic History  . Marital status: Divorced    Spouse name: Not on file  . Number of children: Not on file  . Years of education: Not on file  . Highest education level: Not on file  Occupational History  . Not on file  Tobacco Use  . Smoking status: Former Smoker    Types: Cigarettes  . Smokeless tobacco: Never Used  Vaping Use  . Vaping Use: Never used  Substance and Sexual Activity  . Alcohol use: Yes    Alcohol/week: 2.0 standard drinks    Types: 2  Glasses of wine per week    Comment: RED WINE DAILY,none 24hrs  . Drug use: No  . Sexual activity: Never  Other Topics Concern  . Not on file  Social History Narrative  . Not on file   Social Determinants of Health   Financial Resource Strain: Not on file  Food Insecurity: Not on file  Transportation Needs: Not on file  Physical Activity: Not on file  Stress: Not on file  Social Connections: Not on file  Intimate Partner Violence: Not on file    Allergies  Allergen Reactions  . Latex Anaphylaxis  . Ergotamine-Caffeine Hives    Body rash  . Other Hives and Itching     AVOCADO ALL DAIRY PRODUCTS AND AVOCADO  . Pineapple Other (See Comments)    MOUTH SORES  . Soy Allergy Hives  . Codeine Nausea And Vomiting    No current facility-administered medications for this encounter.   Current Outpatient Medications  Medication Sig Dispense Refill  . acetaminophen (TYLENOL) 500 MG tablet Take 1,000 mg by mouth every 6 (six) hours as needed for moderate pain, mild pain or headache.    . albuterol (VENTOLIN HFA) 108 (90 Base) MCG/ACT inhaler Inhale 1-2 puffs into the lungs every 6 (six) hours as needed for shortness of breath  or wheezing.    . Ascorbic Acid (VITAMIN C PO) Take 1 tablet by mouth daily.    Marland Kitchen aspirin 81 MG EC tablet Take 81 mg by mouth daily.    Marland Kitchen BIOTIN PO Take 1 tablet by mouth daily.    Marland Kitchen bismuth subsalicylate (PEPTO BISMOL) 262 MG chewable tablet Chew 524 mg by mouth as needed for indigestion.    . cetirizine (ZYRTEC) 10 MG tablet Take 10 mg by mouth daily as needed for allergies.    . fexofenadine (ALLEGRA) 180 MG tablet Take 180 mg by mouth daily as needed for allergies.    Marland Kitchen gabapentin (NEURONTIN) 300 MG capsule Take 300 mg by mouth daily as needed for pain.    Marland Kitchen lisinopril-hydrochlorothiazide (ZESTORETIC) 20-25 MG tablet Take 1 tablet by mouth every evening.    . metoprolol succinate (TOPROL-XL) 25 MG 24 hr tablet Take 25 mg by mouth every evening.    .  Multiple Vitamin (MULTI-VITAMIN) tablet Take 1 tablet by mouth daily.    Marland Kitchen OVER THE COUNTER MEDICATION Take 1 Dose by mouth daily. Intestinal Repair herbal supplement    . Polyethyl Glycol-Propyl Glycol (SYSTANE) 0.4-0.3 % SOLN Place 1 drop into both eyes daily as needed (dry eyes).    . Probiotic Product (PROBIOTIC PO) Take 1 Dose by mouth daily.    Marland Kitchen tetrahydrozoline-zinc (VISINE-AC) 0.05-0.25 % ophthalmic solution Place 2 drops into both eyes daily as needed (allergies).    . traMADol (ULTRAM) 50 MG tablet Take 50 mg by mouth every 6 (six) hours as needed for pain.    Marland Kitchen VITAMIN D PO Take 1 tablet by mouth daily.    Marland Kitchen gabapentin (NEURONTIN) 400 MG capsule Take 1 capsule (400 mg total) by mouth 3 (three) times daily. (Patient not taking: Reported on 01/05/2020) 60 capsule 3  . naproxen (NAPROSYN) 500 MG tablet Take 1 tablet (500 mg total) by mouth 2 (two) times daily with a meal. (Patient not taking: No sig reported) 30 tablet 0  . promethazine (PHENERGAN) 25 MG tablet Take 1 tablet (25 mg total) by mouth every 6 (six) hours as needed for nausea or vomiting. (Patient not taking: No sig reported) 20 tablet 0    REVIEW OF SYSTEMS:  Reviewed in the patient's chart with nothing to add  PHYSICAL EXAM: Vitals:   01/05/20 1100 01/05/20 1130 01/05/20 1200 01/05/20 1329  BP: (!) 130/99 135/73 (!) 142/57 115/76  Pulse: 72 66 72 82  Resp: 17 (!) 9 14 15   Temp:      TempSrc:      SpO2: 99% 99% 98% 100%  Weight:      Height:        GENERAL: The patient is a well-nourished female, in no acute distress. The vital signs are documented above. VASCULAR: 2+ radial pulses bilaterally.  Palpable femoral pulses bilaterally.  2+ left dorsalis pedis pulse and 1+ right dorsalis pedis pulse PULMONARY: There is good air exchange ABDOMEN: Soft and non-tender  MUSCULOSKELETAL: There are no major deformities or cyanosis. NEUROLOGIC: No focal weakness or paresthesias are detected. SKIN: There are no ulcers or  rashes noted. PSYCHIATRIC: The patient has a normal affect.  DATA:   CT angiogram abdomen pelvis and runoff revealed extensive calcification of aortoiliac segments.  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  Calcification but no stenosis in the left common iliac.  There is normal flow from the common femoral, superficial femoral, popliteal and tibial  vessels runoff bilaterally  MEDICAL ISSUES:  I discussed this with the patient.  She does not have any evidence of arterial insufficiency that would cause her resting symptoms on the left.  She does have intermittent claudication in the right leg related to her iliac disease.  This is not limiting to her.  She does have severe shortness of breath and reports that she cannot walk up a flight of stairs without walking for respiratory issues. I do not see any arterial issue that is limb threatening.  I recommended that she continue her walking program and she will continue evaluation for her left leg symptoms.  I would not repeat any vascular work-up unless she develops worsening symptoms.  We will see her again on an as-needed basis  Larina Earthly, MD [facility]  Office phone [phone]

## 2020-01-05 NOTE — ED Notes (Signed)
Patient transported to CT 

## 2020-01-05 NOTE — Discharge Instructions (Signed)
If you develop worsening symptoms, please return to the emergency department.  It was a pleasure to meet you.

## 2020-03-01 ENCOUNTER — Emergency Department: Payer: 59

## 2020-03-01 ENCOUNTER — Encounter: Payer: Self-pay | Admitting: Emergency Medicine

## 2020-03-01 ENCOUNTER — Emergency Department
Admission: EM | Admit: 2020-03-01 | Discharge: 2020-03-01 | Disposition: A | Discharge status: Home / Self Care | Payer: 59 | Attending: Emergency Medicine | Admitting: Emergency Medicine

## 2020-03-01 ENCOUNTER — Other Ambulatory Visit: Payer: Self-pay

## 2020-03-01 DIAGNOSIS — I2510 Atherosclerotic heart disease of native coronary artery without angina pectoris: Secondary | ICD-10-CM | POA: Diagnosis not present

## 2020-03-01 DIAGNOSIS — Z7982 Long term (current) use of aspirin: Secondary | ICD-10-CM | POA: Diagnosis not present

## 2020-03-01 DIAGNOSIS — I10 Essential (primary) hypertension: Secondary | ICD-10-CM | POA: Insufficient documentation

## 2020-03-01 DIAGNOSIS — Z91040 Latex allergy status: Secondary | ICD-10-CM | POA: Diagnosis not present

## 2020-03-01 DIAGNOSIS — Z87891 Personal history of nicotine dependence: Secondary | ICD-10-CM | POA: Diagnosis not present

## 2020-03-01 DIAGNOSIS — J45909 Unspecified asthma, uncomplicated: Secondary | ICD-10-CM | POA: Insufficient documentation

## 2020-03-01 DIAGNOSIS — Z79899 Other long term (current) drug therapy: Secondary | ICD-10-CM | POA: Diagnosis not present

## 2020-03-01 DIAGNOSIS — E039 Hypothyroidism, unspecified: Secondary | ICD-10-CM | POA: Diagnosis not present

## 2020-03-01 DIAGNOSIS — M79605 Pain in left leg: Secondary | ICD-10-CM | POA: Diagnosis not present

## 2020-03-01 DIAGNOSIS — Z96692 Finger-joint replacement of left hand: Secondary | ICD-10-CM | POA: Diagnosis not present

## 2020-03-01 MED ORDER — PREDNISONE 10 MG (21) PO TBPK: ORAL_TABLET | ORAL | 0 refills | Status: DC

## 2020-03-01 NOTE — Discharge Instructions (Addendum)
Please seek medical attention for any high fevers, chest pain, shortness of breath, change in behavior, persistent vomiting, bloody stool or any other new or concerning symptoms.  

## 2020-03-01 NOTE — ED Triage Notes (Signed)
Pt reports sent over from [facility] to rule out a clot in her left leg. Pt reports pain from left groin down to foot and numbness in her foot. Pt reports hx of partial occulusion to her femoral artery.

## 2020-03-01 NOTE — ED Notes (Signed)
This RN agrees with triage nurse assessment. Pt is ambulatory at this time with steady gait. D/C and new RX discussed with pt. NAD noted.

## 2020-03-01 NOTE — ED Provider Notes (Signed)
[facility] Emergency Department Provider Note   ____________________________________________   I have reviewed the triage vital signs and the nursing notes.   HISTORY  Chief Complaint Leg Pain   History limited by: Not Limited   HPI Evelyn Simon is a 65 y.o. female who presents to the emergency department today because of concern for left leg pain. The patient states that she has been having this pain for months. She says that it will come and go in "spasms" the pain will shoot from her groin down to her foot. She has been seen for this in the past. She says that she came in today because her left foot felt cold this morning. The patient denies any shortness of breath or chest pain.    Records reviewed. Per medical record review patient has a history of visit to ER in December   Past Medical History:  Diagnosis Date  . Arthritis   . Asthma   . Diverticulosis of colon   . GERD (gastroesophageal reflux disease)   . Headache    H/O MIGRAINES  . Hypertension   . Hypothyroidism     Patient Active Problem List   Diagnosis Date Noted  . Carotid stenosis 10/07/2017  . Mastalgia 11/15/2016  . Weight gain 11/15/2016  . Diverticulitis 10/29/2016  . Healthcare maintenance 08/15/2016  . Acquired hypothyroidism 06/20/2016  . Trigger finger of left thumb 06/17/2016  . Coronary artery disease involving native coronary artery of native heart without angina pectoris 05/21/2016  . Benign essential hypertension 04/19/2016  . Heart palpitations 04/19/2016  . Dyspepsia   . Diarrhea   . Diverticulosis of large intestine without diverticulitis   . Dupuytren's contracture 11/16/2015  . Arthritis of hand 07/25/2015  . 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 Depressive disorder 08/23/2013  . Sprain and strain of cruciate ligament of knee 08/23/2013  . Symptomatic menopausal or female climacteric states 07/22/2013  . Asthma 07/12/2013  . Diverticulosis of colon  07/12/2013  . Generalized osteoarthritis of multiple sites 07/12/2013  . Other and unspecified hyperlipidemia 07/12/2013    Past Surgical History:  Procedure Laterality Date  . CESAREAN SECTION     X3  . COLONOSCOPY WITH PROPOFOL N/A 12/21/2015   Procedure: COLONOSCOPY WITH PROPOFOL;  Surgeon: Wyline Mood, MD;  Location: [facility] ENDOSCOPY;  Service: Endoscopy;  Laterality: N/A;  . COLONOSCOPY WITH PROPOFOL N/A 12/16/2016   Procedure: COLONOSCOPY WITH PROPOFOL;  Surgeon: Wyline Mood, MD;  Location: [facility] ENDOSCOPY;  Service: Gastroenterology;  Laterality: N/A;  . DIAGNOSTIC LAPAROSCOPY    . DILATION AND CURETTAGE OF UTERUS    . ESOPHAGOGASTRODUODENOSCOPY (EGD) WITH PROPOFOL N/A 12/21/2015   Procedure: ESOPHAGOGASTRODUODENOSCOPY (EGD) WITH PROPOFOL;  Surgeon: Wyline Mood, MD;  Location: [facility] ENDOSCOPY;  Service: Endoscopy;  Laterality: N/A;  . ESOPHAGOGASTRODUODENOSCOPY (EGD) WITH PROPOFOL N/A 01/02/2016   Procedure: ESOPHAGOGASTRODUODENOSCOPY (EGD) WITH PROPOFOL;  Surgeon: Midge Minium, MD;  Location: [facility] ENDOSCOPY;  Service: Endoscopy;  Laterality: N/A;  . EXCISION MORTON'S NEUROMA Right   . FINGER ARTHROPLASTY Left 08/21/2015   Procedure: LEFT THUMB  ARTHROPLASTY (CMC);  Surgeon: Deeann Saint, MD;  Location: [facility] ORS;  Service: Orthopedics;  Laterality: Left;  . morton neuromas removal right foot    . TUBAL LIGATION    . TUMMY TUCK  2014    Prior to Admission medications   Medication Sig Start Date End Date Taking? Authorizing Provider  acetaminophen (TYLENOL) 500 MG tablet Take 1,000 mg by mouth every 6 (six) hours as needed for moderate pain, mild pain  or headache.    [provider]  albuterol (VENTOLIN HFA) 108 (90 Base) MCG/ACT inhaler Inhale 1-2 puffs into the lungs every 6 (six) hours as needed for shortness of breath or wheezing. 12/10/17   [provider]  Ascorbic Acid (VITAMIN C PO) Take 1 tablet by mouth daily.    [provider]  aspirin 81 MG EC tablet  Take 81 mg by mouth daily.    [provider]  BIOTIN PO Take 1 tablet by mouth daily.    [provider]  bismuth subsalicylate (PEPTO BISMOL) 262 MG chewable tablet Chew 524 mg by mouth as needed for indigestion.    [provider]  cetirizine (ZYRTEC) 10 MG tablet Take 10 mg by mouth daily as needed for allergies.    [provider]  fexofenadine (ALLEGRA) 180 MG tablet Take 180 mg by mouth daily as needed for allergies.    [provider]  gabapentin (NEURONTIN) 300 MG capsule Take 300 mg by mouth daily as needed for pain. 08/18/19   [provider]  gabapentin (NEURONTIN) 400 MG capsule Take 1 capsule (400 mg total) by mouth 3 (three) times daily. Patient not taking: Reported on 01/05/2020 08/21/15   Deeann Saint, MD  lisinopril-hydrochlorothiazide (ZESTORETIC) 20-25 MG tablet Take 1 tablet by mouth every evening. 08/30/19   [provider]  metoprolol succinate (TOPROL-XL) 25 MG 24 hr tablet Take 25 mg by mouth every evening. 08/30/19   [provider]  Multiple Vitamin (MULTI-VITAMIN) tablet Take 1 tablet by mouth daily.    [provider]  naproxen (NAPROSYN) 500 MG tablet Take 1 tablet (500 mg total) by mouth 2 (two) times daily with a meal. Patient not taking: No sig reported 09/17/18   Triplett, Cari B, FNP  OVER THE COUNTER MEDICATION Take 1 Dose by mouth daily. Intestinal Repair herbal supplement    [provider]  Polyethyl Glycol-Propyl Glycol (SYSTANE) 0.4-0.3 % SOLN Place 1 drop into both eyes daily as needed (dry eyes).    [provider]  Probiotic Product (PROBIOTIC PO) Take 1 Dose by mouth daily.    [provider]  promethazine (PHENERGAN) 25 MG tablet Take 1 tablet (25 mg total) by mouth every 6 (six) hours as needed for nausea or vomiting. Patient not taking: No sig reported 09/23/17   Minna Antis, MD  tetrahydrozoline-zinc (VISINE-AC) 0.05-0.25 % ophthalmic solution  Place 2 drops into both eyes daily as needed (allergies).    [provider]  traMADol (ULTRAM) 50 MG tablet Take 50 mg by mouth every 6 (six) hours as needed for pain. 09/03/19   [provider]  VITAMIN D PO Take 1 tablet by mouth daily.    [provider]    Allergies Latex, Ergotamine-caffeine, Other, Pineapple, Soy allergy, and Codeine  Family History  Problem Relation Age of Onset  . Breast cancer Maternal Aunt   . Stroke Mother   . Heart disease Mother   . Cancer Brother        LUNG  . Heart disease Father     Social History Social History   Tobacco Use  . Smoking status: Former Smoker    Types: Cigarettes  . Smokeless tobacco: Never Used  Vaping Use  . Vaping Use: Never used  Substance Use Topics  . Alcohol use: Yes    Alcohol/week: 2.0 standard drinks    Types: 2 Glasses of wine per week    Comment: RED WINE DAILY,none 24hrs  . Drug  use: No    Review of Systems Constitutional: No fever/chills Eyes: No visual changes. ENT: No sore throat. Cardiovascular: Denies chest pain. Respiratory: Denies shortness of breath. Gastrointestinal: No abdominal pain.  No nausea, no vomiting.  No diarrhea.   Genitourinary: Negative for dysuria. Musculoskeletal: Positive for left leg pain. Skin: Negative for rash. Neurological: Negative for headaches, focal weakness or numbness.  ____________________________________________   PHYSICAL EXAM:  VITAL SIGNS: ED Triage Vitals  Enc Vitals Group     BP 03/01/20 1534 (!) 167/76     Pulse Rate 03/01/20 1534 87     Resp 03/01/20 1534 20     Temp 03/01/20 1534 98.1 F (36.7 C)     Temp Source 03/01/20 1534 Oral     SpO2 --      Weight 03/01/20 1535 170 lb (77.1 kg)     Height 03/01/20 1535 5\' 2"  (1.575 m)     Head Circumference --      Peak Flow --      Pain Score 03/01/20 1535 9   Constitutional: Alert and oriented.  Eyes: Conjunctivae are normal.  ENT      Head: Normocephalic and  atraumatic.      Nose: No congestion/rhinnorhea.      Mouth/Throat: Mucous membranes are moist.      Neck: No stridor. Hematological/Lymphatic/Immunilogical: No cervical lymphadenopathy. Cardiovascular: Normal rate, regular rhythm.  No murmurs, rubs, or gallops.  Respiratory: Normal respiratory effort without tachypnea nor retractions. Breath sounds are clear and equal bilaterally. No wheezes/rales/rhonchi. Gastrointestinal: Soft and non tender. No rebound. No guarding.  Genitourinary: Deferred Musculoskeletal: Normal range of motion in all extremities. No lower extremity edema. DP 2+ bilateral legs.  Neurologic:  Normal speech and language. No gross focal neurologic deficits are appreciated.  Skin:  Skin is warm, dry and intact. No rash noted. Psychiatric: Mood and affect are normal. Speech and behavior are normal. Patient exhibits appropriate insight and judgment.  ____________________________________________    LABS (pertinent positives/negatives)  None  ____________________________________________   EKG  None  ____________________________________________    RADIOLOGY  03/03/20 left lower leg No DVT  ____________________________________________   PROCEDURES  Procedures  ____________________________________________   INITIAL IMPRESSION / ASSESSMENT AND PLAN / ED COURSE  Pertinent labs & imaging results that were available during my care of the patient were reviewed by me and considered in my medical decision making (see chart for details).   Patient presents to the emergency department today because of concern for left leg pain. On exam today patient without any swelling or mottling of skin to the left leg. Pulses 2+ bilaterally. [place] was performed which did not show any DVT. The patient had recent angio roughly 2 months ago. Did discuss repeat however patient was comfortable deferring at this time which I feel is reasonable given good pulses and no other indications of  significant occlusion at this time. Do wonder if pain could be more nerve related. Discussed this with the patient, will give neurology follow up.   ____________________________________________   FINAL CLINICAL IMPRESSION(S) / ED DIAGNOSES  Final diagnoses:  Left leg pain     Note: This dictation was prepared with Dragon dictation. Any transcriptional errors that result from this process are unintentional     [place], MD 03/01/20 1742

## 2020-03-01 NOTE — ED Triage Notes (Signed)
Pt comes from [facility] with c/o possible blot clot in leg.

## 2020-03-05 DIAGNOSIS — M1990 Unspecified osteoarthritis, unspecified site: Secondary | ICD-10-CM | POA: Insufficient documentation

## 2020-04-19 ENCOUNTER — Other Ambulatory Visit (INDEPENDENT_AMBULATORY_CARE_PROVIDER_SITE_OTHER): Payer: Self-pay | Admitting: Family

## 2020-04-19 DIAGNOSIS — I739 Peripheral vascular disease, unspecified: Secondary | ICD-10-CM

## 2020-04-20 ENCOUNTER — Ambulatory Visit (INDEPENDENT_AMBULATORY_CARE_PROVIDER_SITE_OTHER): Payer: 59

## 2020-04-20 ENCOUNTER — Ambulatory Visit (INDEPENDENT_AMBULATORY_CARE_PROVIDER_SITE_OTHER): Payer: 59 | Admitting: Vascular Surgery

## 2020-04-20 ENCOUNTER — Other Ambulatory Visit: Payer: Self-pay

## 2020-04-20 ENCOUNTER — Encounter (INDEPENDENT_AMBULATORY_CARE_PROVIDER_SITE_OTHER): Payer: Self-pay | Admitting: Vascular Surgery

## 2020-04-20 ENCOUNTER — Telehealth (INDEPENDENT_AMBULATORY_CARE_PROVIDER_SITE_OTHER): Payer: Self-pay

## 2020-04-20 VITALS — BP 159/86 | HR 74 | Ht 62.0 in | Wt 169.0 lb

## 2020-04-20 DIAGNOSIS — I70223 Atherosclerosis of native arteries of extremities with rest pain, bilateral legs: Secondary | ICD-10-CM

## 2020-04-20 DIAGNOSIS — I70229 Atherosclerosis of native arteries of extremities with rest pain, unspecified extremity: Secondary | ICD-10-CM | POA: Insufficient documentation

## 2020-04-20 DIAGNOSIS — I739 Peripheral vascular disease, unspecified: Secondary | ICD-10-CM

## 2020-04-20 DIAGNOSIS — I6523 Occlusion and stenosis of bilateral carotid arteries: Secondary | ICD-10-CM | POA: Diagnosis not present

## 2020-04-20 DIAGNOSIS — I10 Essential (primary) hypertension: Secondary | ICD-10-CM

## 2020-04-20 DIAGNOSIS — I2510 Atherosclerotic heart disease of native coronary artery without angina pectoris: Secondary | ICD-10-CM

## 2020-04-20 DIAGNOSIS — I70219 Atherosclerosis of native arteries of extremities with intermittent claudication, unspecified extremity: Secondary | ICD-10-CM | POA: Insufficient documentation

## 2020-04-20 DIAGNOSIS — E039 Hypothyroidism, unspecified: Secondary | ICD-10-CM

## 2020-04-20 NOTE — Telephone Encounter (Signed)
I attempted to contact the patient to schedule a kissing iliac stent procedure and a message was left for a return call.

## 2020-04-20 NOTE — Telephone Encounter (Signed)
Patient called back and is scheduled with Dr. Gilda Crease on 04/25/20 for a bilateral kissing iliac stent placements at [facility]. Covid testing on 04/21/20 between 8-2 pm at the [facility]. Pre-procedure instructions were discussed and will be mailed.

## 2020-04-20 NOTE — Progress Notes (Signed)
MRN : [id number]  Evelyn Simon is a 65 y.o. (10/09/1955) female who presents with chief complaint of  Chief Complaint  Patient presents with  . New Patient (Initial Visit)    Evelyn Simon. Intermittent. 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 ABI   .  History of Present Illness:   The patient is seen for evaluation of painful lower extremities and diminished pulses. Patient notes the pain is always associated with activity and is very consistent day today. Typically, the pain occurs at less than one block, progress is as activity continues to the point that the patient must stop walking. Resting including standing still for several minutes allowed resumption of the activity and the ability to walk a similar distance before stopping again. Uneven terrain and inclined shorten the distance. The pain has been progressive over the past several years. The patient states the inability to walk is now having a profound negative impact on quality of life and daily activities.  The patient denies rest pain or dangling of an extremity off the side of the bed during the night for relief. No open wounds or sores at this time. No prior interventions or surgeries.  No history of back problems or DJD of the lumbar sacral spine.   The patient denies changes in claudication symptoms or new rest pain symptoms.  No new ulcers or wounds of the foot.  The patient's blood pressure has been stable and relatively well controlled. The patient denies amaurosis fugax or recent TIA symptoms. There are no recent neurological changes noted. The patient denies history of DVT, PE or superficial thrombophlebitis. The patient denies recent episodes of angina or shortness of breath.   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  As I discussed  with her I believe the read by the radiologist significantly underestimates the left iliac stenosis.  No outpatient medications have been marked as taking for the 04/20/20 encounter (Office Visit) with Gilda Crease, Latina Craver, MD.    Past Medical History:  Diagnosis Date  . Arthritis   . Asthma   . Diverticulosis of colon   . GERD (gastroesophageal reflux disease)   . Headache    H/O MIGRAINES  . Hypertension   . Hypothyroidism     Past Surgical History:  Procedure Laterality Date  . CESAREAN SECTION     X3  . COLONOSCOPY WITH PROPOFOL N/A 12/21/2015   Procedure: COLONOSCOPY WITH PROPOFOL;  Surgeon: Wyline Mood, MD;  Location: [facility] ENDOSCOPY;  Service: Endoscopy;  Laterality: N/A;  . COLONOSCOPY WITH PROPOFOL N/A 12/16/2016   Procedure: COLONOSCOPY WITH PROPOFOL;  Surgeon: Wyline Mood, MD;  Location: [facility] ENDOSCOPY;  Service: Gastroenterology;  Laterality: N/A;  . DIAGNOSTIC LAPAROSCOPY    . DILATION AND CURETTAGE OF UTERUS    . ESOPHAGOGASTRODUODENOSCOPY (EGD) WITH PROPOFOL N/A 12/21/2015   Procedure: ESOPHAGOGASTRODUODENOSCOPY (EGD) WITH PROPOFOL;  Surgeon: Wyline Mood, MD;  Location: [facility] ENDOSCOPY;  Service: Endoscopy;  Laterality: N/A;  . ESOPHAGOGASTRODUODENOSCOPY (EGD) WITH PROPOFOL N/A 01/02/2016   Procedure: ESOPHAGOGASTRODUODENOSCOPY (EGD) WITH PROPOFOL;  Surgeon: Midge Minium, MD;  Location: [facility] ENDOSCOPY;  Service: Endoscopy;  Laterality: N/A;  . EXCISION MORTON'S NEUROMA Right   . FINGER ARTHROPLASTY Left 08/21/2015   Procedure: LEFT THUMB  ARTHROPLASTY (CMC);  Surgeon: Deeann Saint, MD;  Location: [facility] ORS;  Service: Orthopedics;  Laterality: Left;  . morton neuromas removal  right foot    . TUBAL LIGATION    . TUMMY TUCK  2014    Social History Social History   Tobacco Use  . Smoking status: Former Smoker    Types: Cigarettes  . Smokeless tobacco: Never Used  Vaping Use  . Vaping Use: Never used  Substance Use Topics  . Alcohol use: Yes    Alcohol/week: 2.0 standard  drinks    Types: 2 Glasses of wine per week    Comment: RED WINE DAILY,none 24hrs  . Drug use: No    Family History Family History  Problem Relation Age of Onset  . Breast cancer Maternal Aunt   . Stroke Mother   . Heart disease Mother   . Cancer Brother        LUNG  . Heart disease Father   No family history of bleeding/clotting disorders, porphyria or autoimmune disease   Allergies  Allergen Reactions  . Latex Anaphylaxis  . Ergotamine-Caffeine Hives    Body rash  . Other Hives and Itching     AVOCADO ALL DAIRY PRODUCTS AND AVOCADO  . Pineapple Other (See Comments)    MOUTH SORES  . Soy Allergy Hives  . Codeine Nausea And Vomiting     REVIEW OF SYSTEMS (Negative unless checked)  Constitutional: [] Weight loss  [] Fever  [] Chills Cardiac: [] Chest pain   [] Chest pressure   [] Palpitations   [] Shortness of breath when laying flat   [] Shortness of breath with exertion. Vascular:  [x] Pain in legs with walking   [x] Pain in legs at rest  [] History of DVT   [] Phlebitis   [] Swelling in legs   [] Varicose veins   [] Non-healing ulcers Pulmonary:   [] Uses home oxygen   [] Productive cough   [] Hemoptysis   [] Wheeze  [] COPD   [] Asthma Neurologic:  [] Dizziness   [] Seizures   [] History of stroke   [] History of TIA  [] Aphasia   [] Vissual changes   [] Weakness or numbness in arm   [] Weakness or numbness in leg Musculoskeletal:   [] Joint swelling   [] Joint pain   [] Low back pain Hematologic:  [] Easy bruising  [] Easy bleeding   [] Hypercoagulable state   [] Anemic Gastrointestinal:  [] Diarrhea   [] Vomiting  [] Gastroesophageal reflux/heartburn   [] Difficulty swallowing. Genitourinary:  [] Chronic kidney disease   [] Difficult urination  [] Frequent urination   [] Blood in urine Skin:  [] Rashes   [] Ulcers  Psychological:  [] History of anxiety   []  History of major depression.  Physical Examination  Vitals:   04/20/20 0822  BP: (!) 159/86  Pulse: 74  Weight: 169 lb (76.7 kg)  Height: 5\' 2"   (1.575 m)   Body mass index is 30.91 kg/m. Gen: WD/WN, NAD Head: [place]/AT, No temporalis wasting.  Ear/Nose/Throat: Hearing grossly intact, nares w/o erythema or drainage, poor dentition Eyes: PER, EOMI, sclera nonicteric.  Neck: Supple, no masses.  No bruit or JVD.  Pulmonary:  Good air movement, clear to auscultation bilaterally, no use of accessory muscles.  Cardiac: RRR, normal S1, S2, no Murmurs. Vascular:  Vessel Right Left  Radial Palpable Palpable  Gastrointestinal: soft, non-distended. No guarding/no peritoneal signs.  Musculoskeletal: M/S 5/5 throughout.  No deformity or atrophy.  Neurologic: CN 2-12 intact. Pain and light touch intact in extremities.  Symmetrical.  Speech is fluent. Motor exam as listed above. Psychiatric: Judgment intact, Mood & affect appropriate for pt's clinical situation. Dermatologic: No rashes or ulcers noted.  No changes consistent with cellulitis.   CBC Lab Results  Component Value Date  WBC 9.4 01/05/2020   HGB 14.1 01/05/2020   HCT 42.5 01/05/2020   MCV 96.6 01/05/2020   PLT 500 (H) 01/05/2020    BMET    Component Value Date/Time   NA 130 (L) 01/05/2020 1105   NA 134 (L) 01/30/2013 1220   K 3.6 01/05/2020 1105   K 3.8 01/30/2013 1220   CL 92 (L) 01/05/2020 1105   CL 101 01/30/2013 1220   CO2 26 01/05/2020 1105   CO2 30 01/30/2013 1220   GLUCOSE 107 (H) 01/05/2020 1105   GLUCOSE 93 01/30/2013 1220   BUN 9 01/05/2020 1105   BUN 13 01/30/2013 1220   CREATININE 0.81 01/05/2020 1105   CREATININE 0.99 01/30/2013 1220   CALCIUM 9.4 01/05/2020 1105   CALCIUM 9.5 01/30/2013 1220   GFRNONAA >60 01/05/2020 1105   GFRNONAA >60 01/30/2013 1220   GFRAA >60 01/31/2018 1241   GFRAA >60 01/30/2013 1220   CrCl cannot be calculated (Patient's most recent lab result is older than the maximum 21 days allowed.).  [facility] No results found for: INR, PROTIME  Radiology No results found.   Assessment/Plan 1. Atherosclerosis of native artery of  both lower extremities with rest pain (HCC) Recommend:  The patient has evidence of severe atherosclerotic changes of both lower extremities with rest pain that is associated with preulcerative changes and impending tissue loss of the foot.  This represents a limb threatening ischemia and places the patient at the risk for limb loss.  Patient should undergo angiography of the lower extremities with the hope for intervention for limb salvage.  Bilateral common iliac artery stenting has been recommended using the kissing balloon technique.  This was discussed in detail with the patient images of iliac stents were also reviewed with the patient.  The risks and benefits as well as the alternative therapies was discussed in detail with the patient.  All questions were answered.  Patient agrees to proceed with angiography.  The patient will follow up with me in the office after the procedure.    2. Bilateral carotid artery stenosis Recommend:  Given the patient's asymptomatic subcritical stenosis no further invasive testing or surgery at this time.  Continue antiplatelet therapy as prescribed Continue management of CAD, HTN and Hyperlipidemia Healthy heart diet,  encouraged exercise at least 4 times per week Follow up in 12 months with duplex ultrasound and physical exam   3. Benign essential hypertension Continue antihypertensive medications as already ordered, these medications have been reviewed and there are no changes at this time.   4. Coronary artery disease involving native coronary artery of native heart without angina pectoris Continue statin as ordered and reviewed, no changes at this time   5. Acquired hypothyroidism Continue hormone replacement as ordered and reviewed, no changes at this time    [name], MD  04/20/2020 8:23 AM

## 2020-04-20 NOTE — H&P (View-Only) (Signed)
MRN : [id number]  Evelyn Simon is a 65 y.o. (10/09/1955) female who presents with chief complaint of  Chief Complaint  Patient presents with  . New Patient (Initial Visit)    Evelyn Simon. Intermittent. 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 ABI   .  History of Present Illness:   The patient is seen for evaluation of painful lower extremities and diminished pulses. Patient notes the pain is always associated with activity and is very consistent day today. Typically, the pain occurs at less than one block, progress is as activity continues to the point that the patient must stop walking. Resting including standing still for several minutes allowed resumption of the activity and the ability to walk a similar distance before stopping again. Uneven terrain and inclined shorten the distance. The pain has been progressive over the past several years. The patient states the inability to walk is now having a profound negative impact on quality of life and daily activities.  The patient denies rest pain or dangling of an extremity off the side of the bed during the night for relief. No open wounds or sores at this time. No prior interventions or surgeries.  No history of back problems or DJD of the lumbar sacral spine.   The patient denies changes in claudication symptoms or new rest pain symptoms.  No new ulcers or wounds of the foot.  The patient's blood pressure has been stable and relatively well controlled. The patient denies amaurosis fugax or recent TIA symptoms. There are no recent neurological changes noted. The patient denies history of DVT, PE or superficial thrombophlebitis. The patient denies recent episodes of angina or shortness of breath.   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  As I discussed  with her I believe the read by the radiologist significantly underestimates the left iliac stenosis.  No outpatient medications have been marked as taking for the 04/20/20 encounter (Office Visit) with Gilda Crease, Latina Craver, MD.    Past Medical History:  Diagnosis Date  . Arthritis   . Asthma   . Diverticulosis of colon   . GERD (gastroesophageal reflux disease)   . Headache    H/O MIGRAINES  . Hypertension   . Hypothyroidism     Past Surgical History:  Procedure Laterality Date  . CESAREAN SECTION     X3  . COLONOSCOPY WITH PROPOFOL N/A 12/21/2015   Procedure: COLONOSCOPY WITH PROPOFOL;  Surgeon: Wyline Mood, MD;  Location: [facility] ENDOSCOPY;  Service: Endoscopy;  Laterality: N/A;  . COLONOSCOPY WITH PROPOFOL N/A 12/16/2016   Procedure: COLONOSCOPY WITH PROPOFOL;  Surgeon: Wyline Mood, MD;  Location: [facility] ENDOSCOPY;  Service: Gastroenterology;  Laterality: N/A;  . DIAGNOSTIC LAPAROSCOPY    . DILATION AND CURETTAGE OF UTERUS    . ESOPHAGOGASTRODUODENOSCOPY (EGD) WITH PROPOFOL N/A 12/21/2015   Procedure: ESOPHAGOGASTRODUODENOSCOPY (EGD) WITH PROPOFOL;  Surgeon: Wyline Mood, MD;  Location: [facility] ENDOSCOPY;  Service: Endoscopy;  Laterality: N/A;  . ESOPHAGOGASTRODUODENOSCOPY (EGD) WITH PROPOFOL N/A 01/02/2016   Procedure: ESOPHAGOGASTRODUODENOSCOPY (EGD) WITH PROPOFOL;  Surgeon: Midge Minium, MD;  Location: [facility] ENDOSCOPY;  Service: Endoscopy;  Laterality: N/A;  . EXCISION MORTON'S NEUROMA Right   . FINGER ARTHROPLASTY Left 08/21/2015   Procedure: LEFT THUMB  ARTHROPLASTY (CMC);  Surgeon: Deeann Saint, MD;  Location: [facility] ORS;  Service: Orthopedics;  Laterality: Left;  . morton neuromas removal  right foot    . TUBAL LIGATION    . TUMMY TUCK  2014    Social History Social History   Tobacco Use  . Smoking status: Former Smoker    Types: Cigarettes  . Smokeless tobacco: Never Used  Vaping Use  . Vaping Use: Never used  Substance Use Topics  . Alcohol use: Yes    Alcohol/week: 2.0 standard  drinks    Types: 2 Glasses of wine per week    Comment: RED WINE DAILY,none 24hrs  . Drug use: No    Family History Family History  Problem Relation Age of Onset  . Breast cancer Maternal Aunt   . Stroke Mother   . Heart disease Mother   . Cancer Brother        LUNG  . Heart disease Father   No family history of bleeding/clotting disorders, porphyria or autoimmune disease   Allergies  Allergen Reactions  . Latex Anaphylaxis  . Ergotamine-Caffeine Hives    Body rash  . Other Hives and Itching     AVOCADO ALL DAIRY PRODUCTS AND AVOCADO  . Pineapple Other (See Comments)    MOUTH SORES  . Soy Allergy Hives  . Codeine Nausea And Vomiting     REVIEW OF SYSTEMS (Negative unless checked)  Constitutional: []Weight loss  []Fever  []Chills Cardiac: []Chest pain   []Chest pressure   []Palpitations   []Shortness of breath when laying flat   []Shortness of breath with exertion. Vascular:  [x]Pain in legs with walking   [x]Pain in legs at rest  []History of DVT   []Phlebitis   []Swelling in legs   []Varicose veins   []Non-healing ulcers Pulmonary:   []Uses home oxygen   []Productive cough   []Hemoptysis   []Wheeze  []COPD   []Asthma Neurologic:  []Dizziness   []Seizures   []History of stroke   []History of TIA  []Aphasia   []Vissual changes   []Weakness or numbness in arm   []Weakness or numbness in leg Musculoskeletal:   []Joint swelling   []Joint pain   []Low back pain Hematologic:  []Easy bruising  []Easy bleeding   []Hypercoagulable state   []Anemic Gastrointestinal:  []Diarrhea   []Vomiting  []Gastroesophageal reflux/heartburn   []Difficulty swallowing. Genitourinary:  []Chronic kidney disease   []Difficult urination  []Frequent urination   []Blood in urine Skin:  []Rashes   []Ulcers  Psychological:  []History of anxiety   [] History of major depression.  Physical Examination  Vitals:   04/20/20 0822  BP: (!) 159/86  Pulse: 74  Weight: 169 lb (76.7 kg)  Height: 5' 2"  (1.575 m)   Body mass index is 30.91 kg/m. Gen: WD/WN, NAD Head: [place]/AT, No temporalis wasting.  Ear/Nose/Throat: Hearing grossly intact, nares w/o erythema or drainage, poor dentition Eyes: PER, EOMI, sclera nonicteric.  Neck: Supple, no masses.  No bruit or JVD.  Pulmonary:  Good air movement, clear to auscultation bilaterally, no use of accessory muscles.  Cardiac: RRR, normal S1, S2, no Murmurs. Vascular:  Vessel Right Left  Radial Palpable Palpable  Gastrointestinal: soft, non-distended. No guarding/no peritoneal signs.  Musculoskeletal: M/S 5/5 throughout.  No deformity or atrophy.  Neurologic: CN 2-12 intact. Pain and light touch intact in extremities.  Symmetrical.  Speech is fluent. Motor exam as listed above. Psychiatric: Judgment intact, Mood & affect appropriate for pt's clinical situation. Dermatologic: No rashes or ulcers noted.  No changes consistent with cellulitis.   CBC Lab Results  Component Value Date     WBC 9.4 01/05/2020   HGB 14.1 01/05/2020   HCT 42.5 01/05/2020   MCV 96.6 01/05/2020   PLT 500 (H) 01/05/2020    BMET    Component Value Date/Time   NA 130 (L) 01/05/2020 1105   NA 134 (L) 01/30/2013 1220   K 3.6 01/05/2020 1105   K 3.8 01/30/2013 1220   CL 92 (L) 01/05/2020 1105   CL 101 01/30/2013 1220   CO2 26 01/05/2020 1105   CO2 30 01/30/2013 1220   GLUCOSE 107 (H) 01/05/2020 1105   GLUCOSE 93 01/30/2013 1220   BUN 9 01/05/2020 1105   BUN 13 01/30/2013 1220   CREATININE 0.81 01/05/2020 1105   CREATININE 0.99 01/30/2013 1220   CALCIUM 9.4 01/05/2020 1105   CALCIUM 9.5 01/30/2013 1220   GFRNONAA >60 01/05/2020 1105   GFRNONAA >60 01/30/2013 1220   GFRAA >60 01/31/2018 1241   GFRAA >60 01/30/2013 1220   CrCl cannot be calculated (Patient's most recent lab result is older than the maximum 21 days allowed.).  COAG No results found for: INR, PROTIME  Radiology No results found.   Assessment/Plan 1. Atherosclerosis of native artery of  both lower extremities with rest pain (HCC) Recommend:  The patient has evidence of severe atherosclerotic changes of both lower extremities with rest pain that is associated with preulcerative changes and impending tissue loss of the foot.  This represents a limb threatening ischemia and places the patient at the risk for limb loss.  Patient should undergo angiography of the lower extremities with the hope for intervention for limb salvage.  Bilateral common iliac artery stenting has been recommended using the kissing balloon technique.  This was discussed in detail with the patient images of iliac stents were also reviewed with the patient.  The risks and benefits as well as the alternative therapies was discussed in detail with the patient.  All questions were answered.  Patient agrees to proceed with angiography.  The patient will follow up with me in the office after the procedure.    2. Bilateral carotid artery stenosis Recommend:  Given the patient's asymptomatic subcritical stenosis no further invasive testing or surgery at this time.  Continue antiplatelet therapy as prescribed Continue management of CAD, HTN and Hyperlipidemia Healthy heart diet,  encouraged exercise at least 4 times per week Follow up in 12 months with duplex ultrasound and physical exam   3. Benign essential hypertension Continue antihypertensive medications as already ordered, these medications have been reviewed and there are no changes at this time.   4. Coronary artery disease involving native coronary artery of native heart without angina pectoris Continue statin as ordered and reviewed, no changes at this time   5. Acquired hypothyroidism Continue hormone replacement as ordered and reviewed, no changes at this time    [name], MD  04/20/2020 8:23 AM

## 2020-04-21 ENCOUNTER — Other Ambulatory Visit
Admission: RE | Admit: 2020-04-21 | Discharge: 2020-04-21 | Disposition: A | Discharge status: Home / Self Care | Payer: 59 | Source: Ambulatory Visit | Attending: Vascular Surgery | Admitting: Vascular Surgery

## 2020-04-21 DIAGNOSIS — Z01812 Encounter for preprocedural laboratory examination: Secondary | ICD-10-CM | POA: Diagnosis present

## 2020-04-21 DIAGNOSIS — Z20822 Contact with and (suspected) exposure to covid-19: Secondary | ICD-10-CM | POA: Diagnosis not present

## 2020-04-21 LAB — SARS CORONAVIRUS 2 (TAT 6-24 HRS): SARS Coronavirus 2: NEGATIVE

## 2020-04-23 DIAGNOSIS — Z79899 Other long term (current) drug therapy: Secondary | ICD-10-CM | POA: Insufficient documentation

## 2020-04-23 DIAGNOSIS — M899 Disorder of bone, unspecified: Secondary | ICD-10-CM | POA: Insufficient documentation

## 2020-04-23 DIAGNOSIS — Z789 Other specified health status: Secondary | ICD-10-CM | POA: Insufficient documentation

## 2020-04-23 DIAGNOSIS — G894 Chronic pain syndrome: Secondary | ICD-10-CM | POA: Insufficient documentation

## 2020-04-23 NOTE — Progress Notes (Signed)
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 area of Pain) (Bilateral) (L>R) 04/24/2020  . Chronic groin pain (2ry area of Pain) (Bilateral) 04/24/2020  . Chronic hip pain (3ry area of Pain) (Bilateral) 04/24/2020  . 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 Chronic knee pain (5th area of Pain) (Bilateral) 04/24/2020  . Cervicalgia 04/24/2020  . Chronic neck pain (6th area of Pain) 04/24/2020  . Chronic shoulder pain (7th area of Pain) (Bilateral) 04/24/2020  . Chronic elbow pain (8th area of Pain) (Bilateral) 04/24/2020  . Chronic upper extremity pain (Bilateral) 04/24/2020   Resolved Ambulatory Problems    Diagnosis Date Noted  . No Resolved Ambulatory Problems   Past Medical History:  Diagnosis Date  . Arthritis   . GERD (gastroesophageal reflux disease)   . Headache   . Hypertension   . Hypothyroidism    Constitutional Exam  General appearance: Well nourished, well developed, and well hydrated. In no apparent acute distress Vitals:   04/24/20 1000  BP: (!) 145/77  Pulse: 79  Resp: 16  Temp: (!) 97.4 F (36.3  C)  TempSrc: Temporal  SpO2: 97%  Weight: 169 lb (76.7 kg)  Height: 5\' 2"  (1.575 m)   BMI Assessment: Estimated body mass index is 30.91 kg/m as calculated from the following:   Height as of this encounter: 5\' 2"  (1.575 m).   Weight as of this  encounter: 169 lb (76.7 kg).  BMI interpretation table: BMI level Category Range association with higher incidence of chronic pain  <18 kg/m2 Underweight   18.5-24.9 kg/m2 Ideal body weight   25-29.9 kg/m2 Overweight Increased incidence by 20%  30-34.9 kg/m2 Obese (Class I) Increased incidence by 68%  35-39.9 kg/m2 Severe obesity (Class II) Increased incidence by 136%  >40 kg/m2 Extreme obesity (Class III) Increased incidence by 254%   Patient's current BMI Ideal Body weight  Body mass index is 30.91 kg/m. Ideal body weight: 50.1 kg (110 lb 7.2 oz) Adjusted ideal body weight: 60.7 kg (133 lb 13.9 oz)   BMI Readings from Last 4 Encounters:  04/24/20 30.91 kg/m  04/20/20 30.91 kg/m  03/01/20 31.09 kg/m  01/05/20 29.81 kg/m   Wt Readings from Last 4 Encounters:  04/24/20 169 lb (76.7 kg)  04/20/20 169 lb (76.7 kg)  03/01/20 170 lb (77.1 kg)  01/05/20 163 lb (73.9 kg)    Psych/Mental status: Alert, oriented x 3 (person, place, & time)       Eyes: PERLA Respiratory: No evidence of acute respiratory distress  Assessment  Primary Diagnosis & Pertinent Problem List: The primary encounter diagnosis was Chronic pain syndrome. 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  Visit Diagnosis (New problems to examiner): 1. Chronic pain syndrome   2. Pharmacologic therapy   3. Disorder of skeletal system   4. Problems influencing health status   5. Peripheral vascular disease ([place])   6. Chronic lower extremity pain (1ry area of Pain) (Bilateral) (L>R)   7. Chronic groin pain, unspecified laterality   8. Chronic hip pain (3ry area of Pain) (Bilateral)   9. 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 Chronic knee pain (5th area of Pain) (Bilateral)   11. Cervicalgia   12. Chronic neck pain (6th area of Pain)   13. Chronic shoulder pain (7th area of Pain) (Bilateral)   14. Chronic elbow pain (8th area of Pain) (Bilateral)   15. Chronic upper extremity pain (Bilateral)    Plan of Care (Initial workup plan)  Note: Evelyn Simon was reminded that as per protocol, today's visit has been an evaluation only. We have not taken over the patient's controlled substance management.  Problem-specific plan: No problem-specific Assessment & Plan notes found for this encounter.   Lab Orders     Compliance Drug Analysis, Ur     Comp. Metabolic Panel (12)     Magnesium     Vitamin B12     Sedimentation rate     25-Hydroxy vitamin D Lcms D2+D3     C-reactive protein  Imaging Orders     DG Cervical Spine With Flex & Extend     DG HIP UNILAT W OR W/O PELVIS 2-3 VIEWS RIGHT     DG HIP UNILAT W OR W/O PELVIS 2-3 VIEWS LEFT     DG Knee 1-2 Views Right     DG Knee 1-2 Views Left     DG Shoulder Right  DG Shoulder Left     DG ELBOW COMPLETE LEFT (3+VIEW)     DG ELBOW COMPLETE RIGHT (3+VIEW) Referral Orders  No referral(s) requested today   Procedure Orders    No procedure(s) ordered today   Pharmacotherapy (current): Medications ordered:  No orders of the defined types were placed in this encounter.  Medications administered during this visit: Taralyn Panagopoulos had no  medications administered during this visit.   Pharmacological management options:  Opioid Analgesics: The patient was informed that there is no guarantee that she would be a candidate for opioid analgesics. The decision will be made following CDC guidelines. This decision will be based on the results of diagnostic studies, as well as Evelyn Simon's risk profile.   Membrane stabilizer: To be determined at a later time  Muscle relaxant: To be determined at a later time  NSAID: To be determined at a later time  Other analgesic(s): To be determined at a later time   Interventional management options: Evelyn Simon was informed that there is no guarantee that she would be a candidate for interventional therapies. The decision will be based on the results of diagnostic studies, as well as Evelyn Simon's risk profile.  Procedure(s) under consideration:  Pending revascularization surgery and results of testing.   Provider-requested follow-up: Return for (40 min)(2V), (F2F), (s/p Tests).  No future appointments.  Note by: Gaspar Cola, MD Date: 04/24/2020; Time: 11:06 AM

## 2020-04-24 ENCOUNTER — Ambulatory Visit: Payer: 59 | Attending: Pain Medicine | Admitting: Pain Medicine

## 2020-04-24 ENCOUNTER — Other Ambulatory Visit: Payer: Self-pay

## 2020-04-24 ENCOUNTER — Other Ambulatory Visit (INDEPENDENT_AMBULATORY_CARE_PROVIDER_SITE_OTHER): Payer: Self-pay | Admitting: Nurse Practitioner

## 2020-04-24 ENCOUNTER — Encounter: Payer: Self-pay | Admitting: Pain Medicine

## 2020-04-24 VITALS — BP 145/77 | HR 79 | Temp 97.4°F | Resp 16 | Ht 62.0 in | Wt 169.0 lb

## 2020-04-24 DIAGNOSIS — I739 Peripheral vascular disease, unspecified: Secondary | ICD-10-CM

## 2020-04-24 DIAGNOSIS — Z79899 Other long term (current) drug therapy: Secondary | ICD-10-CM

## 2020-04-24 DIAGNOSIS — M25511 Pain in right shoulder: Secondary | ICD-10-CM | POA: Insufficient documentation

## 2020-04-24 DIAGNOSIS — M79605 Pain in left leg: Secondary | ICD-10-CM | POA: Diagnosis present

## 2020-04-24 DIAGNOSIS — G894 Chronic pain syndrome: Secondary | ICD-10-CM

## 2020-04-24 DIAGNOSIS — M25562 Pain in left knee: Secondary | ICD-10-CM | POA: Insufficient documentation

## 2020-04-24 DIAGNOSIS — Z789 Other specified health status: Secondary | ICD-10-CM

## 2020-04-24 DIAGNOSIS — M542 Cervicalgia: Secondary | ICD-10-CM | POA: Diagnosis present

## 2020-04-24 DIAGNOSIS — M25551 Pain in right hip: Secondary | ICD-10-CM | POA: Diagnosis present

## 2020-04-24 DIAGNOSIS — M25512 Pain in left shoulder: Secondary | ICD-10-CM | POA: Insufficient documentation

## 2020-04-24 DIAGNOSIS — M79604 Pain in right leg: Secondary | ICD-10-CM | POA: Diagnosis present

## 2020-04-24 DIAGNOSIS — G8929 Other chronic pain: Secondary | ICD-10-CM | POA: Diagnosis present

## 2020-04-24 DIAGNOSIS — M79602 Pain in left arm: Secondary | ICD-10-CM | POA: Insufficient documentation

## 2020-04-24 DIAGNOSIS — M533 Sacrococcygeal disorders, not elsewhere classified: Secondary | ICD-10-CM | POA: Diagnosis present

## 2020-04-24 DIAGNOSIS — M25561 Pain in right knee: Secondary | ICD-10-CM | POA: Insufficient documentation

## 2020-04-24 DIAGNOSIS — M79601 Pain in right arm: Secondary | ICD-10-CM | POA: Insufficient documentation

## 2020-04-24 DIAGNOSIS — M899 Disorder of bone, unspecified: Secondary | ICD-10-CM

## 2020-04-24 DIAGNOSIS — M25529 Pain in unspecified elbow: Secondary | ICD-10-CM | POA: Diagnosis present

## 2020-04-24 DIAGNOSIS — M25552 Pain in left hip: Secondary | ICD-10-CM | POA: Insufficient documentation

## 2020-04-24 DIAGNOSIS — R1030 Lower abdominal pain, unspecified: Secondary | ICD-10-CM | POA: Insufficient documentation

## 2020-04-24 NOTE — Patient Instructions (Signed)

## 2020-04-24 NOTE — Progress Notes (Signed)
Patient was called to reschedule her procedure time on 4/12 for an 8am start time per Dr. Marijean Heath request.  Patient will arrive at registration at 6:45am

## 2020-04-25 ENCOUNTER — Ambulatory Visit
Admission: RE | Admit: 2020-04-25 | Discharge: 2020-04-25 | Disposition: A | Discharge status: Home / Self Care | Payer: 59 | Attending: Vascular Surgery | Admitting: Vascular Surgery

## 2020-04-25 ENCOUNTER — Encounter: Payer: Self-pay | Admitting: Vascular Surgery

## 2020-04-25 ENCOUNTER — Other Ambulatory Visit: Payer: Self-pay

## 2020-04-25 ENCOUNTER — Encounter
Admission: RE | Disposition: A | Discharge status: Home / Self Care | Payer: Self-pay | Source: Home / Self Care | Attending: Vascular Surgery

## 2020-04-25 DIAGNOSIS — I6523 Occlusion and stenosis of bilateral carotid arteries: Secondary | ICD-10-CM | POA: Insufficient documentation

## 2020-04-25 DIAGNOSIS — I10 Essential (primary) hypertension: Secondary | ICD-10-CM | POA: Diagnosis not present

## 2020-04-25 DIAGNOSIS — Z8249 Family history of ischemic heart disease and other diseases of the circulatory system: Secondary | ICD-10-CM | POA: Insufficient documentation

## 2020-04-25 DIAGNOSIS — E039 Hypothyroidism, unspecified: Secondary | ICD-10-CM | POA: Insufficient documentation

## 2020-04-25 DIAGNOSIS — Z885 Allergy status to narcotic agent status: Secondary | ICD-10-CM | POA: Insufficient documentation

## 2020-04-25 DIAGNOSIS — Z91040 Latex allergy status: Secondary | ICD-10-CM | POA: Diagnosis not present

## 2020-04-25 DIAGNOSIS — I708 Atherosclerosis of other arteries: Secondary | ICD-10-CM | POA: Diagnosis present

## 2020-04-25 DIAGNOSIS — Z87891 Personal history of nicotine dependence: Secondary | ICD-10-CM | POA: Diagnosis not present

## 2020-04-25 DIAGNOSIS — I70229 Atherosclerosis of native arteries of extremities with rest pain, unspecified extremity: Secondary | ICD-10-CM

## 2020-04-25 DIAGNOSIS — Z8719 Personal history of other diseases of the digestive system: Secondary | ICD-10-CM | POA: Insufficient documentation

## 2020-04-25 DIAGNOSIS — I70223 Atherosclerosis of native arteries of extremities with rest pain, bilateral legs: Secondary | ICD-10-CM | POA: Diagnosis not present

## 2020-04-25 DIAGNOSIS — I2510 Atherosclerotic heart disease of native coronary artery without angina pectoris: Secondary | ICD-10-CM | POA: Diagnosis not present

## 2020-04-25 DIAGNOSIS — I739 Peripheral vascular disease, unspecified: Secondary | ICD-10-CM | POA: Diagnosis not present

## 2020-04-25 HISTORY — PX: LOWER EXTREMITY ANGIOGRAPHY: CATH118251

## 2020-04-25 LAB — CREATININE, SERUM
Creatinine, Ser: 0.67 mg/dL (ref 0.44–1.00)
GFR, Estimated: >60 mL/min (ref >60)

## 2020-04-25 LAB — BUN: BUN: 13 mg/dL (ref 8–23)

## 2020-04-25 SURGERY — LOWER EXTREMITY ANGIOGRAPHY
Anesthesia: Moderate Sedation | Laterality: Bilateral

## 2020-04-25 MED ORDER — MIDAZOLAM HCL 2 MG/2ML IJ SOLN
INTRAMUSCULAR | Status: DC | PRN
  Administered 2020-04-25 (×2): 0.5 mg via INTRAVENOUS
  Administered 2020-04-25 (×2): 1 mg via INTRAVENOUS
  Administered 2020-04-25: 2 mg via INTRAVENOUS

## 2020-04-25 MED ORDER — CLOPIDOGREL BISULFATE 300 MG PO TABS: 300.0000 mg | ORAL_TABLET | ORAL | Status: DC

## 2020-04-25 MED ORDER — HEPARIN SODIUM (PORCINE) 1000 UNIT/ML IJ SOLN
INTRAMUSCULAR | Status: AC
  Filled 2020-04-25: qty 1

## 2020-04-25 MED ORDER — DIPHENHYDRAMINE HCL 50 MG/ML IJ SOLN: 50.0000 mg | Freq: Once | INTRAMUSCULAR | Status: DC | PRN

## 2020-04-25 MED ORDER — SODIUM CHLORIDE 0.9% FLUSH: 3.0000 mL | Freq: Two times a day (BID) | INTRAVENOUS | Status: DC

## 2020-04-25 MED ORDER — MIDAZOLAM HCL 5 MG/5ML IJ SOLN
INTRAMUSCULAR | Status: AC
  Filled 2020-04-25: qty 5

## 2020-04-25 MED ORDER — FENTANYL CITRATE (PF) 100 MCG/2ML IJ SOLN
INTRAMUSCULAR | Status: AC
  Filled 2020-04-25: qty 2

## 2020-04-25 MED ORDER — HEPARIN SODIUM (PORCINE) 1000 UNIT/ML IJ SOLN
INTRAMUSCULAR | Status: DC | PRN
  Administered 2020-04-25: 5000 [IU] via INTRAVENOUS

## 2020-04-25 MED ORDER — HYDROMORPHONE HCL 1 MG/ML IJ SOLN: 1.0000 mg | Freq: Once | INTRAMUSCULAR | Status: DC | PRN

## 2020-04-25 MED ORDER — FAMOTIDINE 20 MG PO TABS: 40.0000 mg | ORAL_TABLET | Freq: Once | ORAL | Status: DC | PRN

## 2020-04-25 MED ORDER — CLOPIDOGREL BISULFATE 75 MG PO TABS: 75.0000 mg | ORAL_TABLET | Freq: Every day | ORAL | 4 refills | Status: DC

## 2020-04-25 MED ORDER — CEFAZOLIN SODIUM-DEXTROSE 2-4 GM/100ML-% IV SOLN
INTRAVENOUS | Status: AC
  Administered 2020-04-25: 2 g via INTRAVENOUS
  Filled 2020-04-25: qty 100

## 2020-04-25 MED ORDER — IODIXANOL 320 MG/ML IV SOLN
INTRAVENOUS | Status: DC | PRN
  Administered 2020-04-25: 60 mL

## 2020-04-25 MED ORDER — SODIUM CHLORIDE 0.9% FLUSH: 3.0000 mL | INTRAVENOUS | Status: DC | PRN

## 2020-04-25 MED ORDER — MIDAZOLAM HCL 2 MG/ML PO SYRP: 8.0000 mg | ORAL_SOLUTION | Freq: Once | ORAL | Status: DC | PRN

## 2020-04-25 MED ORDER — SODIUM CHLORIDE 0.9 % IV SOLN: INTRAVENOUS | Status: DC

## 2020-04-25 MED ORDER — LABETALOL HCL 5 MG/ML IV SOLN: 10.0000 mg | INTRAVENOUS | Status: DC | PRN

## 2020-04-25 MED ORDER — SODIUM CHLORIDE 0.9 % IV SOLN
250.0000 mL | INTRAVENOUS | Status: DC | PRN
Start: 2020-04-25 — End: 2020-04-25

## 2020-04-25 MED ORDER — METHYLPREDNISOLONE SODIUM SUCC 125 MG IJ SOLR: 125.0000 mg | Freq: Once | INTRAMUSCULAR | Status: DC | PRN

## 2020-04-25 MED ORDER — OXYCODONE HCL 5 MG PO TABS: 5.0000 mg | ORAL_TABLET | ORAL | Status: DC | PRN

## 2020-04-25 MED ORDER — CEFAZOLIN SODIUM-DEXTROSE 2-4 GM/100ML-% IV SOLN: 2.0000 g | Freq: Once | INTRAVENOUS | Status: AC

## 2020-04-25 MED ORDER — HYDRALAZINE HCL 20 MG/ML IJ SOLN: 5.0000 mg | INTRAMUSCULAR | Status: DC | PRN

## 2020-04-25 MED ORDER — ATORVASTATIN CALCIUM 10 MG PO TABS: 10.0000 mg | ORAL_TABLET | Freq: Every day | ORAL | 3 refills | Status: DC

## 2020-04-25 MED ORDER — ACETAMINOPHEN 325 MG PO TABS: 650.0000 mg | ORAL_TABLET | ORAL | Status: DC | PRN

## 2020-04-25 MED ORDER — MORPHINE SULFATE (PF) 4 MG/ML IV SOLN: 2.0000 mg | INTRAVENOUS | Status: DC | PRN

## 2020-04-25 MED ORDER — ONDANSETRON HCL 4 MG/2ML IJ SOLN: 4.0000 mg | Freq: Four times a day (QID) | INTRAMUSCULAR | Status: DC | PRN

## 2020-04-25 MED ORDER — FENTANYL CITRATE (PF) 100 MCG/2ML IJ SOLN
INTRAMUSCULAR | Status: DC | PRN
  Administered 2020-04-25: 50 ug via INTRAVENOUS
  Administered 2020-04-25: 25 ug via INTRAVENOUS
  Administered 2020-04-25: 50 ug via INTRAVENOUS
  Administered 2020-04-25 (×3): 25 ug via INTRAVENOUS

## 2020-04-25 SURGICAL SUPPLY — 16 items
BALLN LUTONIX DCB 7X40X130 (BALLOONS) ×4
BALLOON LUTONIX DCB 7X40X130 (BALLOONS) ×2 IMPLANT
CANNULA 5F STIFF (CANNULA) ×2 IMPLANT
CATH ANGIO 5F PIGTAIL 65CM (CATHETERS) ×4 IMPLANT
COVER EZ STRL 42X30 (DRAPES) ×2 IMPLANT
COVER PROBE U/S 5X48 (MISCELLANEOUS) ×2 IMPLANT
DEVICE STARCLOSE SE CLOSURE (Vascular Products) ×4 IMPLANT
GLIDEWIRE ADV .035X180CM (WIRE) ×2 IMPLANT
INTRODUCER 7FR 23CM (INTRODUCER) ×4 IMPLANT
KIT ENCORE 26 ADVANTAGE (KITS) ×4 IMPLANT
PACK ANGIOGRAPHY (CUSTOM PROCEDURE TRAY) ×2 IMPLANT
SHEATH BRITE TIP 5FRX11 (SHEATH) ×4 IMPLANT
STENT LIFESTREAM 6X58X80 (Permanent Stent) ×4 IMPLANT
SYR MEDRAD MARK 7 150ML (SYRINGE) ×2 IMPLANT
TUBING CONTRAST HIGH PRESS 72 (TUBING) ×2 IMPLANT
WIRE GUIDERIGHT .035X150 (WIRE) ×2 IMPLANT

## 2020-04-25 NOTE — Progress Notes (Signed)
Dr. Gilda Crease at bedside, speaking with pt. And family member. Both verbalize understanding of conversation.  Pt. Stable for Discharge home.

## 2020-04-25 NOTE — Op Note (Signed)
[facility]  Percutaneous Study/Intervention Procedural Note   Date of Surgery: 04/25/2020  Surgeon:Kehaulani Fruin, Latina Craver   Pre-operative Diagnosis: Atherosclerotic occlusive disease bilateral lower extremities with lifestyle limiting claudication and rest pain symptoms.  Post-operative diagnosis:  Same  Procedure(s) Performed:  1.  Abdominal aortogram  2.  Ultrasound guided access bilateral common femoral arteries  3.  Percutaneous transluminal angioplasty and stent placement right common iliac artery; "kissing balloon" technique  4.  Percutaneous transluminal and plasty and stent placement left common iliac artery; "kissing balloon" technique  5.  StarClose closure device bilateral common femoral arteries     Anesthesia: Conscious sedation was administered under my direct supervision by the interventional radiology RN. IV Versed plus fentanyl were utilized. Continuous ECG, pulse oximetry and blood pressure was monitored throughout the entire procedure. Conscious sedation was for a total of 1 hour 3 minutes 41 seconds.  Sheath: 7 French 23 cm Pinnacle sheath retrograde right common femoral 7 French 23 cm Pinnacle sheath retrograde left common femoral  Contrast: 60  Fluoroscopy Time: 5.9  Indications: Patient presented to the office with increasing leg pain.  She described short distance claudication that was interfering with virtually all of her daily activities as well as early rest pain symptoms.  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  Risk and benefits for angiography with intervention were reviewed all questions were answered patient agrees to proceed  Procedure:  Evelyn Simon a 65 y.o. female who was identified and appropriate procedural time out was performed.  The patient was then placed supine on the table and prepped and draped in the usual  sterile fashion.  Ultrasound was used to evaluate the right common femoral artery.  It was echolucent and pulsatile indicating it is patent .  An ultrasound image was acquired for the permanent record.  A micropuncture needle was used to access the right common femoral artery under direct ultrasound guidance.  The microwire was then advanced under fluoroscopic guidance without difficulty followed by the micro-sheath  A 0.035 J wire was advanced without resistance and a 5Fr sheath was placed.    The pigtail catheter was then positioned at the level of T12 and an AP image of the aorta was obtained. After review the images the pigtail catheter was repositioned above the aortic bifurcation and bilateral oblique views of the pelvis were obtained. Subsequently the detector was positioned to the LAO orientation.  After review the images the ultrasound was reprepped and delivered back onto the sterile field. The left common femoral was then imaged with the ultrasound it was noted to be echolucent and pulsatile indicating patency. Images recorded for the permanent record. Under real-time visualization a microneedle was inserted into the anterior wall the common femoral artery microwire was then advanced without difficulty under fluoroscopic guidance followed by placement of the micro-sheath.  A 0.035 advantage wire wire was then negotiated under fluoroscopic guidance into the aorta.  7 French sheath was then placed.  5000 units of heparin was given and allowed to circulate for proximally 4 minutes.  The right femoral sheath was then upsized to a 7 [place] sheath as well after a 0.035 advantage wire was advanced through the pigtail catheter. Magnified images of the aortic bifurcation were then made using hand injection contrast from the femoral sheaths. After appropriate sizing a 6 mm x 58 mm lifestream stent was selected for the right and  a 6 mm x 58 mm lifestream stent was selected for the left. There were then  advanced and positioned just above the aortic bifurcation. Insufflation for full expansion of the stents was performed simultaneously. Follow-up imaging was then performed by hand-injection through the right sheath and the right stent appeared to be slightly undersized.  Therefore a 7 mm x 40 mm balloon was advanced up both the right and left and simultaneous inflation was again performed.  2 serial inflations were required to cover the length of the stent.  The pigtail catheter was then introduced up the left and bolus injection of contrast was used to perform final imaging of the distal aortic reconstruction.  Oblique views were then obtained of the groins in succession and Star close device is deployed without difficulty. There were no immediate complications   Findings:   Aortogram:  The abdominal aorta is opacified with a bolus injection contrast. Demonstrates diffuse disease but there are no hemodynamically significant lesions noted until the distal aortic bifurcation where bilateral greater than 80% distal aorta with right ostial iliac lesions are identified.  There is a 60 to 70% proximal left common iliac artery stenosis as well.  The distal common iliac arteries are widely patent bilaterally internal and external iliac arteries are widely patent.  The right common femoral and visualized portions of the profunda femoris and SFA are patent.  The left common femoral and visualized portions of the profunda femoris and SFA are widely patent.  Following placement of the iliac stents there is now wide patency with less than 5% residual stenosis with rapid flow through the aortic bifurcation bilaterally.  Summary:  Successful reconstruction of the distal aorta and bilateral iliac arteries  Disposition: Patient was taken to the recovery room in stable condition having tolerated the procedure well.  Evelyn Simon 04/25/2020,9:37 AM

## 2020-04-25 NOTE — Interval H&P Note (Signed)
History and Physical Interval Note:  04/25/2020 7:56 AM  Evelyn Simon  has presented today for surgery, with the diagnosis of Bilateral Iliac Stent   Kissing    ASO w rest pain Covid April 8.  The various methods of treatment have been discussed with the patient and family. After consideration of risks, benefits and other options for treatment, the patient has consented to  Procedure(s): LOWER EXTREMITY ANGIOGRAPHY (Bilateral) as a surgical intervention.  The patient's history has been reviewed, patient examined, no change in status, stable for surgery.  I have reviewed the patient's chart and labs.  Questions were answered to the patient's satisfaction.     [name]

## 2020-04-28 LAB — COMPLIANCE DRUG ANALYSIS, UR

## 2020-05-01 LAB — C-REACTIVE PROTEIN: CRP: 11 mg/L — ABNORMAL HIGH (ref 0–10)

## 2020-05-01 LAB — 25-HYDROXY VITAMIN D LCMS D2+D3
25-Hydroxy, Vitamin D-2: <1 ng/mL
25-Hydroxy, Vitamin D-3: 63 ng/mL
25-Hydroxy, Vitamin D: 63 ng/mL

## 2020-05-01 LAB — COMP. METABOLIC PANEL (12)
AST: 26 IU/L (ref 0–40)
Albumin/Globulin Ratio: 2.1 (ref 1.2–2.2)
Albumin: 4.7 g/dL (ref 3.8–4.8)
Alkaline Phosphatase: 94 IU/L (ref 44–121)
BUN/Creatinine Ratio: 11 — ABNORMAL LOW (ref 12–28)
BUN: 9 mg/dL (ref 8–27)
Bilirubin Total: 0.3 mg/dL (ref 0.0–1.2)
Calcium: 9.9 mg/dL (ref 8.7–10.3)
Chloride: 92 mmol/L — ABNORMAL LOW (ref 96–106)
Creatinine, Ser: 0.85 mg/dL (ref 0.57–1.00)
Globulin, Total: 2.2 g/dL (ref 1.5–4.5)
Glucose: 94 mg/dL (ref 65–99)
Potassium: 4.2 mmol/L (ref 3.5–5.2)
Sodium: 132 mmol/L — ABNORMAL LOW (ref 134–144)
Total Protein: 6.9 g/dL (ref 6.0–8.5)
eGFR: 77 mL/min/{1.73_m2} (ref >59)

## 2020-05-01 LAB — MAGNESIUM: Magnesium: 2.1 mg/dL (ref 1.6–2.3)

## 2020-05-01 LAB — SEDIMENTATION RATE: Sed Rate: 18 mm/hr (ref 0–40)

## 2020-05-01 LAB — VITAMIN B12: Vitamin B-12: 518 pg/mL (ref 232–1245)

## 2020-05-12 ENCOUNTER — Other Ambulatory Visit (INDEPENDENT_AMBULATORY_CARE_PROVIDER_SITE_OTHER): Payer: Self-pay | Admitting: Vascular Surgery

## 2020-05-12 DIAGNOSIS — Z95820 Peripheral vascular angioplasty status with implants and grafts: Secondary | ICD-10-CM

## 2020-05-12 DIAGNOSIS — I70213 Atherosclerosis of native arteries of extremities with intermittent claudication, bilateral legs: Secondary | ICD-10-CM

## 2020-05-15 ENCOUNTER — Encounter (INDEPENDENT_AMBULATORY_CARE_PROVIDER_SITE_OTHER): Payer: 59

## 2020-05-15 ENCOUNTER — Ambulatory Visit (INDEPENDENT_AMBULATORY_CARE_PROVIDER_SITE_OTHER): Payer: 59 | Admitting: Vascular Surgery

## 2020-05-17 ENCOUNTER — Ambulatory Visit (INDEPENDENT_AMBULATORY_CARE_PROVIDER_SITE_OTHER): Payer: 59 | Admitting: Nurse Practitioner

## 2020-05-17 ENCOUNTER — Ambulatory Visit (INDEPENDENT_AMBULATORY_CARE_PROVIDER_SITE_OTHER): Payer: 59

## 2020-05-17 ENCOUNTER — Other Ambulatory Visit: Payer: Self-pay

## 2020-05-17 ENCOUNTER — Encounter (INDEPENDENT_AMBULATORY_CARE_PROVIDER_SITE_OTHER): Payer: Self-pay | Admitting: Nurse Practitioner

## 2020-05-17 VITALS — BP 149/64 | HR 90 | Resp 16 | Wt 166.4 lb

## 2020-05-17 DIAGNOSIS — G8929 Other chronic pain: Secondary | ICD-10-CM

## 2020-05-17 DIAGNOSIS — Z95820 Peripheral vascular angioplasty status with implants and grafts: Secondary | ICD-10-CM | POA: Diagnosis not present

## 2020-05-17 DIAGNOSIS — I10 Essential (primary) hypertension: Secondary | ICD-10-CM

## 2020-05-17 DIAGNOSIS — I70213 Atherosclerosis of native arteries of extremities with intermittent claudication, bilateral legs: Secondary | ICD-10-CM | POA: Diagnosis not present

## 2020-05-17 DIAGNOSIS — M25551 Pain in right hip: Secondary | ICD-10-CM | POA: Diagnosis not present

## 2020-05-17 DIAGNOSIS — M25552 Pain in left hip: Secondary | ICD-10-CM

## 2020-05-17 NOTE — Progress Notes (Signed)
Subjective:    Patient ID: Evelyn Simon, female    DOB: [birth year], 65 y.o.   MRN: [id number] Chief Complaint  Patient presents with  . Follow-up    [facility] 2wk post le angio     Evelyn Simon is a 65 year old female that returns to the office for followup and review of the noninvasive studies.  Patient underwent intervention on 04/25/2020 including:  Procedure(s) Performed:             1.  Abdominal aortogram             2.  Ultrasound guided access bilateral common femoral arteries             3.  Percutaneous transluminal angioplasty and stent placement right common iliac artery; "kissing balloon" technique             4.  Percutaneous transluminal and plasty and stent placement left common iliac artery; "kissing balloon" technique             5.  StarClose closure device bilateral common femoral arteries               There have been no interval changes in lower extremity symptoms. No interval shortening of the patient's claudication distance or development of rest pain symptoms. No new ulcers or wounds have occurred since the last visit.  There have been no significant changes to the patient's overall health care.  The patient denies amaurosis fugax or recent TIA symptoms. There are no recent neurological changes noted. The patient denies history of DVT, PE or superficial thrombophlebitis. The patient denies recent episodes of angina or shortness of breath.   ABI Rt=1.16 and Lt=1.16  (previous ABI's Rt=0.84 and Lt=1.10) Duplex ultrasound of the bilateral tibial arteries reveals triphasic waveforms bilaterally with good toe waveforms bilaterally.   Review of Systems  Musculoskeletal: Positive for arthralgias.  All other systems reviewed and are negative.      Objective:   Physical Exam Vitals reviewed.  HENT:     Head: Normocephalic.  Cardiovascular:     Rate and Rhythm: Normal rate.     Pulses: Normal pulses.  Pulmonary:     Effort: Pulmonary effort is normal.   Skin:    General: Skin is warm and dry.  Neurological:     Mental Status: She is alert and oriented to person, place, and time.  Psychiatric:        Mood and Affect: Mood normal.        Behavior: Behavior normal.        Thought Content: Thought content normal.        Judgment: Judgment normal.     BP (!) 149/64 (BP Location: Left Arm)   Pulse 90   Resp 16   Wt 166 lb 6.4 oz (75.5 kg)   BMI 30.43 kg/m   Past Medical History:  Diagnosis Date  . Arthritis   . Asthma   . Diverticulosis of colon   . GERD (gastroesophageal reflux disease)   . Headache    H/O MIGRAINES  . Hypertension   . Hypothyroidism     Social History   Socioeconomic History  . Marital status: Divorced    Spouse name: Not on file  . Number of children: Not on file  . Years of education: Not on file  . Highest education level: Not on file  Occupational History  . Not on file  Tobacco Use  . Smoking status: Former Smoker    Types:  Cigarettes  . Smokeless tobacco: Never Used  Vaping Use  . Vaping Use: Never used  Substance and Sexual Activity  . Alcohol use: Yes    Alcohol/week: 7.0 standard drinks    Types: 7 Glasses of wine per week  . Drug use: No  . Sexual activity: Never  Other Topics Concern  . Not on file  Social History Narrative  . Not on file   Social Determinants of Health   Financial Resource Strain: Not on file  Food Insecurity: Not on file  Transportation Needs: Not on file  Physical Activity: Not on file  Stress: Not on file  Social Connections: Not on file  Intimate Partner Violence: Not on file    Past Surgical History:  Procedure Laterality Date  . CESAREAN SECTION     X3  . COLONOSCOPY WITH PROPOFOL N/A 12/21/2015   Procedure: COLONOSCOPY WITH PROPOFOL;  Surgeon: Wyline Mood, MD;  Location: [facility] ENDOSCOPY;  Service: Endoscopy;  Laterality: N/A;  . COLONOSCOPY WITH PROPOFOL N/A 12/16/2016   Procedure: COLONOSCOPY WITH PROPOFOL;  Surgeon: Wyline Mood, MD;   Location: [facility] ENDOSCOPY;  Service: Gastroenterology;  Laterality: N/A;  . DIAGNOSTIC LAPAROSCOPY    . DILATION AND CURETTAGE OF UTERUS    . ESOPHAGOGASTRODUODENOSCOPY (EGD) WITH PROPOFOL N/A 12/21/2015   Procedure: ESOPHAGOGASTRODUODENOSCOPY (EGD) WITH PROPOFOL;  Surgeon: Wyline Mood, MD;  Location: [facility] ENDOSCOPY;  Service: Endoscopy;  Laterality: N/A;  . ESOPHAGOGASTRODUODENOSCOPY (EGD) WITH PROPOFOL N/A 01/02/2016   Procedure: ESOPHAGOGASTRODUODENOSCOPY (EGD) WITH PROPOFOL;  Surgeon: Midge Minium, MD;  Location: [facility] ENDOSCOPY;  Service: Endoscopy;  Laterality: N/A;  . EXCISION MORTON'S NEUROMA Right   . FINGER ARTHROPLASTY Left 08/21/2015   Procedure: LEFT THUMB  ARTHROPLASTY ([facility]);  Surgeon: Deeann Saint, MD;  Location: [facility] ORS;  Service: Orthopedics;  Laterality: Left;  . LOWER EXTREMITY ANGIOGRAPHY Bilateral 04/25/2020   Procedure: LOWER EXTREMITY ANGIOGRAPHY;  Surgeon: Renford Dills, MD;  Location: [facility] INVASIVE CV LAB;  Service: Cardiovascular;  Laterality: Bilateral;  . morton neuromas removal right foot    . TUBAL LIGATION    . TUMMY TUCK  2014    Family History  Problem Relation Age of Onset  . Breast cancer Maternal Aunt   . Stroke Mother   . Heart disease Mother   . Cancer Brother        LUNG  . Heart disease Father     Allergies  Allergen Reactions  . Latex Anaphylaxis  . Ergotamine-Caffeine Hives    Body rash  . Other Diarrhea and Nausea And Vomiting     AVOCADO   . Pineapple Other (See Comments)    MOUTH SORES  . Soy Allergy Hives  . Codeine     Upset stomach  . Ergot Alkaloids Rash and Hives    CBC Latest Ref Rng & Units 01/05/2020 01/31/2018 09/23/2017  WBC 4.0 - 10.5 K/uL 9.4 7.6 9.0  Hemoglobin 12.0 - 15.0 g/dL 22.2 97.9 89.2  Hematocrit 36.0 - 46.0 % 42.5 38.8 43.2  Platelets 150 - 400 K/uL 500(H) 428(H) 419      CMP     Component Value Date/Time   NA 132 (L) 04/24/2020 1205   NA 134 (L) 01/30/2013 1220   K 4.2 04/24/2020 1205   K 3.8  01/30/2013 1220   CL 92 (L) 04/24/2020 1205   CL 101 01/30/2013 1220   CO2 26 01/05/2020 1105   CO2 30 01/30/2013 1220   GLUCOSE 94 04/24/2020 1205   GLUCOSE 107 (H) 01/05/2020 1105  GLUCOSE 93 01/30/2013 1220   BUN 13 04/25/2020 0729   BUN 9 04/24/2020 1205   BUN 13 01/30/2013 1220   CREATININE 0.67 04/25/2020 0729   CREATININE 0.99 01/30/2013 1220   CALCIUM 9.9 04/24/2020 1205   CALCIUM 9.5 01/30/2013 1220   PROT 6.9 04/24/2020 1205   PROT 7.5 01/30/2013 1220   ALBUMIN 4.7 04/24/2020 1205   ALBUMIN 4.0 01/30/2013 1220   AST 26 04/24/2020 1205   AST 38 (H) 01/30/2013 1220   ALT 34 01/05/2020 1105   ALT 32 01/30/2013 1220   ALKPHOS 94 04/24/2020 1205   ALKPHOS 126 (H) 01/30/2013 1220   BILITOT 0.3 04/24/2020 1205   BILITOT 0.4 01/30/2013 1220   GFRNONAA >60 04/25/2020 0729   GFRNONAA >60 01/30/2013 1220   GFRAA >60 01/31/2018 1241   GFRAA >60 01/30/2013 1220     VAS US ABI WITH/WO TBI  Result Date: 04/20/2020 LOWER EXTREMITY DOPPLER STUDY Indications: Claudication, and Rt CIA Stenosis on CT.  Performing Technologist: Debbe BalesSolomon Mcclary RVS  Examination Guidelines: A complete evaluation includes at minimum, Doppler waveform signals and systolic blood pressure reading at the level of bilateral brachial, anterior tibial, and posterior tibial arteries, when vessel segments are accessible. Bilateral testing is considered an integral part of a complete examination. Photoelectric Plethysmograph (PPG) waveforms and toe systolic pressure readings are included as required and additional duplex testing as needed. Limited examinations for reoccurring indications may be performed as noted.  ABI Findings: +---------+------------------+-----+--------+--------+ Right    Rt Pressure (mmHg)IndexWaveformComment  +---------+------------------+-----+--------+--------+ Brachial 159                                     +---------+------------------+-----+--------+--------+ ATA      118                0.74 biphasic         +---------+------------------+-----+--------+--------+ PTA      134               0.84 biphasic         +---------+------------------+-----+--------+--------+ Great Toe126               0.79 Normal           +---------+------------------+-----+--------+--------+ +---------+------------------+-----+---------+-------+ Left     Lt Pressure (mmHg)IndexWaveform Comment +---------+------------------+-----+---------+-------+ Brachial 152                                     +---------+------------------+-----+---------+-------+ ATA      150               0.94 triphasic        +---------+------------------+-----+---------+-------+ PTA      175               1.10 triphasic        +---------+------------------+-----+---------+-------+ Great Toe150               0.94 Normal           +---------+------------------+-----+---------+-------+ +-------+-----------+-----------+------------+------------+ ABI/TBIToday's ABIToday's TBIPrevious ABIPrevious TBI +-------+-----------+-----------+------------+------------+ Right  .84        .79                                 +-------+-----------+-----------+------------+------------+ Left   1.10       .94                                 +-------+-----------+-----------+------------+------------+  Summary: Right: Resting right ankle-brachial index indicates mild right lower extremity arterial disease. The right toe-brachial index is normal. Left: Resting left ankle-brachial index is within normal range. No evidence of significant left lower extremity arterial disease. The left toe-brachial index is normal.  *See table(s) above for measurements and observations.  Electronically signed by [name] MD on 04/20/2020 at 5:03:41 PM.   Final        Assessment & Plan:   1. Atherosclerosis of native arteries of extremities with intermittent claudication, bilateral legs (HCC) Recommend:  The  patient is status post successful angiogram with intervention.  The patient reports that the claudication symptoms and leg pain is essentially gone.   The patient denies lifestyle limiting changes at this point in time.  No further invasive studies, angiography or surgery at this time The patient should continue walking and begin a more formal exercise program.  The patient should continue antiplatelet therapy and aggressive treatment of the lipid abnormalities  The patient should continue wearing graduated compression socks 10-15 mmHg strength to control the mild edema.  Patient should undergo noninvasive studies as ordered. The patient will follow up with me after the studies.    2. Benign essential hypertension Continue antihypertensive medications as already ordered, these medications have been reviewed and there are no changes at this time.   3. 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  This accounts for patient's remaining hip discomfort.   Current Outpatient Medications on File Prior to Visit  Medication Sig Dispense Refill  . acetaminophen (TYLENOL) 500 MG tablet Take 1,000 mg by mouth every 6 (six) hours as needed for moderate pain, mild pain or headache.    . albuterol (VENTOLIN HFA) 108 (90 Base) MCG/ACT inhaler Inhale 2 puffs into the lungs every 6 (six) hours as needed for shortness of breath or wheezing.    . Ascorbic Acid (VITAMIN C PO) Take 1 tablet by mouth daily.    Marland Kitchen aspirin 81 MG EC tablet Take 81 mg by mouth daily.    Marland Kitchen atorvastatin (LIPITOR) 10 MG tablet Take 1 tablet (10 mg total) by mouth daily. 30 tablet 3  . BIOTIN PO Take 1 Dose by mouth daily. Liquid    . bismuth subsalicylate (PEPTO BISMOL) 262 MG chewable tablet Chew 524 mg by mouth daily as needed for indigestion.    . Cholecalciferol (DIALYVITE VITAMIN D 5000) 125 MCG (5000 UT) capsule Take 5,000 Units by mouth  daily.    . clopidogrel (PLAVIX) 75 MG tablet Take 1 tablet (75 mg total) by mouth daily. 30 tablet 4  . fexofenadine (ALLEGRA) 180 MG tablet Take 180 mg by mouth daily.    . fluticasone (FLONASE) 50 MCG/ACT nasal spray Place 1 spray into both nostrils daily as needed for allergies or rhinitis.    Marland Kitchen gabapentin (NEURONTIN) 300 MG capsule Take 300 mg by mouth daily as needed for pain.    Marland Kitchen ipratropium-albuterol (DUONEB) 0.5-2.5 (3) MG/3ML SOLN Inhale 3 mLs into the lungs every 6 (six) hours as needed (shortness of breath).    Marland Kitchen lisinopril-hydrochlorothiazide (ZESTORETIC) 20-25 MG tablet Take 1 tablet by mouth every evening.    . meloxicam (MOBIC) 7.5 MG tablet Take 7.5 mg by mouth daily.    . metoprolol succinate (TOPROL-XL) 25 MG 24 hr tablet Take 25 mg by mouth every evening.    . Multiple Vitamin (MULTI-VITAMIN) tablet Take 1 tablet by  mouth daily.    . naphazoline-pheniramine (NAPHCON-A) 0.025-0.3 % ophthalmic solution Place 1 drop into both eyes 4 (four) times daily as needed for eye irritation or allergies.    Marland Kitchen OVER THE COUNTER MEDICATION Take 1 tablet by mouth daily. Gut alive otc supplement    . Polyethyl Glycol-Propyl Glycol (SYSTANE) 0.4-0.3 % SOLN Place 1 drop into both eyes daily as needed (dry eyes).    . Probiotic Product (PROBIOTIC PO) Take 1 capsule by mouth daily.    Marland Kitchen tiZANidine (ZANAFLEX) 4 MG tablet Take 4 mg by mouth daily as needed for muscle spasms.    . traMADol HCl 100 MG TABS Take 50 mg by mouth every 6 (six) hours as needed (pain).     No current facility-administered medications on file prior to visit.    There are no Patient Instructions on file for this visit. No follow-ups on file.   Georgiana Spinner, NP

## 2020-06-02 ENCOUNTER — Other Ambulatory Visit (INDEPENDENT_AMBULATORY_CARE_PROVIDER_SITE_OTHER): Payer: Self-pay | Admitting: Nurse Practitioner

## 2020-06-02 DIAGNOSIS — M79606 Pain in leg, unspecified: Secondary | ICD-10-CM

## 2020-06-02 DIAGNOSIS — Z95820 Peripheral vascular angioplasty status with implants and grafts: Secondary | ICD-10-CM

## 2020-06-05 ENCOUNTER — Other Ambulatory Visit: Payer: Self-pay

## 2020-06-05 ENCOUNTER — Ambulatory Visit (INDEPENDENT_AMBULATORY_CARE_PROVIDER_SITE_OTHER): Payer: 59

## 2020-06-05 DIAGNOSIS — M79606 Pain in leg, unspecified: Secondary | ICD-10-CM

## 2020-06-05 DIAGNOSIS — Z95820 Peripheral vascular angioplasty status with implants and grafts: Secondary | ICD-10-CM

## 2020-07-03 ENCOUNTER — Ambulatory Visit
Admission: RE | Admit: 2020-07-03 | Discharge: 2020-07-03 | Disposition: A | Discharge status: Home / Self Care | Payer: 59 | Source: Ambulatory Visit | Attending: Physician Assistant | Admitting: Physician Assistant

## 2020-07-03 ENCOUNTER — Other Ambulatory Visit: Payer: Self-pay | Admitting: Physician Assistant

## 2020-07-03 DIAGNOSIS — M549 Dorsalgia, unspecified: Secondary | ICD-10-CM

## 2020-07-10 ENCOUNTER — Other Ambulatory Visit: Payer: Self-pay

## 2020-07-10 ENCOUNTER — Encounter: Payer: Self-pay | Admitting: Intensive Care

## 2020-07-10 ENCOUNTER — Emergency Department
Admission: EM | Admit: 2020-07-10 | Discharge: 2020-07-10 | Disposition: A | Discharge status: Home / Self Care | Payer: 59 | Attending: Emergency Medicine | Admitting: Emergency Medicine

## 2020-07-10 ENCOUNTER — Emergency Department: Payer: 59

## 2020-07-10 DIAGNOSIS — Z87891 Personal history of nicotine dependence: Secondary | ICD-10-CM | POA: Insufficient documentation

## 2020-07-10 DIAGNOSIS — J45909 Unspecified asthma, uncomplicated: Secondary | ICD-10-CM | POA: Diagnosis not present

## 2020-07-10 DIAGNOSIS — Z91040 Latex allergy status: Secondary | ICD-10-CM | POA: Insufficient documentation

## 2020-07-10 DIAGNOSIS — Z96692 Finger-joint replacement of left hand: Secondary | ICD-10-CM | POA: Diagnosis not present

## 2020-07-10 DIAGNOSIS — R61 Generalized hyperhidrosis: Secondary | ICD-10-CM | POA: Insufficient documentation

## 2020-07-10 DIAGNOSIS — Z7982 Long term (current) use of aspirin: Secondary | ICD-10-CM | POA: Diagnosis not present

## 2020-07-10 DIAGNOSIS — I10 Essential (primary) hypertension: Secondary | ICD-10-CM | POA: Diagnosis not present

## 2020-07-10 DIAGNOSIS — Z20822 Contact with and (suspected) exposure to covid-19: Secondary | ICD-10-CM | POA: Diagnosis not present

## 2020-07-10 DIAGNOSIS — E039 Hypothyroidism, unspecified: Secondary | ICD-10-CM | POA: Insufficient documentation

## 2020-07-10 DIAGNOSIS — R531 Weakness: Secondary | ICD-10-CM | POA: Diagnosis not present

## 2020-07-10 DIAGNOSIS — Z79899 Other long term (current) drug therapy: Secondary | ICD-10-CM | POA: Diagnosis not present

## 2020-07-10 DIAGNOSIS — R002 Palpitations: Secondary | ICD-10-CM | POA: Diagnosis not present

## 2020-07-10 DIAGNOSIS — Z7951 Long term (current) use of inhaled steroids: Secondary | ICD-10-CM | POA: Insufficient documentation

## 2020-07-10 DIAGNOSIS — Z955 Presence of coronary angioplasty implant and graft: Secondary | ICD-10-CM | POA: Insufficient documentation

## 2020-07-10 DIAGNOSIS — I2510 Atherosclerotic heart disease of native coronary artery without angina pectoris: Secondary | ICD-10-CM | POA: Insufficient documentation

## 2020-07-10 LAB — RESP PANEL BY RT-PCR (FLU A&B, COVID) ARPGX2
Influenza A by PCR: NEGATIVE
Influenza B by PCR: NEGATIVE
SARS Coronavirus 2 by RT PCR: NEGATIVE

## 2020-07-10 LAB — HEPATIC FUNCTION PANEL
ALT: 29 U/L (ref 0–44)
AST: 38 U/L (ref 15–41)
Albumin: 4.4 g/dL (ref 3.5–5.0)
Alkaline Phosphatase: 94 U/L (ref 38–126)
Bilirubin, Direct: <0.1 mg/dL (ref 0.0–0.2)
Total Bilirubin: 0.7 mg/dL (ref 0.3–1.2)
Total Protein: 7.4 g/dL (ref 6.5–8.1)

## 2020-07-10 LAB — CBC
HCT: 41.3 % (ref 36.0–46.0)
Hemoglobin: 14.3 g/dL (ref 12.0–15.0)
MCH: 32.4 pg (ref 26.0–34.0)
MCHC: 34.6 g/dL (ref 30.0–36.0)
MCV: 93.4 fL (ref 80.0–100.0)
Platelets: 551 10*3/uL — ABNORMAL HIGH (ref 150–400)
RBC: 4.42 MIL/uL (ref 3.87–5.11)
RDW: 11.5 % (ref 11.5–15.5)
WBC: 9.4 10*3/uL (ref 4.0–10.5)
nRBC: 0 % (ref 0.0–0.2)

## 2020-07-10 LAB — BASIC METABOLIC PANEL
Anion gap: 10 (ref 5–15)
BUN: 12 mg/dL (ref 8–23)
CO2: 28 mmol/L (ref 22–32)
Calcium: 9.2 mg/dL (ref 8.9–10.3)
Chloride: 95 mmol/L — ABNORMAL LOW (ref 98–111)
Creatinine, Ser: 0.7 mg/dL (ref 0.44–1.00)
GFR, Estimated: >60 mL/min (ref >60)
Glucose, Bld: 113 mg/dL — ABNORMAL HIGH (ref 70–99)
Potassium: 3.6 mmol/L (ref 3.5–5.1)
Sodium: 133 mmol/L — ABNORMAL LOW (ref 135–145)

## 2020-07-10 LAB — TROPONIN I (HIGH SENSITIVITY)
Troponin I (High Sensitivity): 11 ng/L (ref <18)
Troponin I (High Sensitivity): 12 ng/L (ref <18)

## 2020-07-10 LAB — TSH: TSH: 1.541 u[IU]/mL (ref 0.350–4.500)

## 2020-07-10 LAB — T4, FREE: Free T4: 0.79 ng/dL (ref 0.61–1.12)

## 2020-07-10 LAB — LIPASE, BLOOD: Lipase: 36 U/L (ref 11–51)

## 2020-07-10 MED ORDER — SODIUM CHLORIDE 0.9 % IV BOLUS
1000.0000 mL | Freq: Once | INTRAVENOUS | Status: AC
  Administered 2020-07-10: 1000 mL via INTRAVENOUS

## 2020-07-10 MED ORDER — ONDANSETRON HCL 4 MG/2ML IJ SOLN
4.0000 mg | Freq: Once | INTRAMUSCULAR | Status: AC
  Administered 2020-07-10: 4 mg via INTRAVENOUS
  Filled 2020-07-10: qty 2

## 2020-07-10 MED ORDER — ACETAMINOPHEN 500 MG PO TABS
1000.0000 mg | ORAL_TABLET | Freq: Once | ORAL | Status: AC
  Administered 2020-07-10: 1000 mg via ORAL
  Filled 2020-07-10: qty 2

## 2020-07-10 NOTE — ED Triage Notes (Signed)
Patient c/o heart palpitations and diaphoresis. Sent by PCP. ALso reports chronic left leg pain. Denies chest pain

## 2020-07-10 NOTE — ED Provider Notes (Signed)
[facility] Emergency Department Provider Note  ____________________________________________   Event Date/Time   First MD Initiated Contact with Patient 07/10/20 1201     (approximate)  I have reviewed the triage vital signs and the nursing notes.   HISTORY  Chief Complaint Palpitations    HPI Evelyn Simon is a 65 y.o. female with hypertension, hypothyroidism who comes in with palpitations and diaphoresis.  Patient reports having a history of palpitations about 3 to 5 years ago where she had to see cardiology and had a Holter monitor that was negative.  She states that she really had not been have any issues until the past few days however then today she started feeling nauseous, having the palpitations and feeling sweaty all over.  This has been constant, nothing makes it better, nothing makes it worse.  She reports some chronic shortness of breath from asthma but denies anything worsening than normal.  Denies abdominal pain.  She reports some chronic leg pain on the left which she states that she is had an ultrasound which I did confirm that I reviewed her records and this pain has been similar since then.  She is also had stents placed in her bilateral legs.  She denies any falls, hitting her head, headaches.            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 Pain) 04/24/2020   Chronic knee pain (5th area of Pain) (Bilateral) 04/24/2020   Cervicalgia 04/24/2020   Chronic neck pain (6th area of Pain) 04/24/2020    Chronic shoulder pain (7th area of Pain) (Bilateral) 04/24/2020   Chronic elbow pain (8th area of Pain) (Bilateral) 04/24/2020   Chronic upper extremity pain (Bilateral) 04/24/2020   Chronic pain syndrome 04/23/2020   Pharmacologic therapy 04/23/2020   Disorder of skeletal system 04/23/2020   Problems influencing health status 04/23/2020   Atherosclerosis 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 (HCC) 04/20/2020   Atherosclerosis of native arteries of extremity with rest pain (HCC) 04/20/2020   Osteoarthritis 03/05/2020   Interdigital neuroma of left foot 11/30/2019   Primary osteoarthritis of left knee 02/09/2018   Greater trochanteric bursitis of left hip 01/27/2018   Carotid artery disease (HCC) 09/09/2017   Mastalgia 11/15/2016   Weight gain 11/15/2016   Diverticulitis 10/29/2016   Healthcare maintenance 08/15/2016   Acquired hypothyroidism 06/20/2016   Trigger finger of left thumb 06/17/2016   Coronary artery disease involving native coronary artery of native heart without angina pectoris 05/21/2016   Benign essential hypertension 04/19/2016   Heart palpitations 04/19/2016   Dyspepsia    Diarrhea    Diverticulosis of large intestine without diverticulitis    Dupuytren's contracture 11/16/2015   Arthritis of hand 07/25/2015   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   Depressive disorder 08/23/2013   Sprain and strain of cruciate ligament of knee 08/23/2013   Symptomatic menopausal or female climacteric states 07/22/2013   Asthma 07/12/2013   Diverticulosis of colon 07/12/2013   Generalized  osteoarthritis of multiple sites 07/12/2013   Other and unspecified hyperlipidemia 07/12/2013   Patellar tendonitis 09/28/2010   Lump of skin 09/28/2010   Joint pain, knee 09/28/2010   Abnormal finding on thyroid function test 08/08/2010   Allergic rhinitis 07/11/2010   Chest discomfort 07/11/2010   Costochondritis 07/11/2010   Esophageal reflux 07/11/2010    Menopausal and female climacteric states 07/11/2010   Peripheral vascular disease (HCC) 07/11/2010    Past Surgical History:  Procedure Laterality Date   CESAREAN SECTION     X3   COLONOSCOPY WITH PROPOFOL N/A 12/21/2015   Procedure: COLONOSCOPY WITH PROPOFOL;  Surgeon: Wyline Mood, MD;  Location: [facility] ENDOSCOPY;  Service: Endoscopy;  Laterality: N/A;   COLONOSCOPY WITH PROPOFOL N/A 12/16/2016   Procedure: COLONOSCOPY WITH PROPOFOL;  Surgeon: Wyline Mood, MD;  Location: [facility] ENDOSCOPY;  Service: Gastroenterology;  Laterality: N/A;   DIAGNOSTIC LAPAROSCOPY     DILATION AND CURETTAGE OF UTERUS     ESOPHAGOGASTRODUODENOSCOPY (EGD) WITH PROPOFOL N/A 12/21/2015   Procedure: ESOPHAGOGASTRODUODENOSCOPY (EGD) WITH PROPOFOL;  Surgeon: Wyline Mood, MD;  Location: [facility] ENDOSCOPY;  Service: Endoscopy;  Laterality: N/A;   ESOPHAGOGASTRODUODENOSCOPY (EGD) WITH PROPOFOL N/A 01/02/2016   Procedure: ESOPHAGOGASTRODUODENOSCOPY (EGD) WITH PROPOFOL;  Surgeon: Midge Minium, MD;  Location: [facility] ENDOSCOPY;  Service: Endoscopy;  Laterality: N/A;   EXCISION MORTON'S NEUROMA Right    FINGER ARTHROPLASTY Left 08/21/2015   Procedure: LEFT THUMB  ARTHROPLASTY (CMC);  Surgeon: Deeann Saint, MD;  Location: [facility] ORS;  Service: Orthopedics;  Laterality: Left;   LOWER EXTREMITY ANGIOGRAPHY Bilateral 04/25/2020   Procedure: LOWER EXTREMITY ANGIOGRAPHY;  Surgeon: Renford Dills, MD;  Location: [facility] INVASIVE CV LAB;  Service: Cardiovascular;  Laterality: Bilateral;   morton neuromas removal right foot     TUBAL LIGATION     TUMMY TUCK  2014    Prior to Admission medications   Medication Sig Start Date End Date Taking? Authorizing Provider  acetaminophen (TYLENOL) 500 MG tablet Take 1,000 mg by mouth every 6 (six) hours as needed for moderate pain, mild pain or headache.    [provider]  albuterol (VENTOLIN HFA) 108 (90 Base) MCG/ACT inhaler Inhale 2 puffs into the lungs every 6 (six) hours as needed for shortness  of breath or wheezing. 12/10/17   [provider]  Ascorbic Acid (VITAMIN C PO) Take 1 tablet by mouth daily.    [provider]  aspirin 81 MG EC tablet Take 81 mg by mouth daily.    [provider]  atorvastatin (LIPITOR) 10 MG tablet Take 1 tablet (10 mg total) by mouth daily. 04/25/20   Schnier, Latina Craver, MD  BIOTIN PO Take 1 Dose by mouth daily. Liquid    [provider]  bismuth subsalicylate (PEPTO BISMOL) 262 MG chewable tablet Chew 524 mg by mouth daily as needed for indigestion.    [provider]  Cholecalciferol (DIALYVITE VITAMIN D 5000) 125 MCG (5000 UT) capsule Take 5,000 Units by mouth daily.    [provider]  clopidogrel (PLAVIX) 75 MG tablet Take 1 tablet (75 mg total) by mouth daily. 04/26/20   Schnier, Latina Craver, MD  fexofenadine (ALLEGRA) 180 MG tablet Take 180 mg by mouth daily.    [provider]  fluticasone (FLONASE) 50 MCG/ACT nasal spray Place 1 spray into both nostrils daily as needed for allergies or rhinitis.    [provider]  gabapentin (NEURONTIN) 300 MG capsule Take 300 mg by mouth daily as needed for pain. 08/18/19  [provider]  ipratropium-albuterol (DUONEB) 0.5-2.5 (3) MG/3ML SOLN Inhale 3 mLs into the lungs every 6 (six) hours as needed (shortness of breath). 03/03/20   [provider]  lisinopril-hydrochlorothiazide (ZESTORETIC) 20-25 MG tablet Take 1 tablet by mouth every evening. 08/30/19   [provider]  meloxicam (MOBIC) 7.5 MG tablet Take 7.5 mg by mouth daily.    [provider]  metoprolol succinate (TOPROL-XL) 25 MG 24 hr tablet Take 25 mg by mouth every evening. 08/30/19   [provider]  Multiple Vitamin (MULTI-VITAMIN) tablet Take 1 tablet by mouth daily.    [provider]  naphazoline-pheniramine (NAPHCON-A) 0.025-0.3 % ophthalmic solution Place 1 drop into both eyes 4 (four) times daily as needed for eye irritation or  allergies.    [provider]  OVER THE COUNTER MEDICATION Take 1 tablet by mouth daily. Gut alive otc supplement    [provider]  Polyethyl Glycol-Propyl Glycol (SYSTANE) 0.4-0.3 % SOLN Place 1 drop into both eyes daily as needed (dry eyes).    [provider]  Probiotic Product (PROBIOTIC PO) Take 1 capsule by mouth daily.    [provider]  tiZANidine (ZANAFLEX) 4 MG tablet Take 4 mg by mouth daily as needed for muscle spasms.    [provider]  traMADol HCl 100 MG TABS Take 50 mg by mouth every 6 (six) hours as needed (pain).    [provider]    Allergies Latex, Ergotamine-caffeine, Other, Pineapple, Soy allergy, Codeine, and Ergot alkaloids  Family History  Problem Relation Age of Onset   Breast cancer Maternal Aunt    Stroke Mother    Heart disease Mother    Cancer Brother        LUNG   Heart disease Father     Social History Social History   Tobacco Use   Smoking status: Former    Pack years: 0.00    Types: Cigarettes   Smokeless tobacco: Never  Vaping Use   Vaping Use: Never used  Substance Use Topics   Alcohol use: Yes    Alcohol/week: 14.0 standard drinks    Types: 14 Glasses of wine per week   Drug use: Yes    Comment: prescribed tramadol      Review of Systems Constitutional: No fever/chills, diaphoresis Eyes: No visual changes. ENT: No sore throat. Cardiovascular: Denies chest pain.  Palpitations Respiratory: Denies shortness of breath. Gastrointestinal: No abdominal pain.  Positive nausea, no vomiting.  No diarrhea.  No constipation. Genitourinary: Negative for dysuria. Musculoskeletal: Negative for back pain. Skin: Negative for rash. Neurological: Negative for headaches, focal weakness or numbness. All other ROS negative ____________________________________________   PHYSICAL EXAM:  VITAL SIGNS: ED Triage Vitals  Enc Vitals Group     BP 07/10/20 1131 (!) 177/91     Pulse Rate  07/10/20 1131 86     Resp 07/10/20 1131 18     Temp 07/10/20 1131 98.3 F (36.8 C)     Temp Source 07/10/20 1131 Oral     SpO2 07/10/20 1131 98 %     Weight 07/10/20 1128 165 lb (74.8 kg)     Height 07/10/20 1128 5\' 2"  (1.575 m)     Head Circumference --      Peak Flow --      Pain Score 07/10/20 1128 5     Pain Loc --      Pain Edu? --      Excl. in GC? --  Constitutional: Alert and oriented. Well appearing and in no acute distress. Eyes: Conjunctivae are normal. EOMI. Head: Atraumatic. Nose: No congestion/rhinnorhea. Mouth/Throat: Mucous membranes are moist.   Neck: No stridor. Trachea Midline. FROM Cardiovascular: Normal rate, regular rhythm. Grossly normal heart sounds.  Good peripheral circulation. Respiratory: Normal respiratory effort.  No retractions. Lungs CTAB. Gastrointestinal: Soft and nontender. No distention. No abdominal bruits.  Musculoskeletal: No lower extremity tenderness nor edema.  No joint effusions.  No calf pain bilaterally.  No swelling noted.  2+ distal pulses Neurologic:  Normal speech and language. No gross focal neurologic deficits are appreciated.  Cranial nerves II to XII are intact.  Equal strength in arms and legs.  Finger-to-nose intact bilaterally Skin:  Skin is warm, dry and intact. No rash noted. Psychiatric: Mood and affect are normal. Speech and behavior are normal. GU: Deferred   ____________________________________________   LABS (all labs ordered are listed, but only abnormal results are displayed)  Labs Reviewed  BASIC METABOLIC PANEL - Abnormal; Notable for the following components:      Result Value   Sodium 133 (*)    Chloride 95 (*)    Glucose, Bld 113 (*)    All other components within normal limits  CBC - Abnormal; Notable for the following components:   Platelets 551 (*)    All other components within normal limits  TROPONIN I (HIGH SENSITIVITY)   ____________________________________________   ED ECG REPORT I,  Concha Se, the attending physician, personally viewed and interpreted this ECG.  Normal sinus rate of 86, no ST elevation, no T wave inversions, normal intervals ____________________________________________  RADIOLOGY Vela Prose, personally viewed and evaluated these images (plain radiographs) as part of my medical decision making, as well as reviewing the written report by the radiologist.  ED MD interpretation: No pneumonia  Official radiology report(s): DG Chest 2 View  Result Date: 07/10/2020 CLINICAL DATA:  Palpitations, dizziness, and diaphoresis beginning this morning. EXAM: CHEST - 2 VIEW COMPARISON:  01/05/2020 and 01/20/2015 FINDINGS: The heart size and mediastinal contours are within normal limits. Mild scarring again seen at left lung base. Prominent epicardial fat pad again seen in the right cardiophrenic angle. No evidence of pulmonary infiltrate or edema. No evidence of pleural effusion. The visualized skeletal structures are unremarkable. IMPRESSION: Stable exam.  No active cardiopulmonary disease. Electronically Signed   By: Danae Orleans M.D.   On: 07/10/2020 12:09    ____________________________________________   PROCEDURES  Procedure(s) performed (including Critical Care):  Procedures   ____________________________________________   INITIAL IMPRESSION / ASSESSMENT AND PLAN / ED COURSE  Evelyn Simon was evaluated in Emergency Department on 07/10/2020 for the symptoms described in the history of present illness. She was evaluated in the context of the global COVID-19 pandemic, which necessitated consideration that the patient might be at risk for infection with the SARS-CoV-2 virus that causes COVID-19. Institutional protocols and algorithms that pertain to the evaluation of patients at risk for COVID-19 are in a state of rapid change based on information released by regulatory bodies including the CDC and federal and state organizations. These policies and  algorithms were followed during the patient's care in the ED.    Patient is a well-appearing 65 year old who comes in with palpitations, sweating, nausea.  Will get labs to evaluate for Electra abnormalities, AKI, UTI, thyroid dysfunction, ACS.  Chest x-ray to evaluate for any pneumonia.  Her abdomen is soft and nontender.  Her neuro exam is intact with good finger-nose.  Does not seem to be like a primary neurological process.  Will get COVID swab.  2:11 PM reevaluated patient.  She states her symptoms are not much improved but she just wants to make sure she is not having a heart attack and then would like to go home.  She states is really hard for her to explain what she is feeling but it is more like a fuzziness in her head.  Denies any headache or nausea or vomiting.  We discussed CT imaging and elected to hold off at this time.  She is going to continue to monitor her symptoms at home and return if symptoms are worsening  Cardiac markers are negative x2, COVID is negative.  Labs are otherwise reassuring except for some chronically elevated platelets and some slightly low sodium and chloride which I given her fluid for.  This time patient feels comfortable with discharge home and will continue to monitor her symptoms at home.  I discussed the provisional nature of ED diagnosis, the treatment so far, the ongoing plan of care, follow up appointments and return precautions with the patient and any family or support people present. They expressed understanding and agreed with the plan, discharged home.          ____________________________________________   FINAL CLINICAL IMPRESSION(S) / ED DIAGNOSES   Final diagnoses:  Palpitations  Weakness      MEDICATIONS GIVEN DURING THIS VISIT:  Medications  ondansetron (ZOFRAN) injection 4 mg (4 mg Intravenous Given 07/10/20 1232)  sodium chloride 0.9 % bolus 1,000 mL (1,000 mLs Intravenous New Bag/Given 07/10/20 1232)  acetaminophen  (TYLENOL) tablet 1,000 mg (1,000 mg Oral Given 07/10/20 1232)     ED Discharge Orders     None        Note:  This document was prepared using Dragon voice recognition software and may include unintentional dictation errors.    Concha Se, MD 07/10/20 [phone]

## 2020-07-10 NOTE — Discharge Instructions (Addendum)
Your work-up was reassuring although your sodium and chloride were slightly low which we have given you fluid for.  Continue to monitor your symptoms at home and try to see there is anything that make them worse.  This could just be a viral illness.  SHe can follow-up with cardiology if you do get palpitations to discuss repeat Holter monitor.  Return to the ER if your symptoms are changing or getting worse.

## 2020-07-10 NOTE — ED Notes (Signed)
Pt states that she woke up this am with dizziness, sob, and nausea, pt reports some palpitations as well but were very and didn't correlate with her dizziness, pt reports that she cont to have sob and cont to feel dizzy, states that her vision was blurred bilat this am as well, pt reports that she cont to have left leg and groin pain and reports that she had bilat femoral stents placed 6 weeks ago

## 2020-08-16 ENCOUNTER — Other Ambulatory Visit (INDEPENDENT_AMBULATORY_CARE_PROVIDER_SITE_OTHER): Payer: Self-pay | Admitting: Vascular Surgery

## 2020-08-16 DIAGNOSIS — I70223 Atherosclerosis of native arteries of extremities with rest pain, bilateral legs: Secondary | ICD-10-CM

## 2020-08-16 DIAGNOSIS — Z95828 Presence of other vascular implants and grafts: Secondary | ICD-10-CM

## 2020-08-17 ENCOUNTER — Encounter (INDEPENDENT_AMBULATORY_CARE_PROVIDER_SITE_OTHER): Payer: 59

## 2020-08-17 ENCOUNTER — Ambulatory Visit (INDEPENDENT_AMBULATORY_CARE_PROVIDER_SITE_OTHER): Payer: 59 | Admitting: Vascular Surgery

## 2020-11-22 NOTE — Progress Notes (Signed)
MRN : [id number]  Evelyn Simon is a 65 y.o. (1956/01/02) female who presents with chief complaint of check legs.  History of Present Illness:   The patient returns to the office for followup and review status post angiogram with intervention.   Procedure 04/25/2020: Percutaneous transluminal angioplasty and stent placement bilateral common iliac artery; "kissing balloon" technique  The patient notes improvement in the lower extremity symptoms. No interval shortening of the patient's claudication distance or rest pain symptoms. No new ulcers or wounds have occurred since the last visit.  Her biggest complaint right now is her hip because her arthritis has gotten significantly worse.  There have been no significant changes to the patient's overall health care.  The patient denies amaurosis fugax or recent TIA symptoms. There are no recent neurological changes noted. The patient denies history of DVT, PE or superficial thrombophlebitis. The patient denies recent episodes of angina or shortness of breath.   ABI's Rt=1.10 and Lt=1.10  (previous ABI's Rt=1.02 and Lt=1.22) Duplex US of the aorta iliac arterial system shows stents are widely patent no hemodynamically significant stenoses noted.  Duplex ultrasound of the carotid arteries obtained today demonstrates a RICAis 40-50% stenosis and the LICA is less than 30% stenosis   No outpatient medications have been marked as taking for the 11/23/20 encounter (Appointment) with Gilda Crease, Latina Craver, MD.    Past Medical History:  Diagnosis Date   Arthritis    Asthma    Diverticulosis of colon    GERD (gastroesophageal reflux disease)    Headache    H/O MIGRAINES   Hypertension    Hypothyroidism     Past Surgical History:  Procedure Laterality Date   CESAREAN SECTION     X3   COLONOSCOPY WITH PROPOFOL N/A 12/21/2015   Procedure: COLONOSCOPY WITH PROPOFOL;  Surgeon: Wyline Mood, MD;  Location: [facility] ENDOSCOPY;  Service: Endoscopy;   Laterality: N/A;   COLONOSCOPY WITH PROPOFOL N/A 12/16/2016   Procedure: COLONOSCOPY WITH PROPOFOL;  Surgeon: Wyline Mood, MD;  Location: [facility] ENDOSCOPY;  Service: Gastroenterology;  Laterality: N/A;   DIAGNOSTIC LAPAROSCOPY     DILATION AND CURETTAGE OF UTERUS     ESOPHAGOGASTRODUODENOSCOPY (EGD) WITH PROPOFOL N/A 12/21/2015   Procedure: ESOPHAGOGASTRODUODENOSCOPY (EGD) WITH PROPOFOL;  Surgeon: Wyline Mood, MD;  Location: [facility] ENDOSCOPY;  Service: Endoscopy;  Laterality: N/A;   ESOPHAGOGASTRODUODENOSCOPY (EGD) WITH PROPOFOL N/A 01/02/2016   Procedure: ESOPHAGOGASTRODUODENOSCOPY (EGD) WITH PROPOFOL;  Surgeon: Midge Minium, MD;  Location: [facility] ENDOSCOPY;  Service: Endoscopy;  Laterality: N/A;   EXCISION MORTON'S NEUROMA Right    FINGER ARTHROPLASTY Left 08/21/2015   Procedure: LEFT THUMB  ARTHROPLASTY (CMC);  Surgeon: Deeann Saint, MD;  Location: [facility] ORS;  Service: Orthopedics;  Laterality: Left;   LOWER EXTREMITY ANGIOGRAPHY Bilateral 04/25/2020   Procedure: LOWER EXTREMITY ANGIOGRAPHY;  Surgeon: Renford Dills, MD;  Location: [facility] INVASIVE CV LAB;  Service: Cardiovascular;  Laterality: Bilateral;   morton neuromas removal right foot     TUBAL LIGATION     TUMMY TUCK  2014    Social History Social History   Tobacco Use   Smoking status: Former    Types: Cigarettes   Smokeless tobacco: Never  Vaping Use   Vaping Use: Never used  Substance Use Topics   Alcohol use: Yes    Alcohol/week: 14.0 standard drinks    Types: 14 Glasses of wine per week   Drug use: Yes    Comment: prescribed tramadol    Family History Family History  Problem Relation  Age of Onset   Breast cancer Maternal Aunt    Stroke Mother    Heart disease Mother    Cancer Brother        LUNG   Heart disease Father     Allergies  Allergen Reactions   Latex Anaphylaxis   Ergotamine-Caffeine Hives    Body rash   Other Diarrhea and Nausea And Vomiting     AVOCADO    Pineapple Other (See Comments)    MOUTH  SORES   Soy Allergy Hives   Codeine     Upset stomach   Ergot Alkaloids Rash and Hives     REVIEW OF SYSTEMS (Negative unless checked)  Constitutional: [] Weight loss  [] Fever  [] Chills Cardiac: [] Chest pain   [] Chest pressure   [] Palpitations   [] Shortness of breath when laying flat   [] Shortness of breath with exertion. Vascular:  [] Pain in legs with walking   [] Pain in legs at rest  [] History of DVT   [] Phlebitis   [] Swelling in legs   [] Varicose veins   [] Non-healing ulcers Pulmonary:   [] Uses home oxygen   [] Productive cough   [] Hemoptysis   [] Wheeze  [] COPD   [] Asthma Neurologic:  [] Dizziness   [] Seizures   [] History of stroke   [] History of TIA  [] Aphasia   [] Vissual changes   [] Weakness or numbness in arm   [] Weakness or numbness in leg Musculoskeletal:   [] Joint swelling   [x] Joint pain   [] Low back pain Hematologic:  [] Easy bruising  [] Easy bleeding   [] Hypercoagulable state   [] Anemic Gastrointestinal:  [] Diarrhea   [] Vomiting  [] Gastroesophageal reflux/heartburn   [] Difficulty swallowing. Genitourinary:  [] Chronic kidney disease   [] Difficult urination  [] Frequent urination   [] Blood in urine Skin:  [] Rashes   [] Ulcers  Psychological:  [] History of anxiety   []  History of major depression.  Physical Examination  There were no vitals filed for this visit. There is no height or weight on file to calculate BMI. Gen: WD/WN, NAD Head: [place]/AT, No temporalis wasting.  Ear/Nose/Throat: Hearing grossly intact, nares w/o erythema or drainage Eyes: PER, EOMI, sclera nonicteric.  Neck: Supple, no masses.  No bruit or JVD.  Pulmonary:  Good air movement, no audible wheezing, no use of accessory muscles.  Cardiac: RRR, normal S1, S2, no Murmurs. Vascular:   High-pitched right carotid bruit Vessel Right Left  Radial Palpable Palpable  Carotid Palpable Palpable  PT Palpable Palpable  DP Palpable Palpable  Gastrointestinal: soft, non-distended. No guarding/no peritoneal signs.   Musculoskeletal: M/S 5/5 throughout.  No visible deformity.  Neurologic: CN 2-12 intact. Pain and light touch intact in extremities.  Symmetrical.  Speech is fluent. Motor exam as listed above. Psychiatric: Judgment intact, Mood & affect appropriate for pt's clinical situation. Dermatologic: No rashes or ulcers noted.  No changes consistent with cellulitis.   CBC Lab Results  Component Value Date   WBC 9.4 07/10/2020   HGB 14.3 07/10/2020   HCT 41.3 07/10/2020   MCV 93.4 07/10/2020   PLT 551 (H) 07/10/2020    BMET    Component Value Date/Time   NA 133 (L) 07/10/2020 1137   NA 132 (L) 04/24/2020 1205   NA 134 (L) 01/30/2013 1220   K 3.6 07/10/2020 1137   K 3.8 01/30/2013 1220   CL 95 (L) 07/10/2020 1137   CL 101 01/30/2013 1220   CO2 28 07/10/2020 1137   CO2 30 01/30/2013 1220   GLUCOSE 113 (H) 07/10/2020 1137   GLUCOSE 93 01/30/2013  1220   BUN 12 07/10/2020 1137   BUN 9 04/24/2020 1205   BUN 13 01/30/2013 1220   CREATININE 0.70 07/10/2020 1137   CREATININE 0.99 01/30/2013 1220   CALCIUM 9.2 07/10/2020 1137   CALCIUM 9.5 01/30/2013 1220   GFRNONAA >60 07/10/2020 1137   GFRNONAA >60 01/30/2013 1220   GFRAA >60 01/31/2018 1241   GFRAA >60 01/30/2013 1220   CrCl cannot be calculated (Patient's most recent lab result is older than the maximum 21 days allowed.).  COAG No results found for: INR, PROTIME  Radiology No results found.   Assessment/Plan 1. Atherosclerosis of native artery of both lower extremities with intermittent claudication (HCC)  Recommend:  The patient has evidence of atherosclerosis of the lower extremities with claudication.  The patient does not voice lifestyle limiting changes at this point in time.  Noninvasive studies do not suggest clinically significant change.  No invasive studies, angiography or surgery at this time The patient should continue walking and begin a more formal exercise program.  The patient should continue  antiplatelet therapy and aggressive treatment of the lipid abnormalities  No changes in the patient's medications at this time  The patient should continue wearing graduated compression socks 10-15 mmHg strength to control the mild edema.   - VAS [place] ABI WITH/WO TBI; Future  2. Bilateral carotid artery stenosis Recommend:  Given the patient's asymptomatic subcritical stenosis no further invasive testing or surgery at this time.  Duplex ultrasound shows RICA 40-50% stenosis and <30% LICA stenosis.  Continue antiplatelet therapy as prescribed Continue management of CAD, HTN and Hyperlipidemia Healthy heart diet,  encouraged exercise at least 4 times per week Follow up in 12 months with duplex ultrasound and physical exam    - VAS US CAROTID; Future  3. Benign essential hypertension Continue antihypertensive medications as already ordered, these medications have been reviewed and there are no changes at this time.   4. Coronary artery disease involving native coronary artery of native heart without angina pectoris Continue cardiac and antihypertensive medications as already ordered and reviewed, no changes at this time.  Continue statin as ordered and reviewed, no changes at this time  Nitrates PRN for chest pain     [name], MD  11/22/2020 10:07 AM

## 2020-11-23 ENCOUNTER — Ambulatory Visit (INDEPENDENT_AMBULATORY_CARE_PROVIDER_SITE_OTHER): Payer: 59

## 2020-11-23 ENCOUNTER — Other Ambulatory Visit (INDEPENDENT_AMBULATORY_CARE_PROVIDER_SITE_OTHER): Payer: Self-pay | Admitting: Vascular Surgery

## 2020-11-23 ENCOUNTER — Ambulatory Visit (INDEPENDENT_AMBULATORY_CARE_PROVIDER_SITE_OTHER): Payer: 59 | Admitting: Vascular Surgery

## 2020-11-23 ENCOUNTER — Encounter (INDEPENDENT_AMBULATORY_CARE_PROVIDER_SITE_OTHER): Payer: Self-pay | Admitting: Vascular Surgery

## 2020-11-23 VITALS — BP 145/79 | HR 75 | Ht 62.0 in | Wt 161.0 lb

## 2020-11-23 DIAGNOSIS — I6523 Occlusion and stenosis of bilateral carotid arteries: Secondary | ICD-10-CM | POA: Diagnosis not present

## 2020-11-23 DIAGNOSIS — Z95828 Presence of other vascular implants and grafts: Secondary | ICD-10-CM

## 2020-11-23 DIAGNOSIS — I10 Essential (primary) hypertension: Secondary | ICD-10-CM

## 2020-11-23 DIAGNOSIS — I2510 Atherosclerotic heart disease of native coronary artery without angina pectoris: Secondary | ICD-10-CM | POA: Diagnosis not present

## 2020-11-23 DIAGNOSIS — I70213 Atherosclerosis of native arteries of extremities with intermittent claudication, bilateral legs: Secondary | ICD-10-CM | POA: Diagnosis not present

## 2020-11-23 DIAGNOSIS — I70223 Atherosclerosis of native arteries of extremities with rest pain, bilateral legs: Secondary | ICD-10-CM

## 2020-11-23 DIAGNOSIS — R0989 Other specified symptoms and signs involving the circulatory and respiratory systems: Secondary | ICD-10-CM

## 2020-11-29 ENCOUNTER — Encounter: Payer: 59 | Admitting: Occupational Therapy

## 2020-12-01 ENCOUNTER — Encounter: Payer: 59 | Admitting: Occupational Therapy

## 2020-12-04 ENCOUNTER — Ambulatory Visit: Payer: 59 | Attending: Physician Assistant | Admitting: Physical Therapy

## 2020-12-12 ENCOUNTER — Encounter: Payer: 59 | Admitting: Physical Therapy

## 2020-12-14 ENCOUNTER — Encounter: Payer: 59 | Admitting: Physical Therapy

## 2020-12-19 ENCOUNTER — Encounter: Payer: 59 | Admitting: Physical Therapy

## 2020-12-21 ENCOUNTER — Encounter: Payer: 59 | Admitting: Physical Therapy

## 2020-12-26 ENCOUNTER — Encounter: Payer: 59 | Admitting: Physical Therapy

## 2020-12-28 ENCOUNTER — Encounter: Payer: 59 | Admitting: Physical Therapy

## 2021-01-02 ENCOUNTER — Encounter: Payer: 59 | Admitting: Physical Therapy

## 2021-01-04 ENCOUNTER — Encounter: Payer: 59 | Admitting: Physical Therapy

## 2021-01-09 ENCOUNTER — Encounter: Payer: 59 | Admitting: Physical Therapy

## 2021-01-11 ENCOUNTER — Encounter: Payer: 59 | Admitting: Physical Therapy

## 2021-11-20 NOTE — Progress Notes (Deleted)
MRN : [id number]  Evelyn Simon is a 66 y.o. (07-Sep-1955) female who presents with chief complaint of check circulation.  History of Present Illness:   The patient returns to the office for followup and review status post angiogram with intervention.    Procedure 04/25/2020: Percutaneous transluminal angioplasty and stent placement bilateral common iliac artery; "kissing balloon" technique   The patient notes her walking has been stable. No interval shortening of the patient's claudication distance or rest pain symptoms. No new ulcers or wounds have occurred since the last visit.  Her biggest complaint right now is her hip because her arthritis has gotten significantly worse.   There have been no significant changes to the patient's overall health care.   The patient is also followed for carotid stenosis.  The patient denies amaurosis fugax or recent TIA symptoms. There are no recent neurological changes noted. The patient denies history of DVT, PE or superficial thrombophlebitis. The patient denies recent episodes of angina or shortness of breath.    ABI's Rt=1.10 and Lt=1.10  (previous ABI's Rt=1.02 and Lt=1.22) Duplex US of the aorta iliac arterial system shows stents are widely patent no hemodynamically significant stenoses noted.  Duplex ultrasound of the carotid arteries obtained today demonstrates a RICAis 40-50% stenosis and the LICA is less than 51% stenosis  No outpatient medications have been marked as taking for the 11/26/21 encounter (Appointment) with Delana Meyer, Dolores Lory, MD.    Past Medical History:  Diagnosis Date   Arthritis    Asthma    Diverticulosis of colon    GERD (gastroesophageal reflux disease)    Headache    H/O MIGRAINES   Hypertension    Hypothyroidism     Past Surgical History:  Procedure Laterality Date   CESAREAN SECTION     X3   COLONOSCOPY WITH PROPOFOL N/A 12/21/2015   Procedure: COLONOSCOPY WITH PROPOFOL;  Surgeon: Jonathon Bellows, MD;  Location: [facility] ENDOSCOPY;  Service: Endoscopy;  Laterality: N/A;   COLONOSCOPY WITH PROPOFOL N/A 12/16/2016   Procedure: COLONOSCOPY WITH PROPOFOL;  Surgeon: Jonathon Bellows, MD;  Location: [facility] ENDOSCOPY;  Service: Gastroenterology;  Laterality: N/A;   DIAGNOSTIC LAPAROSCOPY     DILATION AND CURETTAGE OF UTERUS     ESOPHAGOGASTRODUODENOSCOPY (EGD) WITH PROPOFOL N/A 12/21/2015   Procedure: ESOPHAGOGASTRODUODENOSCOPY (EGD) WITH PROPOFOL;  Surgeon: Jonathon Bellows, MD;  Location: [facility] ENDOSCOPY;  Service: Endoscopy;  Laterality: N/A;   ESOPHAGOGASTRODUODENOSCOPY (EGD) WITH PROPOFOL N/A 01/02/2016   Procedure: ESOPHAGOGASTRODUODENOSCOPY (EGD) WITH PROPOFOL;  Surgeon: Lucilla Lame, MD;  Location: [facility] ENDOSCOPY;  Service: Endoscopy;  Laterality: N/A;   EXCISION MORTON'S NEUROMA Right    FINGER ARTHROPLASTY Left 08/21/2015   Procedure: LEFT THUMB  ARTHROPLASTY ([place]);  Surgeon: Earnestine Leys, MD;  Location: [facility] ORS;  Service: Orthopedics;  Laterality: Left;   LOWER EXTREMITY ANGIOGRAPHY Bilateral 04/25/2020   Procedure: LOWER EXTREMITY ANGIOGRAPHY;  Surgeon: Katha Cabal, MD;  Location: [facility] CV LAB;  Service: Cardiovascular;  Laterality: Bilateral;   morton neuromas removal right foot     TUBAL LIGATION     TUMMY TUCK  2014    Social History Social History   Tobacco Use   Smoking status: Former    Types: Cigarettes   Smokeless tobacco: Never  Vaping Use   Vaping Use: Never used  Substance Use Topics   Alcohol use: Yes    Alcohol/week: 14.0 standard drinks of alcohol    Types:  14 Glasses of wine per week   Drug use: Yes    Comment: prescribed tramadol    Family History Family History  Problem Relation Age of Onset   Breast cancer Maternal Aunt    Stroke Mother    Heart disease Mother    Cancer Brother        LUNG   Heart disease Father     Allergies  Allergen Reactions   Latex Anaphylaxis   Ergotamine-Caffeine Hives    Body rash   Other Diarrhea and Nausea And  Vomiting     AVOCADO    Pineapple Other (See Comments)    MOUTH SORES   Soy Allergy Hives   Codeine     Upset stomach   Ergot Alkaloids Rash and Hives     REVIEW OF SYSTEMS (Negative unless checked)  Constitutional: [] Weight loss  [] Fever  [] Chills Cardiac: [] Chest pain   [] Chest pressure   [] Palpitations   [] Shortness of breath when laying flat   [] Shortness of breath with exertion. Vascular:  [x] Pain in legs with walking   [] Pain in legs at rest  [] History of DVT   [] Phlebitis   [] Swelling in legs   [] Varicose veins   [] Non-healing ulcers Pulmonary:   [] Uses home oxygen   [] Productive cough   [] Hemoptysis   [] Wheeze  [] COPD   [x] Asthma Neurologic:  [] Dizziness   [] Seizures   [] History of stroke   [] History of TIA  [] Aphasia   [] Vissual changes   [] Weakness or numbness in arm   [] Weakness or numbness in leg Musculoskeletal:   [] Joint swelling   [x] Joint pain   [] Low back pain Hematologic:  [] Easy bruising  [] Easy bleeding   [] Hypercoagulable state   [] Anemic Gastrointestinal:  [] Diarrhea   [] Vomiting  [x] Gastroesophageal reflux/heartburn   [] Difficulty swallowing. Genitourinary:  [] Chronic kidney disease   [] Difficult urination  [] Frequent urination   [] Blood in urine Skin:  [] Rashes   [] Ulcers  Psychological:  [] History of anxiety   []  History of major depression.  Physical Examination  There were no vitals filed for this visit. There is no height or weight on file to calculate BMI. Gen: WD/WN, NAD Head: [place]/AT, No temporalis wasting.  Ear/Nose/Throat: Hearing grossly intact, nares w/o erythema or drainage Eyes: PER, EOMI, sclera nonicteric.  Neck: Supple, no masses.  No bruit or JVD.  Pulmonary:  Good air movement, no audible wheezing, no use of accessory muscles.  Cardiac: RRR, normal S1, S2, no Murmurs. Vascular:  mild trophic changes, no open wounds Vessel Right Left  Radial Palpable Palpable  PT Not Palpable Not Palpable  DP Not Palpable Not Palpable  Gastrointestinal:  soft, non-distended. No guarding/no peritoneal signs.  Musculoskeletal: M/S 5/5 throughout.  No visible deformity.  Neurologic: CN 2-12 intact. Pain and light touch intact in extremities.  Symmetrical.  Speech is fluent. Motor exam as listed above. Psychiatric: Judgment intact, Mood & affect appropriate for pt's clinical situation. Dermatologic: No rashes or ulcers noted.  No changes consistent with cellulitis.   CBC Lab Results  Component Value Date   WBC 9.4 07/10/2020   HGB 14.3 07/10/2020   HCT 41.3 07/10/2020   MCV 93.4 07/10/2020   PLT 551 (H) 07/10/2020    BMET    Component Value Date/Time   NA 133 (L) 07/10/2020 1137   NA 132 (L) 04/24/2020 1205   NA 134 (L) 01/30/2013 1220   K 3.6 07/10/2020 1137   K 3.8 01/30/2013 1220   CL 95 (L) 07/10/2020 1137  CL 101 01/30/2013 1220   CO2 28 07/10/2020 1137   CO2 30 01/30/2013 1220   GLUCOSE 113 (H) 07/10/2020 1137   GLUCOSE 93 01/30/2013 1220   BUN 12 07/10/2020 1137   BUN 9 04/24/2020 1205   BUN 13 01/30/2013 1220   CREATININE 0.70 07/10/2020 1137   CREATININE 0.99 01/30/2013 1220   CALCIUM 9.2 07/10/2020 1137   CALCIUM 9.5 01/30/2013 1220   GFRNONAA >60 07/10/2020 1137   GFRNONAA >60 01/30/2013 1220   GFRAA >60 01/31/2018 1241   GFRAA >60 01/30/2013 1220   CrCl cannot be calculated (Patient's most recent lab result is older than the maximum 21 days allowed.).  COAG No results found for: "INR", "PROTIME"  Radiology No results found.   Assessment/Plan There are no diagnoses linked to this encounter.   [name], MD  11/20/2021 2:54 PM

## 2021-11-21 ENCOUNTER — Other Ambulatory Visit (INDEPENDENT_AMBULATORY_CARE_PROVIDER_SITE_OTHER): Payer: Self-pay | Admitting: Vascular Surgery

## 2021-11-21 DIAGNOSIS — I6523 Occlusion and stenosis of bilateral carotid arteries: Secondary | ICD-10-CM

## 2021-11-21 DIAGNOSIS — I739 Peripheral vascular disease, unspecified: Secondary | ICD-10-CM

## 2021-11-26 ENCOUNTER — Encounter (INDEPENDENT_AMBULATORY_CARE_PROVIDER_SITE_OTHER): Payer: 59

## 2021-11-26 ENCOUNTER — Ambulatory Visit (INDEPENDENT_AMBULATORY_CARE_PROVIDER_SITE_OTHER): Payer: 59 | Admitting: Vascular Surgery

## 2021-11-26 DIAGNOSIS — I6523 Occlusion and stenosis of bilateral carotid arteries: Secondary | ICD-10-CM

## 2021-11-26 DIAGNOSIS — J45909 Unspecified asthma, uncomplicated: Secondary | ICD-10-CM

## 2021-11-26 DIAGNOSIS — I10 Essential (primary) hypertension: Secondary | ICD-10-CM

## 2021-11-26 DIAGNOSIS — I70213 Atherosclerosis of native arteries of extremities with intermittent claudication, bilateral legs: Secondary | ICD-10-CM

## 2021-11-26 DIAGNOSIS — I2510 Atherosclerotic heart disease of native coronary artery without angina pectoris: Secondary | ICD-10-CM

## 2022-07-24 ENCOUNTER — Other Ambulatory Visit: Payer: Self-pay | Admitting: Internal Medicine

## 2022-07-24 DIAGNOSIS — R42 Dizziness and giddiness: Secondary | ICD-10-CM

## 2022-07-24 DIAGNOSIS — R002 Palpitations: Secondary | ICD-10-CM

## 2022-07-24 DIAGNOSIS — I200 Unstable angina: Secondary | ICD-10-CM

## 2022-07-26 ENCOUNTER — Ambulatory Visit (INDEPENDENT_AMBULATORY_CARE_PROVIDER_SITE_OTHER): Payer: Medicare Other

## 2022-07-26 DIAGNOSIS — R002 Palpitations: Secondary | ICD-10-CM

## 2022-07-26 DIAGNOSIS — I200 Unstable angina: Secondary | ICD-10-CM

## 2022-07-26 DIAGNOSIS — R42 Dizziness and giddiness: Secondary | ICD-10-CM

## 2022-07-26 MED ORDER — TECHNETIUM TC 99M SESTAMIBI GENERIC - CARDIOLITE
10.3000 | Freq: Once | INTRAVENOUS | Status: AC | PRN
  Administered 2022-07-26: 10.3 via INTRAVENOUS

## 2022-07-26 MED ORDER — TECHNETIUM TC 99M SESTAMIBI GENERIC - CARDIOLITE
34.3000 | Freq: Once | INTRAVENOUS | Status: AC | PRN
  Administered 2022-07-26: 34.3 via INTRAVENOUS

## 2022-08-02 ENCOUNTER — Ambulatory Visit (INDEPENDENT_AMBULATORY_CARE_PROVIDER_SITE_OTHER): Payer: Medicare Other | Admitting: Cardiovascular Disease

## 2022-08-02 ENCOUNTER — Ambulatory Visit (INDEPENDENT_AMBULATORY_CARE_PROVIDER_SITE_OTHER): Payer: Medicare Other

## 2022-08-02 ENCOUNTER — Encounter: Payer: Self-pay | Admitting: Cardiovascular Disease

## 2022-08-02 VITALS — BP 139/76 | HR 82 | Ht 62.0 in | Wt 171.2 lb

## 2022-08-02 DIAGNOSIS — I739 Peripheral vascular disease, unspecified: Secondary | ICD-10-CM

## 2022-08-02 DIAGNOSIS — I70213 Atherosclerosis of native arteries of extremities with intermittent claudication, bilateral legs: Secondary | ICD-10-CM

## 2022-08-02 DIAGNOSIS — I10 Essential (primary) hypertension: Secondary | ICD-10-CM

## 2022-08-02 DIAGNOSIS — I6523 Occlusion and stenosis of bilateral carotid arteries: Secondary | ICD-10-CM | POA: Diagnosis not present

## 2022-08-02 DIAGNOSIS — I259 Chronic ischemic heart disease, unspecified: Secondary | ICD-10-CM

## 2022-08-02 DIAGNOSIS — I371 Nonrheumatic pulmonary valve insufficiency: Secondary | ICD-10-CM

## 2022-08-02 DIAGNOSIS — I422 Other hypertrophic cardiomyopathy: Secondary | ICD-10-CM

## 2022-08-02 DIAGNOSIS — I361 Nonrheumatic tricuspid (valve) insufficiency: Secondary | ICD-10-CM

## 2022-08-02 DIAGNOSIS — I351 Nonrheumatic aortic (valve) insufficiency: Secondary | ICD-10-CM | POA: Diagnosis not present

## 2022-08-02 MED ORDER — ASPIRIN 81 MG PO TBEC: 81.0000 mg | DELAYED_RELEASE_TABLET | Freq: Every day | ORAL | 3 refills | Status: AC

## 2022-08-02 MED ORDER — ROSUVASTATIN CALCIUM 10 MG PO TABS: 10.0000 mg | ORAL_TABLET | Freq: Every day | ORAL | 11 refills | Status: DC

## 2022-08-02 MED ORDER — METOPROLOL SUCCINATE ER 25 MG PO TB24: 25.0000 mg | ORAL_TABLET | Freq: Every evening | ORAL | 2 refills | Status: DC

## 2022-08-02 MED ORDER — CLOPIDOGREL BISULFATE 75 MG PO TABS: 75.0000 mg | ORAL_TABLET | Freq: Every day | ORAL | 4 refills | Status: DC

## 2022-08-02 NOTE — Progress Notes (Addendum)
Cardiology Office Note   Date:  08/02/2022   ID:  Evelyn Simon, DOB 01-Sep-1955, MRN [id number]  PCP:  Miki Kins, FNP  Cardiologist:  Adrian Blackwater, MD      History of Present Illness: Evelyn Simon is a 67 y.o. female who presents for  Chief Complaint  Patient presents with   Follow-up    Consult F/U    Has SOB, tightness in chest radiating to Jaw. Had abnormal stress test with lateral reversible defect.  Chest Pain  This is a new problem. The current episode started more than 1 month ago. The onset quality is sudden. The problem occurs intermittently. The pain is present in the substernal region. The pain is at a severity of 3/10. The pain is mild. The quality of the pain is described as tightness. Associated symptoms include shortness of breath.  Shortness of Breath This is a new problem. The current episode started more than 1 month ago. The problem has been waxing and waning. Associated symptoms include chest pain.      Past Medical History:  Diagnosis Date   Arthritis    Asthma    Diverticulosis of colon    GERD (gastroesophageal reflux disease)    Headache    H/O MIGRAINES   Hypertension    Hypothyroidism      Past Surgical History:  Procedure Laterality Date   CESAREAN SECTION     X3   COLONOSCOPY WITH PROPOFOL N/A 12/21/2015   Procedure: COLONOSCOPY WITH PROPOFOL;  Surgeon: Wyline Mood, MD;  Location: [facility] ENDOSCOPY;  Service: Endoscopy;  Laterality: N/A;   COLONOSCOPY WITH PROPOFOL N/A 12/16/2016   Procedure: COLONOSCOPY WITH PROPOFOL;  Surgeon: Wyline Mood, MD;  Location: [facility] ENDOSCOPY;  Service: Gastroenterology;  Laterality: N/A;   DIAGNOSTIC LAPAROSCOPY     DILATION AND CURETTAGE OF UTERUS     ESOPHAGOGASTRODUODENOSCOPY (EGD) WITH PROPOFOL N/A 12/21/2015   Procedure: ESOPHAGOGASTRODUODENOSCOPY (EGD) WITH PROPOFOL;  Surgeon: Wyline Mood, MD;  Location: [facility] ENDOSCOPY;  Service: Endoscopy;  Laterality: N/A;   ESOPHAGOGASTRODUODENOSCOPY (EGD)  WITH PROPOFOL N/A 01/02/2016   Procedure: ESOPHAGOGASTRODUODENOSCOPY (EGD) WITH PROPOFOL;  Surgeon: Midge Minium, MD;  Location: [facility] ENDOSCOPY;  Service: Endoscopy;  Laterality: N/A;   EXCISION MORTON'S NEUROMA Right    FINGER ARTHROPLASTY Left 08/21/2015   Procedure: LEFT THUMB  ARTHROPLASTY (CMC);  Surgeon: Deeann Saint, MD;  Location: [facility] ORS;  Service: Orthopedics;  Laterality: Left;   LOWER EXTREMITY ANGIOGRAPHY Bilateral 04/25/2020   Procedure: LOWER EXTREMITY ANGIOGRAPHY;  Surgeon: Renford Dills, MD;  Location: [facility] INVASIVE CV LAB;  Service: Cardiovascular;  Laterality: Bilateral;   morton neuromas removal right foot     TUBAL LIGATION     TUMMY TUCK  2014     Current Outpatient Medications  Medication Sig Dispense Refill   rosuvastatin (CRESTOR) 10 MG tablet Take 1 tablet (10 mg total) by mouth daily. 30 tablet 11   acetaminophen (TYLENOL) 500 MG tablet Take 1,000 mg by mouth every 6 (six) hours as needed for moderate pain, mild pain or headache.     albuterol (VENTOLIN HFA) 108 (90 Base) MCG/ACT inhaler Inhale 2 puffs into the lungs every 6 (six) hours as needed for shortness of breath or wheezing.     Ascorbic Acid (VITAMIN C PO) Take 1 tablet by mouth daily.     aspirin EC 81 MG tablet Take 1 tablet (81 mg total) by mouth daily. 30 tablet 3   BIOTIN PO Take 1 Dose by mouth daily. Liquid  bismuth subsalicylate (PEPTO BISMOL) 262 MG chewable tablet Chew 524 mg by mouth daily as needed for indigestion.     Cholecalciferol (DIALYVITE VITAMIN D 5000) 125 MCG (5000 UT) capsule Take 5,000 Units by mouth daily.     clopidogrel (PLAVIX) 75 MG tablet Take 1 tablet (75 mg total) by mouth daily. 30 tablet 4   fexofenadine (ALLEGRA) 180 MG tablet Take 180 mg by mouth daily.     fluticasone (FLONASE) 50 MCG/ACT nasal spray Place 1 spray into both nostrils daily as needed for allergies or rhinitis.     gabapentin (NEURONTIN) 300 MG capsule Take 300 mg by mouth daily as needed for pain.      ipratropium-albuterol (DUONEB) 0.5-2.5 (3) MG/3ML SOLN Inhale 3 mLs into the lungs every 6 (six) hours as needed (shortness of breath).     lisinopril-hydrochlorothiazide (ZESTORETIC) 20-25 MG tablet Take 1 tablet by mouth every evening.     meloxicam (MOBIC) 7.5 MG tablet Take 7.5 mg by mouth daily.     metoprolol succinate (TOPROL-XL) 25 MG 24 hr tablet Take 1 tablet (25 mg total) by mouth every evening. 30 tablet 2   Multiple Vitamin (MULTI-VITAMIN) tablet Take 1 tablet by mouth daily.     naphazoline-pheniramine (NAPHCON-A) 0.025-0.3 % ophthalmic solution Place 1 drop into both eyes 4 (four) times daily as needed for eye irritation or allergies.     OVER THE COUNTER MEDICATION Take 1 tablet by mouth daily. Gut alive otc supplement     Polyethyl Glycol-Propyl Glycol (SYSTANE) 0.4-0.3 % SOLN Place 1 drop into both eyes daily as needed (dry eyes).     Probiotic Product (PROBIOTIC PO) Take 1 capsule by mouth daily.     tiZANidine (ZANAFLEX) 4 MG tablet Take 4 mg by mouth daily as needed for muscle spasms.     traMADol (ULTRAM) 50 MG tablet Take 50 mg by mouth 4 (four) times daily as needed.     traMADol HCl 100 MG TABS Take 50 mg by mouth every 6 (six) hours as needed (pain).     No current facility-administered medications for this visit.    Allergies:   Latex, Ergotamine-caffeine, Other, Pineapple, Soy allergy, Codeine, and Ergot alkaloids    Social History:   reports that she has quit smoking. Her smoking use included cigarettes. She has never used smokeless tobacco. She reports current alcohol use of about 14.0 standard drinks of alcohol per week. She reports current drug use.   Family History:  family history includes Breast cancer in her maternal aunt; Cancer in her brother; Heart disease in her father and mother; Stroke in her mother.    ROS:     Review of Systems  Constitutional: Negative.   HENT: Negative.    Eyes: Negative.   Respiratory:  Positive for shortness of breath.    Cardiovascular:  Positive for chest pain.  Gastrointestinal: Negative.   Genitourinary: Negative.   Musculoskeletal: Negative.   Skin: Negative.   Neurological: Negative.   Endo/Heme/Allergies: Negative.   Psychiatric/Behavioral: Negative.    All other systems reviewed and are negative.     All other systems are reviewed and negative.    PHYSICAL EXAM: VS:  BP 139/76   Pulse 82   Ht 5\' 2"  (1.575 m)   Wt 171 lb 3.2 oz (77.7 kg)   SpO2 96%   BMI 31.31 kg/m  , BMI Body mass index is 31.31 kg/m. Last weight:  Wt Readings from Last 3 Encounters:  08/02/22 171 lb 3.2 oz (  77.7 kg)  11/23/20 161 lb (73 kg)  07/10/20 165 lb (74.8 kg)     Physical Exam Constitutional:      Appearance: Normal appearance.  Cardiovascular:     Rate and Rhythm: Normal rate and regular rhythm.     Heart sounds: Normal heart sounds.  Pulmonary:     Effort: Pulmonary effort is normal.     Breath sounds: Normal breath sounds.  Musculoskeletal:     Right lower leg: No edema.     Left lower leg: No edema.  Neurological:     Mental Status: She is alert.       EKG: NSR 85/min non specific st changes  Recent Labs: No results found for requested labs within last 365 days.    Lipid Panel No results found for: "CHOL", "TRIG", "HDL", "CHOLHDL", "VLDL", "LDLCALC", "LDLDIRECT"    Other studies Reviewed: Additional studies/ records that were reviewed today include:  Review of the above records demonstrates:       No data to display            ASSESSMENT AND PLAN:    ICD-10-CM   1. Atherosclerosis of native artery of both lower extremities with intermittent claudication (HCC)  I70.213 aspirin EC 81 MG tablet    metoprolol succinate (TOPROL-XL) 25 MG 24 hr tablet    clopidogrel (PLAVIX) 75 MG tablet    rosuvastatin (CRESTOR) 10 MG tablet    CT CORONARY MORPH W/CTA COR W/SCORE W/CA W/CM &/OR WO/CM    PCV ECHOCARDIOGRAM COMPLETE    2. Benign essential hypertension  I10 aspirin EC 81  MG tablet    metoprolol succinate (TOPROL-XL) 25 MG 24 hr tablet    clopidogrel (PLAVIX) 75 MG tablet    rosuvastatin (CRESTOR) 10 MG tablet    CT CORONARY MORPH W/CTA COR W/SCORE W/CA W/CM &/OR WO/CM    PCV ECHOCARDIOGRAM COMPLETE    3. Bilateral carotid artery stenosis  I65.23 aspirin EC 81 MG tablet    metoprolol succinate (TOPROL-XL) 25 MG 24 hr tablet    clopidogrel (PLAVIX) 75 MG tablet    rosuvastatin (CRESTOR) 10 MG tablet    CT CORONARY MORPH W/CTA COR W/SCORE W/CA W/CM &/OR WO/CM    PCV ECHOCARDIOGRAM COMPLETE    4. Peripheral vascular disease (HCC)  I73.9 aspirin EC 81 MG tablet    metoprolol succinate (TOPROL-XL) 25 MG 24 hr tablet    clopidogrel (PLAVIX) 75 MG tablet    rosuvastatin (CRESTOR) 10 MG tablet    CT CORONARY MORPH W/CTA COR W/SCORE W/CA W/CM &/OR WO/CM    PCV ECHOCARDIOGRAM COMPLETE    5. Chest pain due to myocardial ischemia, unspecified ischemic chest pain type  I25.9 CT CORONARY MORPH W/CTA COR W/SCORE W/CA W/CM &/OR WO/CM    CT CORONARY MORPH W/CTA COR W/SCORE W/CA W/CM &/OR WO/CM    PCV ECHOCARDIOGRAM COMPLETE       Problem List Items Addressed This Visit       Cardiovascular and Mediastinum   Peripheral vascular disease (HCC) (Chronic)   Relevant Medications   aspirin EC 81 MG tablet   metoprolol succinate (TOPROL-XL) 25 MG 24 hr tablet   clopidogrel (PLAVIX) 75 MG tablet   rosuvastatin (CRESTOR) 10 MG tablet   Other Relevant Orders   CT CORONARY MORPH W/CTA COR W/SCORE W/CA W/CM &/OR WO/CM   PCV ECHOCARDIOGRAM COMPLETE   Benign essential hypertension   Relevant Medications   aspirin EC 81 MG tablet   metoprolol succinate (TOPROL-XL) 25 MG 24  hr tablet   clopidogrel (PLAVIX) 75 MG tablet   rosuvastatin (CRESTOR) 10 MG tablet   Other Relevant Orders   CT CORONARY MORPH W/CTA COR W/SCORE W/CA W/CM &/OR WO/CM   PCV ECHOCARDIOGRAM COMPLETE   Carotid artery disease (HCC)   Relevant Medications   aspirin EC 81 MG tablet   metoprolol  succinate (TOPROL-XL) 25 MG 24 hr tablet   clopidogrel (PLAVIX) 75 MG tablet   rosuvastatin (CRESTOR) 10 MG tablet   Other Relevant Orders   CT CORONARY MORPH W/CTA COR W/SCORE W/CA W/CM &/OR WO/CM   PCV ECHOCARDIOGRAM COMPLETE   Atherosclerosis 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 (HCC) - Primary   Relevant Medications   aspirin EC 81 MG tablet   metoprolol succinate (TOPROL-XL) 25 MG 24 hr tablet   clopidogrel (PLAVIX) 75 MG tablet   rosuvastatin (CRESTOR) 10 MG tablet   Other Relevant Orders   CT CORONARY MORPH W/CTA COR W/SCORE W/CA W/CM &/OR WO/CM   PCV ECHOCARDIOGRAM COMPLETE   Other Visit Diagnoses     Chest pain due to myocardial ischemia, unspecified ischemic chest pain type       Relevant Orders   CT CORONARY MORPH W/CTA COR W/SCORE W/CA W/CM &/OR WO/CM   CT CORONARY MORPH W/CTA COR W/SCORE W/CA W/CM &/OR WO/CM   PCV ECHOCARDIOGRAM COMPLETE          Disposition:   No follow-ups on file.    Total time spent: 50 minutes  Signed,  Adrian Blackwater, MD  08/02/2022 12:24 PM    [facility]

## 2022-08-06 ENCOUNTER — Encounter: Payer: Self-pay | Admitting: Cardiovascular Disease

## 2022-08-06 ENCOUNTER — Ambulatory Visit (INDEPENDENT_AMBULATORY_CARE_PROVIDER_SITE_OTHER): Payer: Medicare Other

## 2022-08-06 ENCOUNTER — Other Ambulatory Visit: Payer: Self-pay | Admitting: Cardiovascular Disease

## 2022-08-06 DIAGNOSIS — I259 Chronic ischemic heart disease, unspecified: Secondary | ICD-10-CM | POA: Diagnosis not present

## 2022-08-06 DIAGNOSIS — I10 Essential (primary) hypertension: Secondary | ICD-10-CM

## 2022-08-06 LAB — CREATININE PICCOLO, WAIVED: Creatinine Piccolo, Waived: 0.8 mg/dL (ref 0.6–1.2)

## 2022-08-06 LAB — BUN PICCOLO, WAIVED: BUN Piccolo, Waived: 8 mg/dL (ref 7–22)

## 2022-08-06 MED ORDER — IOHEXOL 350 MG/ML SOLN
100.0000 mL | Freq: Once | INTRAVENOUS | Status: AC | PRN
  Administered 2022-08-06: 100 mL via INTRAVENOUS

## 2022-08-06 NOTE — Progress Notes (Signed)
Stat pic

## 2022-09-06 ENCOUNTER — Ambulatory Visit: Payer: Medicare Other | Admitting: Cardiovascular Disease

## 2022-09-09 ENCOUNTER — Emergency Department: Payer: Medicare Other

## 2022-09-09 ENCOUNTER — Inpatient Hospital Stay
Admission: EM | Admit: 2022-09-09 | Discharge: 2022-09-09 | DRG: 282 | Disposition: A | Discharge status: Other Acute Inpatient Hospital | Payer: Medicare Other | Attending: Internal Medicine | Admitting: Internal Medicine

## 2022-09-09 ENCOUNTER — Other Ambulatory Visit: Payer: Self-pay

## 2022-09-09 DIAGNOSIS — Z791 Long term (current) use of non-steroidal anti-inflammatories (NSAID): Secondary | ICD-10-CM | POA: Diagnosis not present

## 2022-09-09 DIAGNOSIS — Z91040 Latex allergy status: Secondary | ICD-10-CM

## 2022-09-09 DIAGNOSIS — Z79899 Other long term (current) drug therapy: Secondary | ICD-10-CM

## 2022-09-09 DIAGNOSIS — Z803 Family history of malignant neoplasm of breast: Secondary | ICD-10-CM

## 2022-09-09 DIAGNOSIS — I200 Unstable angina: Principal | ICD-10-CM

## 2022-09-09 DIAGNOSIS — I222 Subsequent non-ST elevation (NSTEMI) myocardial infarction: Principal | ICD-10-CM | POA: Diagnosis present

## 2022-09-09 DIAGNOSIS — I10 Essential (primary) hypertension: Secondary | ICD-10-CM | POA: Diagnosis not present

## 2022-09-09 DIAGNOSIS — I2089 Other forms of angina pectoris: Secondary | ICD-10-CM

## 2022-09-09 DIAGNOSIS — Z8249 Family history of ischemic heart disease and other diseases of the circulatory system: Secondary | ICD-10-CM

## 2022-09-09 DIAGNOSIS — Z823 Family history of stroke: Secondary | ICD-10-CM | POA: Diagnosis not present

## 2022-09-09 DIAGNOSIS — Z87891 Personal history of nicotine dependence: Secondary | ICD-10-CM | POA: Diagnosis not present

## 2022-09-09 DIAGNOSIS — I739 Peripheral vascular disease, unspecified: Secondary | ICD-10-CM | POA: Diagnosis not present

## 2022-09-09 DIAGNOSIS — R079 Chest pain, unspecified: Secondary | ICD-10-CM | POA: Diagnosis not present

## 2022-09-09 DIAGNOSIS — Z7982 Long term (current) use of aspirin: Secondary | ICD-10-CM | POA: Diagnosis not present

## 2022-09-09 DIAGNOSIS — E039 Hypothyroidism, unspecified: Secondary | ICD-10-CM | POA: Diagnosis present

## 2022-09-09 DIAGNOSIS — Z91018 Allergy to other foods: Secondary | ICD-10-CM

## 2022-09-09 DIAGNOSIS — Z9861 Coronary angioplasty status: Secondary | ICD-10-CM | POA: Diagnosis not present

## 2022-09-09 DIAGNOSIS — J4520 Mild intermittent asthma, uncomplicated: Secondary | ICD-10-CM | POA: Diagnosis present

## 2022-09-09 DIAGNOSIS — Z9109 Other allergy status, other than to drugs and biological substances: Secondary | ICD-10-CM

## 2022-09-09 DIAGNOSIS — E785 Hyperlipidemia, unspecified: Secondary | ICD-10-CM | POA: Diagnosis present

## 2022-09-09 DIAGNOSIS — Z87892 Personal history of anaphylaxis: Secondary | ICD-10-CM | POA: Diagnosis not present

## 2022-09-09 DIAGNOSIS — I25110 Atherosclerotic heart disease of native coronary artery with unstable angina pectoris: Secondary | ICD-10-CM | POA: Diagnosis present

## 2022-09-09 DIAGNOSIS — K219 Gastro-esophageal reflux disease without esophagitis: Secondary | ICD-10-CM | POA: Diagnosis not present

## 2022-09-09 DIAGNOSIS — I2510 Atherosclerotic heart disease of native coronary artery without angina pectoris: Secondary | ICD-10-CM

## 2022-09-09 DIAGNOSIS — Z7902 Long term (current) use of antithrombotics/antiplatelets: Secondary | ICD-10-CM | POA: Diagnosis not present

## 2022-09-09 LAB — CBC
HCT: 39.8 % (ref 36.0–46.0)
Hemoglobin: 13.4 g/dL (ref 12.0–15.0)
MCH: 32.1 pg (ref 26.0–34.0)
MCHC: 33.7 g/dL (ref 30.0–36.0)
MCV: 95.4 fL (ref 80.0–100.0)
Platelets: 476 10*3/uL — ABNORMAL HIGH (ref 150–400)
RBC: 4.17 MIL/uL (ref 3.87–5.11)
RDW: 11.9 % (ref 11.5–15.5)
WBC: 9.4 10*3/uL (ref 4.0–10.5)
nRBC: 0 % (ref 0.0–0.2)

## 2022-09-09 LAB — COMPREHENSIVE METABOLIC PANEL
ALT: 37 U/L (ref 0–44)
AST: 33 U/L (ref 15–41)
Albumin: 4.3 g/dL (ref 3.5–5.0)
Alkaline Phosphatase: 79 U/L (ref 38–126)
Anion gap: 11 (ref 5–15)
BUN: 11 mg/dL (ref 8–23)
CO2: 23 mmol/L (ref 22–32)
Calcium: 9.1 mg/dL (ref 8.9–10.3)
Chloride: 101 mmol/L (ref 98–111)
Creatinine, Ser: 0.75 mg/dL (ref 0.44–1.00)
GFR, Estimated: >60 mL/min (ref >60)
Glucose, Bld: 105 mg/dL — ABNORMAL HIGH (ref 70–99)
Potassium: 4.3 mmol/L (ref 3.5–5.1)
Sodium: 135 mmol/L (ref 135–145)
Total Bilirubin: 0.9 mg/dL (ref 0.3–1.2)
Total Protein: 7.3 g/dL (ref 6.5–8.1)

## 2022-09-09 LAB — TROPONIN I (HIGH SENSITIVITY)
Troponin I (High Sensitivity): 19 ng/L — ABNORMAL HIGH (ref <18)
Troponin I (High Sensitivity): 122 ng/L (ref <18)

## 2022-09-09 LAB — PROTIME-INR
INR: 1 (ref 0.8–1.2)
Prothrombin Time: 13.5 seconds (ref 11.4–15.2)

## 2022-09-09 LAB — LIPASE, BLOOD: Lipase: 43 U/L (ref 11–51)

## 2022-09-09 LAB — APTT: aPTT: 31 seconds (ref 24–36)

## 2022-09-09 LAB — HIV ANTIBODY (ROUTINE TESTING W REFLEX): HIV Screen 4th Generation wRfx: NONREACTIVE

## 2022-09-09 SURGERY — LEFT HEART CATH AND CORONARY ANGIOGRAPHY
Anesthesia: Moderate Sedation | Laterality: Right

## 2022-09-09 MED ORDER — HEPARIN (PORCINE) 25000 UT/250ML-% IV SOLN
800.0000 [IU]/h | INTRAVENOUS | Status: DC
  Administered 2022-09-09: 800 [IU]/h via INTRAVENOUS
  Filled 2022-09-09: qty 250

## 2022-09-09 MED ORDER — TRAMADOL HCL 50 MG PO TABS: 50.0000 mg | ORAL_TABLET | Freq: Four times a day (QID) | ORAL | Status: DC | PRN

## 2022-09-09 MED ORDER — IPRATROPIUM-ALBUTEROL 0.5-2.5 (3) MG/3ML IN SOLN: 3.0000 mL | Freq: Four times a day (QID) | RESPIRATORY_TRACT | Status: DC | PRN

## 2022-09-09 MED ORDER — ALBUTEROL SULFATE (2.5 MG/3ML) 0.083% IN NEBU: 3.0000 mL | INHALATION_SOLUTION | Freq: Four times a day (QID) | RESPIRATORY_TRACT | Status: DC | PRN

## 2022-09-09 MED ORDER — ACETAMINOPHEN 325 MG PO TABS: 650.0000 mg | ORAL_TABLET | ORAL | Status: DC | PRN

## 2022-09-09 MED ORDER — FUROSEMIDE 40 MG PO TABS
20.0000 mg | ORAL_TABLET | Freq: Every day | ORAL | Status: DC
  Administered 2022-09-09: 20 mg via ORAL
  Filled 2022-09-09 (×2): qty 1

## 2022-09-09 MED ORDER — METHOCARBAMOL 500 MG PO TABS: 500.0000 mg | ORAL_TABLET | Freq: Every day | ORAL | Status: DC | PRN

## 2022-09-09 MED ORDER — DIPHENHYDRAMINE HCL 12.5 MG/5ML PO LIQD: 12.5000 mg | Freq: Four times a day (QID) | ORAL | Status: DC | PRN

## 2022-09-09 MED ORDER — POLYVINYL ALCOHOL 1.4 % OP SOLN: 1.0000 [drp] | Freq: Every day | OPHTHALMIC | Status: DC | PRN

## 2022-09-09 MED ORDER — ASPIRIN 81 MG PO TBEC: 81.0000 mg | DELAYED_RELEASE_TABLET | Freq: Every day | ORAL | Status: DC

## 2022-09-09 MED ORDER — METOPROLOL TARTRATE 50 MG PO TABS
50.0000 mg | ORAL_TABLET | Freq: Two times a day (BID) | ORAL | Status: DC
  Filled 2022-09-09: qty 1

## 2022-09-09 MED ORDER — TIZANIDINE HCL 2 MG PO TABS: 4.0000 mg | ORAL_TABLET | Freq: Every day | ORAL | Status: DC | PRN

## 2022-09-09 MED ORDER — ATORVASTATIN CALCIUM 20 MG PO TABS
80.0000 mg | ORAL_TABLET | Freq: Every day | ORAL | Status: DC
  Administered 2022-09-09: 80 mg via ORAL
  Filled 2022-09-09: qty 4

## 2022-09-09 MED ORDER — GABAPENTIN 300 MG PO CAPS: 300.0000 mg | ORAL_CAPSULE | Freq: Every day | ORAL | Status: DC | PRN

## 2022-09-09 MED ORDER — BISMUTH SUBSALICYLATE 262 MG PO CHEW: 524.0000 mg | CHEWABLE_TABLET | Freq: Every day | ORAL | Status: DC | PRN

## 2022-09-09 MED ORDER — CLOPIDOGREL BISULFATE 75 MG PO TABS
75.0000 mg | ORAL_TABLET | Freq: Every day | ORAL | Status: DC
  Administered 2022-09-09: 75 mg via ORAL
  Filled 2022-09-09: qty 1

## 2022-09-09 MED ORDER — FLUTICASONE PROPIONATE 50 MCG/ACT NA SUSP: 1.0000 | Freq: Every day | NASAL | Status: DC | PRN

## 2022-09-09 MED ORDER — LORATADINE 10 MG PO TABS
10.0000 mg | ORAL_TABLET | Freq: Every day | ORAL | Status: DC
  Administered 2022-09-09: 10 mg via ORAL
  Filled 2022-09-09: qty 1

## 2022-09-09 MED ORDER — MELOXICAM 7.5 MG PO TABS: 7.5000 mg | ORAL_TABLET | Freq: Every day | ORAL | Status: DC

## 2022-09-09 MED ORDER — ASPIRIN 81 MG PO CHEW: 324.0000 mg | CHEWABLE_TABLET | ORAL | Status: DC

## 2022-09-09 MED ORDER — ROSUVASTATIN CALCIUM 10 MG PO TABS: 10.0000 mg | ORAL_TABLET | Freq: Every day | ORAL | Status: DC

## 2022-09-09 MED ORDER — NAPHAZOLINE-PHENIRAMINE 0.025-0.3 % OP SOLN: 1.0000 [drp] | Freq: Four times a day (QID) | OPHTHALMIC | Status: DC | PRN

## 2022-09-09 MED ORDER — NITROGLYCERIN 0.1 MG/HR TD PT24
0.1000 mg | MEDICATED_PATCH | Freq: Every day | TRANSDERMAL | Status: DC
  Filled 2022-09-09: qty 1

## 2022-09-09 MED ORDER — ONDANSETRON HCL 4 MG/2ML IJ SOLN: 4.0000 mg | Freq: Four times a day (QID) | INTRAMUSCULAR | Status: DC | PRN

## 2022-09-09 MED ORDER — LISINOPRIL 20 MG PO TABS
20.0000 mg | ORAL_TABLET | Freq: Every evening | ORAL | Status: DC
  Filled 2022-09-09: qty 1

## 2022-09-09 MED ORDER — SODIUM CHLORIDE 0.9 % IV BOLUS
1000.0000 mL | Freq: Once | INTRAVENOUS | Status: AC
  Administered 2022-09-09: 1000 mL via INTRAVENOUS

## 2022-09-09 MED ORDER — ASPIRIN 300 MG RE SUPP: 300.0000 mg | RECTAL | Status: DC

## 2022-09-09 MED ORDER — HEPARIN BOLUS VIA INFUSION
4000.0000 [IU] | Freq: Once | INTRAVENOUS | Status: AC
  Administered 2022-09-09: 4000 [IU] via INTRAVENOUS
  Filled 2022-09-09: qty 4000

## 2022-09-09 MED ORDER — SODIUM CHLORIDE 0.9 % IV SOLN: INTRAVENOUS | Status: DC

## 2022-09-09 MED ORDER — NITROGLYCERIN 0.4 MG SL SUBL: 0.4000 mg | SUBLINGUAL_TABLET | SUBLINGUAL | Status: DC | PRN

## 2022-09-09 MED ORDER — ACETAMINOPHEN 500 MG PO TABS: 1000.0000 mg | ORAL_TABLET | Freq: Four times a day (QID) | ORAL | Status: DC | PRN

## 2022-09-09 NOTE — ED Notes (Signed)
Date and time results received: 09/09/22 1007  Test: Troponin Critical Value: 122  Name of Provider Notified: Mikey College, MD

## 2022-09-09 NOTE — ED Notes (Signed)
Patient assessed by this RN and Elana RN while giving morning medications. Patient was found to have a low BP. BP at 0925 was 81/46, at 0930 it was 58/47, at 0935 it was 98/49, at 0940 it was 84/52, and at 0950 it was 108/50. MD Zhang notified. Lasix, metoprolol and nitroglycerin were all held at this time. 500 mL bolus of normal saline started.

## 2022-09-09 NOTE — ED Triage Notes (Signed)
BIB medic for bilateral underarm and under jaw pain. No reports of n/v/d, chest pain, fever, or sob. MI hx

## 2022-09-09 NOTE — Progress Notes (Signed)
Patient had episode of chest pain but EKG had no acute changes and troponin is only slightly elevated.  I spoke to Dr. Alanda Slim at [facility] CT surgery and they have accepted the [phone], and [phone], is the phone number.

## 2022-09-09 NOTE — H&P (Signed)
History and Physical    Sallee Unruh XNA:[id number] DOB: 06/06/1955 DOA: 09/09/2022  PCP: Sherrie Mustache, MD (Confirm with patient/family/NH records and if not entered, this has to be entered at [facility] point of entry) Patient coming from: Home  I have personally briefly reviewed patient's old medical records in [facility]  Chief Complaint: Chest pain  HPI: Evelyn Simon is a 67 y.o. female with medical history significant of CAD, PVD on Plavix, HTN, HLD, mild intermittent asthma, presented with recurrent chest pains.  Patient started to have intermittent chest pain radiating to left shoulder and left forearm last month.  She occasionally has had such pain at rest and went to see cardiology Dr. Adrian Blackwater last month and underwent stress test and coronary CTA.  Stress test showed lateral reversible defect and CTA showed heavy calcification RCA, LAD and LCx with calcium score 3213.  Patient is due for the next visit to discussed next step of workup.  2 weeks ago patient went to [facility] for a business trip and she had recurrent chest pains and went to a local hospital.  When she was diagnosed with NSTEMI and hospitalized.  Subsequently she underwent cardiac cath and the results " concerning for multivessel disease".  Hospital proposed inpatient CABG however patient declined and came back home last Thursday.  Since then she has had intermittent similar chest pains and last night she woke up with chest pains 4-5/10, left jaw radiating to left forearm.  ED Course: Vital signs stable, no tachycardia nonhypotensive SBP 130s.  EKG showed no acute ST changes.  Troponin 19 of first set.  Review of Systems: As per HPI otherwise 14 point review of systems negative.    Past Medical History:  Diagnosis Date   Arthritis    Asthma    Diverticulosis of colon    GERD (gastroesophageal reflux disease)    Headache    H/O MIGRAINES   Hypertension    Hypothyroidism     Past Surgical History:   Procedure Laterality Date   CESAREAN SECTION     X3   COLONOSCOPY WITH PROPOFOL N/A 12/21/2015   Procedure: COLONOSCOPY WITH PROPOFOL;  Surgeon: Wyline Mood, MD;  Location: [facility] ENDOSCOPY;  Service: Endoscopy;  Laterality: N/A;   COLONOSCOPY WITH PROPOFOL N/A 12/16/2016   Procedure: COLONOSCOPY WITH PROPOFOL;  Surgeon: Wyline Mood, MD;  Location[facility] ENDOSCOPY;  Service: Gastroenterology;  Laterality: N/A;   DIAGNOSTIC LAPAROSCOPY     DILATION AND CURETTAGE OF UTERUS     ESOPHAGOGASTRODUODENOSCOPY (EGD) WITH PROPOFOL N/A 12/21/2015   Procedure: ESOPHAGOGASTRODUODENOSCOPY (EGD) WITH PROPOFOL;  Surgeon: Wyline Mood, MD;  Location: [facility] ENDOSCOPY;  Service: Endoscopy;  Laterality: N/A;   ESOPHAGOGASTRODUODENOSCOPY (EGD) WITH PROPOFOL N/A 01/02/2016   Procedure: ESOPHAGOGASTRODUODENOSCOPY (EGD) WITH PROPOFOL;  Surgeon: Midge Minium, MD;  Location: [facility] ENDOSCOPY;  Service: Endoscopy;  Laterality: N/A;   EXCISION MORTON'S NEUROMA Right    FINGER ARTHROPLASTY Left 08/21/2015   Procedure: LEFT THUMB  ARTHROPLASTY (CMC);  Surgeon: Deeann Saint, MD;  Location: [facility] ORS;  Service: Orthopedics;  Laterality: Left;   LOWER EXTREMITY ANGIOGRAPHY Bilateral 04/25/2020   Procedure: LOWER EXTREMITY ANGIOGRAPHY;  Surgeon: Renford Dills, MD;  Location: [facility] INVASIVE CV LAB;  Service: Cardiovascular;  Laterality: Bilateral;   morton neuromas removal right foot     TUBAL LIGATION     TUMMY TUCK  2014     reports that she has quit smoking. Her smoking use included cigarettes. She has never used smokeless tobacco. She reports current alcohol  use of about 14.0 standard drinks of alcohol per week. She reports current drug use.  Allergies  Allergen Reactions   Latex Anaphylaxis   Ergotamine-Caffeine Hives    Body rash   Other Diarrhea and Nausea And Vomiting     AVOCADO    Pineapple Other (See Comments)    MOUTH SORES   Soy Allergy Hives   Ergot Alkaloids Rash and Hives    Family History  Problem  Relation Age of Onset   Breast cancer Maternal Aunt    Stroke Mother    Heart disease Mother    Cancer Brother        LUNG   Heart disease Father      Prior to Admission medications   Medication Sig Start Date End Date Taking? Authorizing Provider  atorvastatin (LIPITOR) 80 MG tablet Take 80 mg by mouth daily. 09/04/22  Yes [provider]  celecoxib (CELEBREX) 200 MG capsule Take 200 mg by mouth daily. 07/05/22  Yes [provider]  diphenhydrAMINE (BENADRYL) 12.5 MG/5ML liquid Take by mouth 4 (four) times daily as needed.   Yes [provider]  furosemide (LASIX) 20 MG tablet Take 20 mg by mouth daily. 09/04/22  Yes [provider]  lisinopril (ZESTRIL) 20 MG tablet Take 20 mg by mouth daily. 09/04/22  Yes [provider]  methocarbamol (ROBAXIN) 500 MG tablet Take 500 mg by mouth daily as needed. 04/19/22  Yes [provider]  metoprolol tartrate (LOPRESSOR) 50 MG tablet Take 50 mg by mouth 2 (two) times daily. 09/04/22  Yes [provider]  acetaminophen (TYLENOL) 500 MG tablet Take 1,000 mg by mouth every 6 (six) hours as needed for moderate pain, mild pain or headache.    [provider]  albuterol (VENTOLIN HFA) 108 (90 Base) MCG/ACT inhaler Inhale 2 puffs into the lungs every 6 (six) hours as needed for shortness of breath or wheezing. 12/10/17   [provider]  Ascorbic Acid (VITAMIN C PO) Take 1 tablet by mouth daily.    [provider]  aspirin EC 81 MG tablet Take 1 tablet (81 mg total) by mouth daily. 08/02/22   Laurier Nancy, MD  BIOTIN PO Take 1 Dose by mouth daily. Liquid    [provider]  bismuth subsalicylate (PEPTO BISMOL) 262 MG chewable tablet Chew 524 mg by mouth daily as needed for indigestion.    [provider]  Cholecalciferol (DIALYVITE VITAMIN D 5000) 125 MCG (5000 UT) capsule Take 5,000 Units by mouth daily.    [provider]  clopidogrel (PLAVIX)  75 MG tablet Take 1 tablet (75 mg total) by mouth daily. 08/02/22   Laurier Nancy, MD  fexofenadine (ALLEGRA) 180 MG tablet Take 180 mg by mouth daily.    [provider]  fluticasone (FLONASE) 50 MCG/ACT nasal spray Place 1 spray into both nostrils daily as needed for allergies or rhinitis.    [provider]  gabapentin (NEURONTIN) 300 MG capsule Take 300 mg by mouth daily as needed for pain. 08/18/19   [provider]  ipratropium-albuterol (DUONEB) 0.5-2.5 (3) MG/3ML SOLN Inhale 3 mLs into the lungs every 6 (six) hours as needed (shortness of breath). 03/03/20   [provider]  lisinopril-hydrochlorothiazide (ZESTORETIC) 20-25 MG tablet Take 1 tablet by mouth every evening. 08/30/19   [provider]  meloxicam (MOBIC) 7.5 MG tablet Take 7.5 mg by mouth daily.    [provider]  metoprolol succinate (TOPROL-XL) 25 MG 24  hr tablet Take 1 tablet (25 mg total) by mouth every evening. 08/02/22   Laurier Nancy, MD  Multiple Vitamin (MULTI-VITAMIN) tablet Take 1 tablet by mouth daily.    [provider]  naphazoline-pheniramine (NAPHCON-A) 0.025-0.3 % ophthalmic solution Place 1 drop into both eyes 4 (four) times daily as needed for eye irritation or allergies.    [provider]  OVER THE COUNTER MEDICATION Take 1 tablet by mouth daily. Gut alive otc supplement    [provider]  Polyethyl Glycol-Propyl Glycol (SYSTANE) 0.4-0.3 % SOLN Place 1 drop into both eyes daily as needed (dry eyes).    [provider]  Probiotic Product (PROBIOTIC PO) Take 1 capsule by mouth daily.    [provider]  rosuvastatin (CRESTOR) 10 MG tablet Take 1 tablet (10 mg total) by mouth daily. 08/02/22 08/02/23  Laurier Nancy, MD  tiZANidine (ZANAFLEX) 4 MG tablet Take 4 mg by mouth daily as needed for muscle spasms.    [provider]  traMADol (ULTRAM) 50 MG tablet Take 50 mg by mouth 4 (four) times daily as needed.  10/16/20   [provider]  traMADol HCl 100 MG TABS Take 50 mg by mouth every 6 (six) hours as needed (pain).    [provider]    Physical Exam: Vitals:   09/09/22 0607 09/09/22 0630  BP:  137/83  Pulse:  83  Resp:  19  Temp:  98.2 F (36.8 C)  TempSrc:  Oral  SpO2:  96%  Weight: 77.1 kg   Height: 5\' 2"  (1.575 m)     Constitutional: NAD, calm, comfortable Vitals:   09/09/22 0607 09/09/22 0630  BP:  137/83  Pulse:  83  Resp:  19  Temp:  98.2 F (36.8 C)  TempSrc:  Oral  SpO2:  96%  Weight: 77.1 kg   Height: 5\' 2"  (1.575 m)    Eyes: PERRL, lids and conjunctivae normal ENMT: Mucous membranes are moist. Posterior pharynx clear of any exudate or lesions.Normal dentition.  Neck: normal, supple, no masses, no thyromegaly Respiratory: clear to auscultation bilaterally, no wheezing, no crackles. Normal respiratory effort. No accessory muscle use.  Cardiovascular: Regular rate and rhythm, no murmurs / rubs / gallops. No extremity edema. 2+ pedal pulses. No carotid bruits.  Abdomen: no tenderness, no masses palpated. No hepatosplenomegaly. Bowel sounds positive.  Musculoskeletal: no clubbing / cyanosis. No joint deformity upper and lower extremities. Good ROM, no contractures. Normal muscle tone.  Skin: no rashes, lesions, ulcers. No induration Neurologic: CN 2-12 grossly intact. Sensation intact, DTR normal. Strength 5/5 in all 4.  Psychiatric: Normal judgment and insight. Alert and oriented x 3. Normal mood.     Labs on Admission: I have personally reviewed following labs and imaging studies  CBC: Recent Labs  Lab 09/09/22 0614  WBC 9.4  HGB 13.4  HCT 39.8  MCV 95.4  PLT 476*   Basic Metabolic Panel: Recent Labs  Lab 09/09/22 0614  NA 135  K 4.3  CL 101  CO2 23  GLUCOSE 105*  BUN 11  CREATININE 0.75  CALCIUM 9.1   GFR: Estimated Creatinine Clearance: 66.5 mL/min (by C-G formula based on SCr of 0.75 mg/dL). Liver Function Tests: Recent  Labs  Lab 09/09/22 0614  AST 33  ALT 37  ALKPHOS 79  BILITOT 0.9  PROT 7.3  ALBUMIN 4.3   Recent Labs  Lab 09/09/22 0614  LIPASE 43   No results for input(s): "AMMONIA" in the last  168 hours. Coagulation Profile: No results for input(s): "INR", "PROTIME" in the last 168 hours. Cardiac Enzymes: No results for input(s): "CKTOTAL", "CKMB", "CKMBINDEX", "TROPONINI" in the last 168 hours. BNP (last 3 results) No results for input(s): "PROBNP" in the last 8760 hours. HbA1C: No results for input(s): "HGBA1C" in the last 72 hours. CBG: No results for input(s): "GLUCAP" in the last 168 hours. Lipid Profile: No results for input(s): "CHOL", "HDL", "LDLCALC", "TRIG", "CHOLHDL", "LDLDIRECT" in the last 72 hours. Thyroid Function Tests: No results for input(s): "TSH", "T4TOTAL", "FREET4", "T3FREE", "THYROIDAB" in the last 72 hours. Anemia Panel: No results for input(s): "VITAMINB12", "FOLATE", "FERRITIN", "TIBC", "IRON", "RETICCTPCT" in the last 72 hours. Urine analysis:    Component Value Date/Time   COLORURINE YELLOW (A) 01/31/2018 1251   APPEARANCEUR CLEAR (A) 01/31/2018 1251   LABSPEC 1.011 01/31/2018 1251   PHURINE 6.0 01/31/2018 1251   GLUCOSEU NEGATIVE 01/31/2018 1251   HGBUR NEGATIVE 01/31/2018 1251   BILIRUBINUR NEGATIVE 01/31/2018 1251   KETONESUR NEGATIVE 01/31/2018 1251   PROTEINUR NEGATIVE 01/31/2018 1251   NITRITE NEGATIVE 01/31/2018 1251   LEUKOCYTESUR NEGATIVE 01/31/2018 1251    Radiological Exams on Admission: DG Chest Port 1 View  Result Date: 09/09/2022 CLINICAL DATA:  Chest pain. EXAM: PORTABLE CHEST 1 VIEW COMPARISON:  PA Lat 07/10/2020 FINDINGS: There is mild cardiomegaly. There are prominent bilateral epicardial fat pads. No vascular congestion is seen. The mediastinum is normally outlined. The lungs are clear of infiltrates. There is linear scarring or atelectasis in the lateral left lower lung zone. Regional osseous structures are intact. Multiple  overlying monitor wires. IMPRESSION: No acute cardiopulmonary disease. Mild cardiomegaly. Linear scarring or atelectasis in the lateral left lower lung zone. Electronically Signed   By: Almira Bar M.D.   On: 09/09/2022 06:48    EKG: Independently reviewed.  Sinus rhythm, no acute ST changes. Assessment/Plan Principal Problem:   Angina at rest Active Problems:   CAD S/P percutaneous coronary angioplasty   Unstable angina (HCC)  (please populate well all problems here in Problem List. (For example, if patient is on BP meds at home and you resume or decide to hold them, it is a problem that needs to be her. Same for CAD, COPD, HLD and so on)  Unstable angina -With underlying no multivessel CAD and recent NSTEMI, will initiate heparin drip -Nitropaste -Discussed with patient cardiology Dr.Khan, who will see the patient today for consult. -Continue aspirin Plavix beta-blocker and statin  HTN -Continue beta-blocker, HCTZ/lisinopril  HLD -Continue statin, check lipid panel  Mild asthma -No symptoms signs of of acute exacerbation  DVT prophylaxis: Heparin drip Code Status: Full code Family Communication: Daughter at bedside Disposition Plan: Patient is sick with a ACS regarding patient cardiology consult and AS treatment, expect more than 2 midnight hospital stay Consults called: Dr. Welton Flakes cardiology Admission status: PCU   Emeline General MD [facility] Pager [phone]  09/09/2022, 7:37 AM

## 2022-09-09 NOTE — ED Notes (Signed)
Pt. Complaint of sharp, left sided chest pain, Dr. Chipper Herb and Dr. Welton Flakes notified, EKG obtained.

## 2022-09-09 NOTE — ED Provider Notes (Addendum)
[facility] Provider Note    Event Date/Time   First MD Initiated Contact with Patient 09/09/22 [phone]     (approximate)   History   Chest Pain (BIB medic for bilateral underarm and under jaw pain. No reports of n/v/d, chest pain, fever, or sob. MI hx)   HPI  Evelyn Simon is a 67 y.o. female   Past medical history of CAD, PVD, asthma, arthritis, chronic pain, hypothyroid and hypertension presents emergency department with chest pain.  She has had intermittent chest pain in the past, described more as a jaw and bilateral underarm pain but this has been associated with anginal pain in the past, most recently had this sensation when she was in [facility] last week and was hospitalized with cardiac catheterization showing multivessel disease and was recommended to stay for CABG but she did not want to stay in [place] and came home to [place].  In the last week she has been chest pain-free but it recurred while in bed tonight.  No associated new respiratory symptoms, cough, shortness of breath, fever.  She was given a full dose of aspirin by EMS.  Independent Historian contributed to assessment above: EMS provides history as above  External Medical Documents Reviewed: Cardiology note from July 2024 as well as a cardiac CT showing multivessel disease      Physical Exam   Triage Vital Signs: ED Triage Vitals  Encounter Vitals Group     BP --      Systolic BP Percentile --      Diastolic BP Percentile --      Pulse --      Resp --      Temp --      Temp src --      SpO2 --      Weight 09/09/22 0607 170 lb (77.1 kg)     Height 09/09/22 0607 5\' 2"  (1.575 m)     Head Circumference --      Peak Flow --      Pain Score 09/09/22 0605 5     Pain Loc --      Pain Education --      Exclude from Growth Chart --     Most recent vital signs: Vitals:   09/09/22 0630  BP: 137/83  Pulse: 83  Resp: 19  Temp: 98.2 F (36.8 C)  SpO2: 96%     General: Awake, no distress.  CV:  Good peripheral perfusion.  Resp:  Normal effort.  Abd:  No distention.  Other:  Comfortable appearing, pleasant woman in no acute distress.  Lungs clear to auscultation without wheezing or focality bilaterally, euvolemic, radial pulses intact and equal bilaterally.   ED Results / Procedures / Treatments   Labs (all labs ordered are listed, but only abnormal results are displayed) Labs Reviewed  CBC - Abnormal; Notable for the following components:      Result Value   Platelets 476 (*)    All other components within normal limits  COMPREHENSIVE METABOLIC PANEL - Abnormal; Notable for the following components:   Glucose, Bld 105 (*)    All other components within normal limits  TROPONIN I (HIGH SENSITIVITY) - Abnormal; Notable for the following components:   Troponin I (High Sensitivity) 19 (*)    All other components within normal limits  LIPASE, BLOOD     I ordered and reviewed the above labs they are notable for nl cell counts  EKG  ED  ECG REPORT I, Pilar Jarvis, the attending physician, personally viewed and interpreted this ECG.   Date: 09/09/2022  EKG Time: 0611  Rate: 83  Rhythm: nsr  Axis: nl  Intervals:none  ST&T Change: no stemi    RADIOLOGY I independently reviewed and interpreted cxr and see no obvious focality or pneumothorax I also reviewed radiologist's formal read.   PROCEDURES:  Critical Care performed: Yes, see critical care procedure note(s)  .Critical Care  Performed by: Pilar Jarvis, MD Authorized by: Pilar Jarvis, MD   Critical care provider statement:    Critical care time (minutes):  30   Critical care was time spent personally by me on the following activities:  Development of treatment plan with patient or surrogate, discussions with consultants, evaluation of patient's response to treatment, examination of patient, ordering and review of laboratory studies, ordering and review of radiographic  studies, ordering and performing treatments and interventions, pulse oximetry, re-evaluation of patient's condition and review of old charts    MEDICATIONS ORDERED IN ED: Medications - No data to display   IMPRESSION / MDM / ASSESSMENT AND PLAN / ED COURSE  I reviewed the triage vital signs and the nursing notes.                                Patient's presentation is most consistent with acute presentation with potential threat to life or bodily function.  Differential diagnosis includes, but is not limited to, ACS unstable angina, PE, dissection, musculoskeletal pain, pneumothorax, respiratory infection   The patient is on the cardiac monitor to evaluate for evidence of arrhythmia and/or significant heart rate changes.  MDM:    This patient with known cardiovascular disease and multivessel disease here with chest discomfort reminiscent of her prior MI that she was diagnosed recently in [facility], she states that was NSTEMI with troponin elevation, with recommendation from her [facility] to perform CABG but she decided to come back home.  Recurrence of pain while at rest.  Full dose aspirin given by EMS.  No STEMI on EKG.  I think she must be admitted given her unstable angina and known cardiac disease regardless of her troponins.  Troponins pending at the time of signout.  She is stable.  Admission.   -- Initial trop 19 Heparin for unstable angina.  Admit       FINAL CLINICAL IMPRESSION(S) / ED DIAGNOSES   Final diagnoses:  Unstable angina (HCC)     Rx / DC Orders   ED Discharge Orders     None        Note:  This document was prepared using Dragon voice recognition software and may include unintentional dictation errors.    Pilar Jarvis, MD 09/09/22 2956    Pilar Jarvis, MD 09/09/22 [phone]

## 2022-09-09 NOTE — Consult Note (Addendum)
Evelyn Simon is a 67 y.o. female  [id number]  Primary Cardiologist: Adrian Blackwater Reason for Consultation: Chest pain unstable angina  HPI: This is a 67 year old female who missed her appointment after CTA coronaries and abnormal stress test in the office and presented to the hospital with chest pain.  Her CTA coronary showed three-vessel coronary artery disease with severe calcification and was supposed to be seen last week but did not follow-up.  She presented now with chest pain.   Review of Systems: No orthopnea PND or leg swelling   Past Medical History:  Diagnosis Date   Arthritis    Asthma    Diverticulosis of colon    GERD (gastroesophageal reflux disease)    Headache    H/O MIGRAINES   Hypertension    Hypothyroidism     (Not in a hospital admission)     aspirin  324 mg Oral NOW   Or   aspirin  300 mg Rectal NOW   [START ON 09/10/2022] aspirin EC  81 mg Oral Daily   atorvastatin  80 mg Oral Daily   clopidogrel  75 mg Oral Daily   furosemide  20 mg Oral Daily   heparin  4,000 Units Intravenous Once   lisinopril  20 mg Oral QPM   loratadine  10 mg Oral Daily   metoprolol tartrate  50 mg Oral BID   nitroGLYCERIN  0.1 mg Transdermal Daily    Infusions:  heparin      Allergies  Allergen Reactions   Latex Anaphylaxis   Ergotamine-Caffeine Hives    Body rash   Other Diarrhea and Nausea And Vomiting     AVOCADO    Pineapple Other (See Comments)    MOUTH SORES   Soy Allergy Hives   Ergot Alkaloids Rash and Hives    Social History   Socioeconomic History   Marital status: Divorced    Spouse name: Not on file   Number of children: Not on file   Years of education: Not on file   Highest education level: Not on file  Occupational History   Not on file  Tobacco Use   Smoking status: Former    Types: Cigarettes   Smokeless tobacco: Never  Vaping Use   Vaping status: Never Used  Substance and Sexual Activity   Alcohol use: Yes    Alcohol/week:  14.0 standard drinks of alcohol    Types: 14 Glasses of wine per week   Drug use: Yes    Comment: prescribed tramadol   Sexual activity: Never  Other Topics Concern   Not on file  Social History Narrative   Not on file   Social Determinants of Health   Financial Resource Strain: Low Risk  (07/27/2022)   Received from [facility]   Overall Financial Resource Strain (CARDIA)    Difficulty of Paying Living Expenses: Not hard at all  Food Insecurity: No Food Insecurity (07/27/2022)   Received from [facility]   Hunger Vital Sign    Worried About Running Out of Food in the Last Year: Never true    Ran Out of Food in the Last Year: Never true  Transportation Needs: No Transportation Needs (07/27/2022)   Received from [facility] - Transportation    In the past 12 months, has lack of transportation kept you from medical appointments or from getting medications?: No    Lack of Transportation (Non-Medical): No  Physical Activity: Not on  file  Stress: Not on file  Social Connections: Unknown (03/06/2022)   Received from [facility]   Social Connections    In the past 3 months, do you feel that you lack companionship or social support?: Not on file  Intimate Partner Violence: Not on file    Family History  Problem Relation Age of Onset   Breast cancer Maternal Aunt    Stroke Mother    Heart disease Mother    Cancer Brother        LUNG   Heart disease Father     PHYSICAL EXAM: Vitals:   09/09/22 0630  BP: 137/83  Pulse: 83  Resp: 19  Temp: 98.2 F (36.8 C)  SpO2: 96%    No intake or output data in the 24 hours ending 09/09/22 0750  General:  Well appearing. No respiratory difficulty HEENT: normal Neck: supple. no JVD. Carotids 2+ bilat; no bruits. No lymphadenopathy or thryomegaly appreciated. Cor: PMI nondisplaced. Regular rate & rhythm. No rubs, gallops or murmurs. Lungs: clear Abdomen:  soft, nontender, nondistended. No hepatosplenomegaly. No bruits or masses. Good bowel sounds. Extremities: no cyanosis, clubbing, rash, edema Neuro: alert & oriented x 3, cranial nerves grossly intact. moves all 4 extremities w/o difficulty. Affect pleasant.  ECG: Normal sinus rhythm no acute changes  Results for orders placed or performed during the hospital encounter of 09/09/22 (from the past 24 hour(s))  CBC     Status: Abnormal   Collection Time: 09/09/22  6:14 AM  Result Value Ref Range   WBC 9.4 4.0 - 10.5 K/uL   RBC 4.17 3.87 - 5.11 MIL/uL   Hemoglobin 13.4 12.0 - 15.0 g/dL   HCT 40.9 81.1 - 91.4 %   MCV 95.4 80.0 - 100.0 fL   MCH 32.1 26.0 - 34.0 pg   MCHC 33.7 30.0 - 36.0 g/dL   RDW 78.2 95.6 - 21.3 %   Platelets 476 (H) 150 - 400 K/uL   nRBC 0.0 0.0 - 0.2 %  Troponin I (High Sensitivity)     Status: Abnormal   Collection Time: 09/09/22  6:14 AM  Result Value Ref Range   Troponin I (High Sensitivity) 19 (H) <18 ng/L  Comprehensive metabolic panel     Status: Abnormal   Collection Time: 09/09/22  6:14 AM  Result Value Ref Range   Sodium 135 135 - 145 mmol/L   Potassium 4.3 3.5 - 5.1 mmol/L   Chloride 101 98 - 111 mmol/L   CO2 23 22 - 32 mmol/L   Glucose, Bld 105 (H) 70 - 99 mg/dL   BUN 11 8 - 23 mg/dL   Creatinine, Ser 0.86 0.44 - 1.00 mg/dL   Calcium 9.1 8.9 - 57.8 mg/dL   Total Protein 7.3 6.5 - 8.1 g/dL   Albumin 4.3 3.5 - 5.0 g/dL   AST 33 15 - 41 U/L   ALT 37 0 - 44 U/L   Alkaline Phosphatase 79 38 - 126 U/L   Total Bilirubin 0.9 0.3 - 1.2 mg/dL   GFR, Estimated >46 >96 mL/min   Anion gap 11 5 - 15  Lipase, blood     Status: None   Collection Time: 09/09/22  6:14 AM  Result Value Ref Range   Lipase 43 11 - 51 U/L   DG Chest Port 1 View  Result Date: 09/09/2022 CLINICAL DATA:  Chest pain. EXAM: PORTABLE CHEST 1 VIEW COMPARISON:  PA Lat 07/10/2020 FINDINGS: There is mild cardiomegaly. There are prominent bilateral  epicardial fat pads. No vascular  congestion is seen. The mediastinum is normally outlined. The lungs are clear of infiltrates. There is linear scarring or atelectasis in the lateral left lower lung zone. Regional osseous structures are intact. Multiple overlying monitor wires. IMPRESSION: No acute cardiopulmonary disease. Mild cardiomegaly. Linear scarring or atelectasis in the lateral left lower lung zone. Electronically Signed   By: Almira Bar M.D.   On: 09/09/2022 06:48     ASSESSMENT AND PLAN: Unstable angina with history of Cardiac cath showing 3 vessel disease done this month in [facility].  I looked at the images there is ostial lesion in left circumflex 99%, mid LAD has 60%, and multiple high-grade lesion in the 80 to 90% and mid RCA with normal ejection fraction.  I spoke to [facility] CT surgery program and they are going to transfer the patient.  Phone number there is [phone].  Josedaniel Haye Welton Flakes

## 2022-09-09 NOTE — Discharge Summary (Signed)
Physician Discharge Summary   Patient: Evelyn Simon MRN: [id number] DOB: Jan 08, 1956  Admit date:     09/09/2022  Discharge date: 09/09/22  Discharge Physician: Emeline General   PCP: Sherrie Mustache, MD   Recommendations at discharge:   Follow-up with cardiology in 2 weeks  Discharge Diagnoses: Principal Problem:   Angina at rest Active Problems:   CAD S/P percutaneous coronary angioplasty   Unstable angina [facility])   Hospital Course:  Patient has history of known CAD with a recent history of NSTEMI treated in the local hospital in [facility], cardiac cath found patient had triple-vessel disease and CABG was proposed however patient declined.  Patient came back to [place] 5 days ago and continued to experience intermittent chest pain and eventually came to the hospital.  Workup found troponin uptrending 19> 120 repeated EKG showed no acute ST changes.  Patient was started on heparin drip in addition to aspirin and Plavix and statin which she already been taking.  Emergently consulted cardiology Dr.Shaukat Welton Flakes, who revealed patient most recent catheter result and recommend patient transferred to emergency CABG evaluation.  CT surgery at [facility] was contacted, and Dr. Alanda Slim accepted the patient.  .pz     Pain control - [place] Controlled Substance Reporting System database was reviewed. and patient was instructed, not to drive, operate heavy machinery, perform activities at heights, swimming or participation in water activities or provide baby-sitting services while on Pain, Sleep and Anxiety Medications; until their outpatient Physician has advised to do so again. Also recommended to not to take more than prescribed Pain, Sleep and Anxiety Medications.  Consultants: Cardiology Procedures performed: EKG, cardiac enzymes Disposition: [facility] for emergency CABG evaluation Diet recommendation:  Discharge Diet Orders (From admission, onward)     Start      Ordered   09/09/22 0000  Diet - low sodium heart healthy        09/09/22 1317           NPO DISCHARGE MEDICATION:   Discharge Exam: Filed Weights   09/09/22 0607  Weight: 77.1 kg   Subjective:  Eyes: PERRL, lids and conjunctivae normal ENMT: Mucous membranes are moist. Posterior pharynx clear of any exudate or lesions.Normal dentition.  Neck: normal, supple, no masses, no thyromegaly Respiratory: clear to auscultation bilaterally, no wheezing, no crackles. Normal respiratory effort. No accessory muscle use.  Cardiovascular: Regular rate and rhythm, no murmurs / rubs / gallops. No extremity edema. 2+ pedal pulses. No carotid bruits.  Abdomen: no tenderness, no masses palpated. No hepatosplenomegaly. Bowel sounds positive.  Musculoskeletal: no clubbing / cyanosis. No joint deformity upper and lower extremities. Good ROM, no contractures. Normal muscle tone.  Skin: no rashes, lesions, ulcers. No induration Neurologic: CN 2-12 grossly intact. Sensation intact, DTR normal.  Muscle strength 5/5 on both sides Psychiatric: Normal judgment and insight. Alert and oriented x 3. Normal mood.    Condition at discharge: fair  The results of significant diagnostics from this hospitalization (including imaging, microbiology, ancillary and laboratory) are listed below for reference.   Imaging Studies: DG Chest Port 1 View  Result Date: 09/09/2022 CLINICAL DATA:  Chest pain. EXAM: PORTABLE CHEST 1 VIEW COMPARISON:  PA Lat 07/10/2020 FINDINGS: There is mild cardiomegaly. There are prominent bilateral epicardial fat pads. No vascular congestion is seen. The mediastinum is normally outlined. The lungs are clear of infiltrates. There is linear scarring or atelectasis in the lateral left lower lung zone. Regional osseous structures are intact.  Multiple overlying monitor wires. IMPRESSION: No acute cardiopulmonary disease. Mild cardiomegaly. Linear scarring or atelectasis in the lateral left lower lung  zone. Electronically Signed   By: Almira Bar M.D.   On: 09/09/2022 06:48    Microbiology: Results for orders placed or performed during the hospital encounter of 07/10/20  Resp Panel by RT-PCR (Flu A&B, Covid) Nasopharyngeal Swab     Status: None   Collection Time: 07/10/20 12:37 PM   Specimen: Nasopharyngeal Swab; Nasopharyngeal(NP) swabs in vial transport medium  Result Value Ref Range Status   SARS Coronavirus 2 by RT PCR NEGATIVE NEGATIVE Final    Comment: (NOTE) SARS-CoV-2 target nucleic acids are NOT DETECTED.  The SARS-CoV-2 RNA is generally detectable in upper respiratory specimens during the acute phase of infection. The lowest concentration of SARS-CoV-2 viral copies this assay can detect is 138 copies/mL. A negative result does not preclude SARS-Cov-2 infection and should not be used as the sole basis for treatment or other patient management decisions. A negative result may occur with  improper specimen collection/handling, submission of specimen other than nasopharyngeal swab, presence of viral mutation(s) within the areas targeted by this assay, and inadequate number of viral copies(<138 copies/mL). A negative result must be combined with clinical observations, patient history, and epidemiological information. The expected result is Negative.  Fact Sheet for Patients:  BloggerCourse.com  Fact Sheet for Healthcare Providers:  SeriousBroker.it  This test is no t yet approved or cleared by the [place] FDA and  has been authorized for detection and/or diagnosis of SARS-CoV-2 by FDA under an Emergency Use Authorization (EUA). This EUA will remain  in effect (meaning this test can be used) for the duration of the COVID-19 declaration under Section 564(b)(1) of the Act, 21 U.S.C.section 360bbb-3(b)(1), unless the authorization is terminated  or revoked sooner.       Influenza A by PCR NEGATIVE NEGATIVE Final    Influenza B by PCR NEGATIVE NEGATIVE Final    Comment: (NOTE) The Xpert Xpress SARS-CoV-2/FLU/RSV plus assay is intended as an aid in the diagnosis of influenza from Nasopharyngeal swab specimens and should not be used as a sole basis for treatment. Nasal washings and aspirates are unacceptable for Xpert Xpress SARS-CoV-2/FLU/RSV testing.  Fact Sheet for Patients: BloggerCourse.com  Fact Sheet for Healthcare Providers: SeriousBroker.it  This test is not yet approved or cleared by the [place] FDA and has been authorized for detection and/or diagnosis of SARS-CoV-2 by FDA under an Emergency Use Authorization (EUA). This EUA will remain in effect (meaning this test can be used) for the duration of the COVID-19 declaration under Section 564(b)(1) of the Act, 21 U.S.C. section 360bbb-3(b)(1), unless the authorization is terminated or revoked.  Performed at [facility], [address] [place], [place] [address]     Labs: CBC: Recent Labs  Lab 09/09/22 0614  WBC 9.4  HGB 13.4  HCT 39.8  MCV 95.4  PLT 476*   Basic Metabolic Panel: Recent Labs  Lab 09/09/22 0614  NA 135  K 4.3  CL 101  CO2 23  GLUCOSE 105*  BUN 11  CREATININE 0.75  CALCIUM 9.1   Liver Function Tests: Recent Labs  Lab 09/09/22 0614  AST 33  ALT 37  ALKPHOS 79  BILITOT 0.9  PROT 7.3  ALBUMIN 4.3   CBG: No results for input(s): "GLUCAP" in the last 168 hours.  Discharge time spent: less than 30 minutes.  Signed: Emeline General, MD [facility] 09/09/2022

## 2022-09-09 NOTE — ED Notes (Signed)
XRAY  POWERSHARE  WITH  [facility] 

## 2022-09-09 NOTE — ED Notes (Signed)
EMTALA reviewed by this RN, pt ready for transfer.

## 2022-09-17 ENCOUNTER — Encounter: Payer: Self-pay | Admitting: Cardiovascular Disease

## 2022-09-17 ENCOUNTER — Ambulatory Visit (INDEPENDENT_AMBULATORY_CARE_PROVIDER_SITE_OTHER): Payer: Medicare Other | Admitting: Cardiovascular Disease

## 2022-09-17 VITALS — BP 117/63 | HR 76 | Ht 62.0 in | Wt 167.8 lb

## 2022-09-17 DIAGNOSIS — Z9861 Coronary angioplasty status: Secondary | ICD-10-CM

## 2022-09-17 DIAGNOSIS — I214 Non-ST elevation (NSTEMI) myocardial infarction: Secondary | ICD-10-CM

## 2022-09-17 DIAGNOSIS — I70213 Atherosclerosis of native arteries of extremities with intermittent claudication, bilateral legs: Secondary | ICD-10-CM

## 2022-09-17 DIAGNOSIS — I10 Essential (primary) hypertension: Secondary | ICD-10-CM

## 2022-09-17 DIAGNOSIS — I6523 Occlusion and stenosis of bilateral carotid arteries: Secondary | ICD-10-CM

## 2022-09-17 DIAGNOSIS — I2510 Atherosclerotic heart disease of native coronary artery without angina pectoris: Secondary | ICD-10-CM

## 2022-09-17 DIAGNOSIS — I739 Peripheral vascular disease, unspecified: Secondary | ICD-10-CM

## 2022-09-17 MED ORDER — BRILINTA 90 MG PO TABS
90.0000 mg | ORAL_TABLET | Freq: Two times a day (BID) | ORAL | 1 refills | Status: AC
Start: 2022-09-17 — End: ?

## 2022-09-17 NOTE — Progress Notes (Signed)
Cardiology Office Note   Date:  09/17/2022   ID:  Evelyn Simon, DOB 1955-10-03, MRN [id number]  PCP:  Sherrie Mustache, MD  Cardiologist:  Adrian Blackwater, MD      History of Present Illness: Evelyn Simon is a 67 y.o. female who presents for  Chief Complaint  Patient presents with   Follow-up    Had PCI of Osteal LCX at [facility] as had 99% reduced to 0%. Still has PCI mid RCA high grade lesion. Was recommended CABG but had NSTEMI, and underwent high risk PCI      Past Medical History:  Diagnosis Date   Arthritis    Asthma    Diverticulosis of colon    GERD (gastroesophageal reflux disease)    Headache    H/O MIGRAINES   Hypertension    Hypothyroidism      Past Surgical History:  Procedure Laterality Date   CESAREAN SECTION     X3   COLONOSCOPY WITH PROPOFOL N/A 12/21/2015   Procedure: COLONOSCOPY WITH PROPOFOL;  Surgeon: Wyline Mood, MD;  Location: [facility] ENDOSCOPY;  Service: Endoscopy;  Laterality: N/A;   COLONOSCOPY WITH PROPOFOL N/A 12/16/2016   Procedure: COLONOSCOPY WITH PROPOFOL;  Surgeon: Wyline Mood, MD;  Location: [facility] ENDOSCOPY;  Service: Gastroenterology;  Laterality: N/A;   DIAGNOSTIC LAPAROSCOPY     DILATION AND CURETTAGE OF UTERUS     ESOPHAGOGASTRODUODENOSCOPY (EGD) WITH PROPOFOL N/A 12/21/2015   Procedure: ESOPHAGOGASTRODUODENOSCOPY (EGD) WITH PROPOFOL;  Surgeon: Wyline Mood, MD;  Location: [facility] ENDOSCOPY;  Service: Endoscopy;  Laterality: N/A;   ESOPHAGOGASTRODUODENOSCOPY (EGD) WITH PROPOFOL N/A 01/02/2016   Procedure: ESOPHAGOGASTRODUODENOSCOPY (EGD) WITH PROPOFOL;  Surgeon: Midge Minium, MD;  Location: [facility] ENDOSCOPY;  Service: Endoscopy;  Laterality: N/A;   EXCISION MORTON'S NEUROMA Right    FINGER ARTHROPLASTY Left 08/21/2015   Procedure: LEFT THUMB  ARTHROPLASTY (CMC);  Surgeon: Deeann Saint, MD;  Location: [facility] ORS;  Service: Orthopedics;  Laterality: Left;   LOWER EXTREMITY ANGIOGRAPHY Bilateral 04/25/2020   Procedure: LOWER EXTREMITY ANGIOGRAPHY;   Surgeon: Renford Dills, MD;  Location: [facility] INVASIVE CV LAB;  Service: Cardiovascular;  Laterality: Bilateral;   morton neuromas removal right foot     TUBAL LIGATION     TUMMY TUCK  2014     Current Outpatient Medications  Medication Sig Dispense Refill   BRILINTA 90 MG TABS tablet Take 1 tablet (90 mg total) by mouth 2 (two) times daily. 60 tablet 1   acetaminophen (TYLENOL) 500 MG tablet Take 1,000 mg by mouth every 6 (six) hours as needed for moderate pain, mild pain or headache. (Patient not taking: Reported on 09/09/2022)     albuterol (VENTOLIN HFA) 108 (90 Base) MCG/ACT inhaler Inhale 2 puffs into the lungs every 6 (six) hours as needed for shortness of breath or wheezing.     aspirin EC 81 MG tablet Take 1 tablet (81 mg total) by mouth daily. 30 tablet 3   atorvastatin (LIPITOR) 80 MG tablet Take 80 mg by mouth daily.     BIOTIN PO Take 1 Dose by mouth daily. Liquid     bismuth subsalicylate (PEPTO BISMOL) 262 MG chewable tablet Chew 524 mg by mouth daily as needed for indigestion.     celecoxib (CELEBREX) 200 MG capsule Take 200 mg by mouth daily.     diphenhydrAMINE (BENADRYL) 12.5 MG/5ML liquid Take by mouth 4 (four) times daily as needed.     fexofenadine (ALLEGRA) 180 MG tablet Take 180 mg by mouth daily.  furosemide (LASIX) 20 MG tablet Take 20 mg by mouth daily.     gabapentin (NEURONTIN) 300 MG capsule Take 300 mg by mouth daily as needed for pain.     lisinopril (ZESTRIL) 20 MG tablet Take 20 mg by mouth daily.     methocarbamol (ROBAXIN) 500 MG tablet Take 500 mg by mouth daily as needed.     metoprolol tartrate (LOPRESSOR) 50 MG tablet Take 50 mg by mouth 2 (two) times daily.     Polyethyl Glycol-Propyl Glycol (SYSTANE) 0.4-0.3 % SOLN Place 1 drop into both eyes daily as needed (dry eyes).     Probiotic Product (PROBIOTIC PO) Take 1 capsule by mouth daily.     rosuvastatin (CRESTOR) 10 MG tablet Take 1 tablet (10 mg total) by mouth daily. 30 tablet 11   traMADol  (ULTRAM) 50 MG tablet Take 50 mg by mouth 4 (four) times daily as needed.     No current facility-administered medications for this visit.    Allergies:   Atrovent hfa [ipratropium bromide hfa], Latex, Ergotamine-caffeine, Other, Pineapple, Soy allergy, and Ergot alkaloids    Social History:   reports that she has quit smoking. Her smoking use included cigarettes. She has never used smokeless tobacco. She reports current alcohol use of about 14.0 standard drinks of alcohol per week. She reports current drug use.   Family History:  family history includes Breast cancer in her maternal aunt; Cancer in her brother; Heart disease in her father and mother; Stroke in her mother.    ROS:     Review of Systems  Constitutional: Negative.   HENT: Negative.    Eyes: Negative.   Respiratory: Negative.    Gastrointestinal: Negative.   Genitourinary: Negative.   Musculoskeletal: Negative.   Skin: Negative.   Neurological: Negative.   Endo/Heme/Allergies: Negative.   Psychiatric/Behavioral: Negative.    All other systems reviewed and are negative.     All other systems are reviewed and negative.    PHYSICAL EXAM: VS:  BP 117/63   Pulse 76   Ht 5\' 2"  (1.575 m)   Wt 167 lb 12.8 oz (76.1 kg)   SpO2 98%   BMI 30.69 kg/m  , BMI Body mass index is 30.69 kg/m. Last weight:  Wt Readings from Last 3 Encounters:  09/17/22 167 lb 12.8 oz (76.1 kg)  09/09/22 170 lb (77.1 kg)  08/02/22 171 lb 3.2 oz (77.7 kg)     Physical Exam Constitutional:      Appearance: Normal appearance.  Cardiovascular:     Rate and Rhythm: Normal rate and regular rhythm.     Heart sounds: Normal heart sounds.  Pulmonary:     Effort: Pulmonary effort is normal.     Breath sounds: Normal breath sounds.  Musculoskeletal:     Right lower leg: No edema.     Left lower leg: No edema.  Neurological:     Mental Status: She is alert.       EKG:   Recent Labs: 09/09/2022: ALT 37; BUN 11; Creatinine, Ser 0.75;  Hemoglobin 13.4; Platelets 476; Potassium 4.3; Sodium 135    Lipid Panel No results found for: "CHOL", "TRIG", "HDL", "CHOLHDL", "VLDL", "LDLCALC", "LDLDIRECT"    Other studies Reviewed: Additional studies/ records that were reviewed today include:  Review of the above records demonstrates:       No data to display            ASSESSMENT AND PLAN:    ICD-10-CM   1.  Atherosclerosis of native artery of both lower extremities with intermittent claudication (HCC)  I70.213 BRILINTA 90 MG TABS tablet    2. Benign essential hypertension  I10 BRILINTA 90 MG TABS tablet    3. CAD S/P percutaneous coronary angioplasty  I25.10 BRILINTA 90 MG TABS tablet   Z98.61     4. Bilateral carotid artery stenosis  I65.23 BRILINTA 90 MG TABS tablet    5. Peripheral vascular disease (HCC)  I73.9 BRILINTA 90 MG TABS tablet    6. NSTEMI (non-ST elevated myocardial infarction) (HCC)  I21.4 BRILINTA 90 MG TABS tablet   Had NSTEMI needing PCI osteal LCX and now needs mid RCA lesion high grade PCI.       Problem List Items Addressed This Visit       Cardiovascular and Mediastinum   Peripheral vascular disease (HCC) (Chronic)   Relevant Medications   BRILINTA 90 MG TABS tablet   CAD S/P percutaneous coronary angioplasty   Relevant Medications   BRILINTA 90 MG TABS tablet   Benign essential hypertension   Relevant Medications   BRILINTA 90 MG TABS tablet   Carotid artery disease (HCC)   Relevant Medications   BRILINTA 90 MG TABS tablet   Atherosclerosis 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 (HCC) - Primary   Relevant Medications   BRILINTA 90 MG TABS tablet   Other Visit Diagnoses     NSTEMI (non-ST elevated myocardial infarction) (HCC)       Had NSTEMI needing PCI osteal LCX and now needs mid RCA lesion high grade PCI.   Relevant Medications   BRILINTA 90 MG TABS tablet          Disposition:   Return in about 2 weeks (around 10/01/2022) for f/u and  crdiac rehab.    Total time spent: 40 minutes  Signed,  Adrian Blackwater, MD  09/17/2022 11:23 AM    [facility]

## 2022-10-03 ENCOUNTER — Ambulatory Visit: Payer: Medicare Other | Admitting: Cardiovascular Disease

## 2022-10-09 ENCOUNTER — Other Ambulatory Visit: Payer: Self-pay | Admitting: Neurology

## 2022-10-09 DIAGNOSIS — R200 Anesthesia of skin: Secondary | ICD-10-CM

## 2022-10-09 DIAGNOSIS — R9389 Abnormal findings on diagnostic imaging of other specified body structures: Secondary | ICD-10-CM

## 2022-10-09 DIAGNOSIS — R2689 Other abnormalities of gait and mobility: Secondary | ICD-10-CM

## 2022-10-11 ENCOUNTER — Ambulatory Visit (INDEPENDENT_AMBULATORY_CARE_PROVIDER_SITE_OTHER): Payer: Medicare Other | Admitting: Cardiovascular Disease

## 2022-10-11 ENCOUNTER — Encounter: Payer: Self-pay | Admitting: Cardiovascular Disease

## 2022-10-11 VITALS — BP 125/69 | HR 89 | Ht 62.0 in | Wt 170.0 lb

## 2022-10-11 DIAGNOSIS — I739 Peripheral vascular disease, unspecified: Secondary | ICD-10-CM

## 2022-10-11 DIAGNOSIS — I2510 Atherosclerotic heart disease of native coronary artery without angina pectoris: Secondary | ICD-10-CM

## 2022-10-11 DIAGNOSIS — I10 Essential (primary) hypertension: Secondary | ICD-10-CM | POA: Diagnosis not present

## 2022-10-11 DIAGNOSIS — I214 Non-ST elevation (NSTEMI) myocardial infarction: Secondary | ICD-10-CM

## 2022-10-11 DIAGNOSIS — E782 Mixed hyperlipidemia: Secondary | ICD-10-CM

## 2022-10-11 DIAGNOSIS — I70213 Atherosclerosis of native arteries of extremities with intermittent claudication, bilateral legs: Secondary | ICD-10-CM | POA: Diagnosis not present

## 2022-10-11 DIAGNOSIS — I6523 Occlusion and stenosis of bilateral carotid arteries: Secondary | ICD-10-CM

## 2022-10-11 DIAGNOSIS — Z9861 Coronary angioplasty status: Secondary | ICD-10-CM

## 2022-10-11 MED ORDER — BEMPEDOIC ACID-EZETIMIBE 180-10 MG PO TABS
180.0000 | ORAL_TABLET | Freq: Every day | ORAL | 3 refills | Status: DC
Start: 2022-10-11 — End: 2022-10-17

## 2022-10-11 MED ORDER — LISINOPRIL 2.5 MG PO TABS
2.5000 mg | ORAL_TABLET | Freq: Every day | ORAL | 11 refills | Status: DC
Start: 2022-10-11 — End: 2023-09-24

## 2022-10-11 MED ORDER — BRILINTA 90 MG PO TABS
90.0000 mg | ORAL_TABLET | Freq: Two times a day (BID) | ORAL | 1 refills | Status: DC
Start: 2022-10-11 — End: 2023-02-11

## 2022-10-11 NOTE — Progress Notes (Signed)
Cardiology Office Note   Date:  10/11/2022   ID:  Evelyn Simon, DOB 1956-01-11, MRN [id number]  PCP:  Sherrie Mustache, MD  Cardiologist:  Adrian Blackwater, MD      History of Present Illness: Evelyn Simon is a 67 y.o. female who presents for  Chief Complaint  Patient presents with   Follow-up    2 week f/u cardiac rehab    Doing well after PCI of RCA, and before LCX      Past Medical History:  Diagnosis Date   Arthritis    Asthma    Diverticulosis of colon    GERD (gastroesophageal reflux disease)    Headache    H/O MIGRAINES   Hypertension    Hypothyroidism      Past Surgical History:  Procedure Laterality Date   CESAREAN SECTION     X3   COLONOSCOPY WITH PROPOFOL N/A 12/21/2015   Procedure: COLONOSCOPY WITH PROPOFOL;  Surgeon: Wyline Mood, MD;  Location: [facility] ENDOSCOPY;  Service: Endoscopy;  Laterality: N/A;   COLONOSCOPY WITH PROPOFOL N/A 12/16/2016   Procedure: COLONOSCOPY WITH PROPOFOL;  Surgeon: Wyline Mood, MD;  Location: [facility] ENDOSCOPY;  Service: Gastroenterology;  Laterality: N/A;   DIAGNOSTIC LAPAROSCOPY     DILATION AND CURETTAGE OF UTERUS     ESOPHAGOGASTRODUODENOSCOPY (EGD) WITH PROPOFOL N/A 12/21/2015   Procedure: ESOPHAGOGASTRODUODENOSCOPY (EGD) WITH PROPOFOL;  Surgeon: Wyline Mood, MD;  Location: [facility] ENDOSCOPY;  Service: Endoscopy;  Laterality: N/A;   ESOPHAGOGASTRODUODENOSCOPY (EGD) WITH PROPOFOL N/A 01/02/2016   Procedure: ESOPHAGOGASTRODUODENOSCOPY (EGD) WITH PROPOFOL;  Surgeon: Midge Minium, MD;  Location: [facility] ENDOSCOPY;  Service: Endoscopy;  Laterality: N/A;   EXCISION MORTON'S NEUROMA Right    FINGER ARTHROPLASTY Left 08/21/2015   Procedure: LEFT THUMB  ARTHROPLASTY (CMC);  Surgeon: Deeann Saint, MD;  Location: [facility] ORS;  Service: Orthopedics;  Laterality: Left;   LOWER EXTREMITY ANGIOGRAPHY Bilateral 04/25/2020   Procedure: LOWER EXTREMITY ANGIOGRAPHY;  Surgeon: Renford Dills, MD;  Location: [facility] INVASIVE CV LAB;  Service: Cardiovascular;   Laterality: Bilateral;   morton neuromas removal right foot     TUBAL LIGATION     TUMMY TUCK  2014     Current Outpatient Medications  Medication Sig Dispense Refill   Bempedoic Acid-Ezetimibe 180-10 MG TABS Take 180 tablets by mouth daily. 30 tablet 3   lisinopril (ZESTRIL) 2.5 MG tablet Take 1 tablet (2.5 mg total) by mouth daily. 30 tablet 11   albuterol (VENTOLIN HFA) 108 (90 Base) MCG/ACT inhaler Inhale 2 puffs into the lungs every 6 (six) hours as needed for shortness of breath or wheezing.     aspirin EC 81 MG tablet Take 1 tablet (81 mg total) by mouth daily. 30 tablet 3   BIOTIN PO Take 1 Dose by mouth daily. Liquid     bismuth subsalicylate (PEPTO BISMOL) 262 MG chewable tablet Chew 524 mg by mouth daily as needed for indigestion.     BRILINTA 90 MG TABS tablet Take 1 tablet (90 mg total) by mouth 2 (two) times daily. 60 tablet 1   celecoxib (CELEBREX) 200 MG capsule Take 200 mg by mouth daily.     diphenhydrAMINE (BENADRYL) 12.5 MG/5ML liquid Take by mouth 4 (four) times daily as needed.     fexofenadine (ALLEGRA) 180 MG tablet Take 180 mg by mouth daily.     furosemide (LASIX) 20 MG tablet Take 20 mg by mouth daily.     gabapentin (NEURONTIN) 300 MG capsule Take 300 mg by mouth daily as needed  for pain.     methocarbamol (ROBAXIN) 500 MG tablet Take 500 mg by mouth daily as needed.     metoprolol tartrate (LOPRESSOR) 50 MG tablet Take 50 mg by mouth 2 (two) times daily.     Polyethyl Glycol-Propyl Glycol (SYSTANE) 0.4-0.3 % SOLN Place 1 drop into both eyes daily as needed (dry eyes).     Probiotic Product (PROBIOTIC PO) Take 1 capsule by mouth daily.     traMADol (ULTRAM) 50 MG tablet Take 50 mg by mouth 4 (four) times daily as needed.     No current facility-administered medications for this visit.    Allergies:   Atrovent hfa [ipratropium bromide hfa], Latex, Ergotamine-caffeine, Other, Pineapple, Soy allergy, and Ergot alkaloids    Social History:   reports that she  has quit smoking. Her smoking use included cigarettes. She has never used smokeless tobacco. She reports current alcohol use of about 14.0 standard drinks of alcohol per week. She reports current drug use.   Family History:  family history includes Breast cancer in her maternal aunt; Cancer in her brother; Heart disease in her father and mother; Stroke in her mother.    ROS:     Review of Systems  Constitutional: Negative.   HENT: Negative.    Eyes: Negative.   Respiratory: Negative.    Gastrointestinal: Negative.   Genitourinary: Negative.   Musculoskeletal: Negative.   Skin: Negative.   Neurological: Negative.   Endo/Heme/Allergies: Negative.   Psychiatric/Behavioral: Negative.    All other systems reviewed and are negative.     All other systems are reviewed and negative.    PHYSICAL EXAM: VS:  BP 125/69   Pulse 89   Ht 5\' 2"  (1.575 m)   Wt 170 lb (77.1 kg)   SpO2 97%   BMI 31.09 kg/m  , BMI Body mass index is 31.09 kg/m. Last weight:  Wt Readings from Last 3 Encounters:  10/11/22 170 lb (77.1 kg)  09/17/22 167 lb 12.8 oz (76.1 kg)  09/09/22 170 lb (77.1 kg)     Physical Exam Constitutional:      Appearance: Normal appearance.  Cardiovascular:     Rate and Rhythm: Normal rate and regular rhythm.     Heart sounds: Normal heart sounds.  Pulmonary:     Effort: Pulmonary effort is normal.     Breath sounds: Normal breath sounds.  Musculoskeletal:     Right lower leg: No edema.     Left lower leg: No edema.  Neurological:     Mental Status: She is alert.       EKG:   Recent Labs: 09/09/2022: ALT 37; BUN 11; Creatinine, Ser 0.75; Hemoglobin 13.4; Platelets 476; Potassium 4.3; Sodium 135    Lipid Panel No results found for: "CHOL", "TRIG", "HDL", "CHOLHDL", "VLDL", "LDLCALC", "LDLDIRECT"    Other studies Reviewed: Additional studies/ records that were reviewed today include:  Review of the above records demonstrates:       No data to display             ASSESSMENT AND PLAN:    ICD-10-CM   1. Peripheral vascular disease (HCC)  I73.9 lisinopril (ZESTRIL) 2.5 MG tablet    BRILINTA 90 MG TABS tablet    Bempedoic Acid-Ezetimibe 180-10 MG TABS    2. CAD S/P percutaneous coronary angioplasty  I25.10 lisinopril (ZESTRIL) 2.5 MG tablet   Z98.61 BRILINTA 90 MG TABS tablet    Bempedoic Acid-Ezetimibe 180-10 MG TABS    3. Benign essential hypertension  I10 lisinopril (ZESTRIL) 2.5 MG tablet    BRILINTA 90 MG TABS tablet    Bempedoic Acid-Ezetimibe 180-10 MG TABS    4. Atherosclerosis of native artery of both lower extremities with intermittent claudication (HCC)  I70.213 lisinopril (ZESTRIL) 2.5 MG tablet    BRILINTA 90 MG TABS tablet    Bempedoic Acid-Ezetimibe 180-10 MG TABS    5. PVD (peripheral vascular disease) (HCC)  I73.9 lisinopril (ZESTRIL) 2.5 MG tablet    Bempedoic Acid-Ezetimibe 180-10 MG TABS    6. Mixed hyperlipidemia  E78.2 lisinopril (ZESTRIL) 2.5 MG tablet    Bempedoic Acid-Ezetimibe 180-10 MG TABS   will place on nexlizet 180/10 has PCI mutivessel and cannot tolerate lipitor, crestor develops and joints    7. Bilateral carotid artery stenosis  I65.23 lisinopril (ZESTRIL) 2.5 MG tablet    BRILINTA 90 MG TABS tablet    Bempedoic Acid-Ezetimibe 180-10 MG TABS    8. NSTEMI (non-ST elevated myocardial infarction) (HCC)  I21.4 lisinopril (ZESTRIL) 2.5 MG tablet    BRILINTA 90 MG TABS tablet    Bempedoic Acid-Ezetimibe 180-10 MG TABS   Had NSTEMI needing PCI osteal LCX and now needs mid RCA lesion high grade PCI.       Problem List Items Addressed This Visit       Cardiovascular and Mediastinum   Peripheral vascular disease (HCC) - Primary (Chronic)   Relevant Medications   lisinopril (ZESTRIL) 2.5 MG tablet   BRILINTA 90 MG TABS tablet   Bempedoic Acid-Ezetimibe 180-10 MG TABS   CAD S/P percutaneous coronary angioplasty   Relevant Medications   lisinopril (ZESTRIL) 2.5 MG tablet   BRILINTA 90 MG TABS  tablet   Bempedoic Acid-Ezetimibe 180-10 MG TABS   Benign essential hypertension   Relevant Medications   lisinopril (ZESTRIL) 2.5 MG tablet   BRILINTA 90 MG TABS tablet   Bempedoic Acid-Ezetimibe 180-10 MG TABS   Carotid artery disease (HCC)   Relevant Medications   lisinopril (ZESTRIL) 2.5 MG tablet   BRILINTA 90 MG TABS tablet   Bempedoic Acid-Ezetimibe 180-10 MG TABS   Atherosclerosis 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 (HCC)   Relevant Medications   lisinopril (ZESTRIL) 2.5 MG tablet   BRILINTA 90 MG TABS tablet   Bempedoic Acid-Ezetimibe 180-10 MG TABS   Other Visit Diagnoses     PVD (peripheral vascular disease) (HCC)       Relevant Medications   lisinopril (ZESTRIL) 2.5 MG tablet   Bempedoic Acid-Ezetimibe 180-10 MG TABS   Mixed hyperlipidemia       will place on nexlizet 180/10 has PCI mutivessel and cannot tolerate lipitor, crestor develops and joints   Relevant Medications   lisinopril (ZESTRIL) 2.5 MG tablet   Bempedoic Acid-Ezetimibe 180-10 MG TABS   NSTEMI (non-ST elevated myocardial infarction) (HCC)       Had NSTEMI needing PCI osteal LCX and now needs mid RCA lesion high grade PCI.   Relevant Medications   lisinopril (ZESTRIL) 2.5 MG tablet   BRILINTA 90 MG TABS tablet   Bempedoic Acid-Ezetimibe 180-10 MG TABS          Disposition:   Return in about 2 months (around 12/11/2022).    Total time spent: 50 minutes  Signed,  Adrian Blackwater, MD  10/11/2022 3:08 PM    [facility]

## 2022-10-14 ENCOUNTER — Encounter: Payer: Self-pay | Admitting: Internal Medicine

## 2022-10-15 ENCOUNTER — Other Ambulatory Visit: Payer: Self-pay | Admitting: *Deleted

## 2022-10-17 ENCOUNTER — Ambulatory Visit (INDEPENDENT_AMBULATORY_CARE_PROVIDER_SITE_OTHER): Payer: Medicare Other | Admitting: Cardiovascular Disease

## 2022-10-17 ENCOUNTER — Encounter: Payer: Self-pay | Admitting: Cardiovascular Disease

## 2022-10-17 VITALS — BP 140/70 | HR 63 | Ht 62.0 in | Wt 168.0 lb

## 2022-10-17 DIAGNOSIS — I70213 Atherosclerosis of native arteries of extremities with intermittent claudication, bilateral legs: Secondary | ICD-10-CM

## 2022-10-17 DIAGNOSIS — Z01810 Encounter for preprocedural cardiovascular examination: Secondary | ICD-10-CM

## 2022-10-17 DIAGNOSIS — E782 Mixed hyperlipidemia: Secondary | ICD-10-CM

## 2022-10-17 DIAGNOSIS — I739 Peripheral vascular disease, unspecified: Secondary | ICD-10-CM | POA: Diagnosis not present

## 2022-10-17 DIAGNOSIS — I2510 Atherosclerotic heart disease of native coronary artery without angina pectoris: Secondary | ICD-10-CM

## 2022-10-17 DIAGNOSIS — I6523 Occlusion and stenosis of bilateral carotid arteries: Secondary | ICD-10-CM

## 2022-10-17 DIAGNOSIS — Z9861 Coronary angioplasty status: Secondary | ICD-10-CM

## 2022-10-17 DIAGNOSIS — I214 Non-ST elevation (NSTEMI) myocardial infarction: Secondary | ICD-10-CM

## 2022-10-17 DIAGNOSIS — I10 Essential (primary) hypertension: Secondary | ICD-10-CM

## 2022-10-17 MED ORDER — BEMPEDOIC ACID-EZETIMIBE 180-10 MG PO TABS: 1.0000 | ORAL_TABLET | Freq: Every day | ORAL | 3 refills | Status: AC

## 2022-10-17 NOTE — Progress Notes (Signed)
Cardiology Office Note   Date:  10/17/2022   ID:  Evelyn Simon, DOB 09-06-1955, MRN [id number]  PCP:  Sherrie Mustache, MD  Cardiologist:  Adrian Blackwater, MD      History of Present Illness: Evelyn Simon is a 67 y.o. female who presents for  Chief Complaint  Patient presents with   Follow-up    Surgery clearance    Feels tired      Past Medical History:  Diagnosis Date   Arthritis    Asthma    Diverticulosis of colon    GERD (gastroesophageal reflux disease)    Headache    H/O MIGRAINES   Hypertension    Hypothyroidism      Past Surgical History:  Procedure Laterality Date   CESAREAN SECTION     X3   COLONOSCOPY WITH PROPOFOL N/A 12/21/2015   Procedure: COLONOSCOPY WITH PROPOFOL;  Surgeon: Wyline Mood, MD;  Location: [facility] ENDOSCOPY;  Service: Endoscopy;  Laterality: N/A;   COLONOSCOPY WITH PROPOFOL N/A 12/16/2016   Procedure: COLONOSCOPY WITH PROPOFOL;  Surgeon: Wyline Mood, MD;  Location: [facility] ENDOSCOPY;  Service: Gastroenterology;  Laterality: N/A;   DIAGNOSTIC LAPAROSCOPY     DILATION AND CURETTAGE OF UTERUS     ESOPHAGOGASTRODUODENOSCOPY (EGD) WITH PROPOFOL N/A 12/21/2015   Procedure: ESOPHAGOGASTRODUODENOSCOPY (EGD) WITH PROPOFOL;  Surgeon: Wyline Mood, MD;  Location: [facility] ENDOSCOPY;  Service: Endoscopy;  Laterality: N/A;   ESOPHAGOGASTRODUODENOSCOPY (EGD) WITH PROPOFOL N/A 01/02/2016   Procedure: ESOPHAGOGASTRODUODENOSCOPY (EGD) WITH PROPOFOL;  Surgeon: Midge Minium, MD;  Location: [facility] ENDOSCOPY;  Service: Endoscopy;  Laterality: N/A;   EXCISION MORTON'S NEUROMA Right    FINGER ARTHROPLASTY Left 08/21/2015   Procedure: LEFT THUMB  ARTHROPLASTY (CMC);  Surgeon: Deeann Saint, MD;  Location: [facility] ORS;  Service: Orthopedics;  Laterality: Left;   LOWER EXTREMITY ANGIOGRAPHY Bilateral 04/25/2020   Procedure: LOWER EXTREMITY ANGIOGRAPHY;  Surgeon: Renford Dills, MD;  Location: [facility] INV[facility] CV [facility];  Service: Cardiovascular;  Laterality: Bilateral;   morton  neuromas removal right foot     TUBAL LIGATION     TUMMY TUCK  2014     Current Outpatient Medications  Medication Sig Dispense Refill   albuterol (VENTOLIN HFA) 108 (90 Base) MCG/ACT inhaler Inhale 2 puffs into the lungs every 6 (six) hours as needed for shortness of breath or wheezing.     aspirin EC 81 MG tablet Take 1 tablet (81 mg total) by mouth daily. 30 tablet 3   Bempedoic Acid-Ezetimibe 180-10 MG TABS Take 1 tablet by mouth daily. 30 tablet 3   BIOTIN PO Take 1 Dose by mouth daily. Liquid     bismuth subsalicylate (PEPTO BISMOL) 262 MG chewable tablet Chew 524 mg by mouth daily as needed for indigestion.     BRILINTA 90 MG TABS tablet Take 1 tablet (90 mg total) by mouth 2 (two) times daily. 60 tablet 1   celecoxib (CELEBREX) 200 MG capsule Take 200 mg by mouth daily.     diphenhydrAMINE (BENADRYL) 12.5 MG/5ML liquid Take by mouth 4 (four) times daily as needed.     fexofenadine (ALLEGRA) 180 MG tablet Take 180 mg by mouth daily.     furosemide (LASIX) 20 MG tablet Take 20 mg by mouth daily.     gabapentin (NEURONTIN) 300 MG capsule Take 300 mg by mouth daily as needed for pain.     lisinopril (ZESTRIL) 2.5 MG tablet Take 1 tablet (2.5 mg total) by mouth daily. 30 tablet 11   methocarbamol (ROBAXIN) 500 MG  tablet Take 500 mg by mouth daily as needed.     metoprolol tartrate (LOPRESSOR) 50 MG tablet Take 50 mg by mouth 2 (two) times daily.     Polyethyl Glycol-Propyl Glycol (SYSTANE) 0.4-0.3 % SOLN Place 1 drop into both eyes daily as needed (dry eyes).     Probiotic Product (PROBIOTIC PO) Take 1 capsule by mouth daily.     traMADol (ULTRAM) 50 MG tablet Take 50 mg by mouth 4 (four) times daily as needed.     No current facility-administered medications for this visit.    Allergies:   Atrovent hfa [ipratropium bromide hfa], Latex, Ergotamine-caffeine, Other, Pineapple, Soy allergy, and Ergot alkaloids    Social History:   reports that she has quit smoking. Her smoking use  included cigarettes. She has never used smokeless tobacco. She reports current alcohol use of about 14.0 standard drinks of alcohol per week. She reports current drug use.   Family History:  family history includes Breast cancer in her maternal aunt; Cancer in her brother; Heart disease in her father and mother; Stroke in her mother.    ROS:     Review of Systems  Constitutional: Negative.   HENT: Negative.    Eyes: Negative.   Respiratory: Negative.    Gastrointestinal: Negative.   Genitourinary: Negative.   Musculoskeletal: Negative.   Skin: Negative.   Neurological: Negative.   Endo/Heme/Allergies: Negative.   Psychiatric/Behavioral: Negative.    All other systems reviewed and are negative.     All other systems are reviewed and negative.    PHYSICAL EXAM: VS:  BP (!) 140/70   Pulse 63   Ht 5\' 2"  (1.575 m)   Wt 168 lb (76.2 kg)   SpO2 97%   BMI 30.73 kg/m  , BMI Body mass index is 30.73 kg/m. Last weight:  Wt Readings from Last 3 Encounters:  10/17/22 168 lb (76.2 kg)  10/11/22 170 lb (77.1 kg)  09/17/22 167 lb 12.8 oz (76.1 kg)     Physical Exam Constitutional:      Appearance: Normal appearance.  Cardiovascular:     Rate and Rhythm: Normal rate and regular rhythm.     Heart sounds: Normal heart sounds.  Pulmonary:     Effort: Pulmonary effort is normal.     Breath sounds: Normal breath sounds.  Musculoskeletal:     Right lower leg: No edema.     Left lower leg: No edema.  Neurological:     Mental Status: She is alert.       EKG:   Recent Labs: 09/09/2022: ALT 37; BUN 11; Creatinine, Ser 0.75; Hemoglobin 13.4; Platelets 476; Potassium 4.3; Sodium 135    Lipid Panel No results found for: "CHOL", "TRIG", "HDL", "CHOLHDL", "VLDL", "LDLCALC", "LDLDIRECT"    Other studies Reviewed: Additional studies/ records that were reviewed today include:  Review of the above records demonstrates:       No data to display            ASSESSMENT AND  PLAN:    ICD-10-CM   1. Preop cardiovascular exam  Z01.810    CPROCEED WITH COSMETIC SURGERY, WIYTHOUT STOPPING ASP/BRILLANTA    2. Peripheral vascular disease (HCC)  I73.9 Bempedoic Acid-Ezetimibe 180-10 MG TABS    3. CAD S/P percutaneous coronary angioplasty  I25.10 Bempedoic Acid-Ezetimibe 180-10 MG TABS   Z98.61    had multivessel PCI, high risk unable to take statins    4. Benign essential hypertension  I10 Bempedoic Acid-Ezetimibe 180-10 MG TABS  5. Atherosclerosis of native artery of both lower extremities with intermittent claudication (HCC)  I70.213 Bempedoic Acid-Ezetimibe 180-10 MG TABS    6. PVD (peripheral vascular disease) (HCC)  I73.9 Bempedoic Acid-Ezetimibe 180-10 MG TABS    7. Mixed hyperlipidemia  E78.2 Bempedoic Acid-Ezetimibe 180-10 MG TABS   will place on nexlizet 180/10 has PCI mutivessel and cannot tolerate lipitor, crestor develops and joints    8. Bilateral carotid artery stenosis  I65.23 Bempedoic Acid-Ezetimibe 180-10 MG TABS    9. NSTEMI (non-ST elevated myocardial infarction) (HCC)  I21.4 Bempedoic Acid-Ezetimibe 180-10 MG TABS   Had NSTEMI needing PCI osteal LCX and now needs mid RCA lesion high grade PCI.       Problem List Items Addressed This Visit       Cardiovascular and Mediastinum   Peripheral vascular disease (HCC) (Chronic)   Relevant Medications   Bempedoic Acid-Ezetimibe 180-10 MG TABS   CAD S/P percutaneous coronary angioplasty   Relevant Medications   Bempedoic Acid-Ezetimibe 180-10 MG TABS   Benign essential hypertension   Relevant Medications   Bempedoic Acid-Ezetimibe 180-10 MG TABS   Carotid artery disease (HCC)   Relevant Medications   Bempedoic Acid-Ezetimibe 180-10 MG TABS   Atherosclerosis 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 (HCC)   Relevant Medications   Bempedoic Acid-Ezetimibe 180-10 MG TABS   Other Visit Diagnoses     Preop cardiovascular exam    -  Primary   CPROCEED WITH COSMETIC  SURGERY, WIYTHOUT STOPPING ASP/BRILLANTA   PVD (peripheral vascular disease) (HCC)       Relevant Medications   Bempedoic Acid-Ezetimibe 180-10 MG TABS   Mixed hyperlipidemia       will place on nexlizet 180/10 has PCI mutivessel and cannot tolerate lipitor, crestor develops and joints   Relevant Medications   Bempedoic Acid-Ezetimibe 180-10 MG TABS   NSTEMI (non-ST elevated myocardial infarction) (HCC)       Had NSTEMI needing PCI osteal LCX and now needs mid RCA lesion high grade PCI.   Relevant Medications   Bempedoic Acid-Ezetimibe 180-10 MG TABS          Disposition:   Return in about 2 months (around 12/17/2022).    Total time spent: 35 minutes  Signed,  Adrian Blackwater, MD  10/17/2022 9:30 AM    [facility]

## 2022-10-18 ENCOUNTER — Ambulatory Visit
Admission: RE | Admit: 2022-10-18 | Discharge: 2022-10-18 | Disposition: A | Discharge status: Home / Self Care | Payer: Medicare Other | Source: Ambulatory Visit | Attending: Neurology | Admitting: Neurology

## 2022-10-18 ENCOUNTER — Telehealth: Payer: Self-pay

## 2022-10-18 DIAGNOSIS — R9389 Abnormal findings on diagnostic imaging of other specified body structures: Secondary | ICD-10-CM

## 2022-10-18 DIAGNOSIS — R200 Anesthesia of skin: Secondary | ICD-10-CM | POA: Diagnosis present

## 2022-10-18 DIAGNOSIS — R2689 Other abnormalities of gait and mobility: Secondary | ICD-10-CM

## 2022-10-18 MED ORDER — GADOBUTROL 1 MMOL/ML IV SOLN
7.0000 mL | Freq: Once | INTRAVENOUS | Status: AC | PRN
  Administered 2022-10-18: 7 mL via INTRAVENOUS

## 2022-10-23 ENCOUNTER — Encounter: Payer: Medicare Other | Attending: Cardiovascular Disease

## 2022-10-23 ENCOUNTER — Other Ambulatory Visit: Payer: Self-pay

## 2022-10-23 DIAGNOSIS — I252 Old myocardial infarction: Secondary | ICD-10-CM | POA: Insufficient documentation

## 2022-10-23 DIAGNOSIS — Z48812 Encounter for surgical aftercare following surgery on the circulatory system: Secondary | ICD-10-CM | POA: Insufficient documentation

## 2022-10-23 DIAGNOSIS — I214 Non-ST elevation (NSTEMI) myocardial infarction: Secondary | ICD-10-CM

## 2022-10-23 DIAGNOSIS — Z955 Presence of coronary angioplasty implant and graft: Secondary | ICD-10-CM | POA: Insufficient documentation

## 2022-10-23 NOTE — Progress Notes (Signed)
Virtual Visit completed. Patient informed on EP and RD appointment and 6 Minute walk test. Patient also informed of patient health questionnaires on My Chart. Patient Verbalizes understanding. Visit diagnosis can be found in [facility] 09/09/2022.

## 2022-10-24 NOTE — Telephone Encounter (Signed)
Notified. 

## 2022-10-28 VITALS — Ht 63.0 in | Wt 170.3 lb

## 2022-10-28 DIAGNOSIS — Z955 Presence of coronary angioplasty implant and graft: Secondary | ICD-10-CM | POA: Diagnosis present

## 2022-10-28 DIAGNOSIS — I252 Old myocardial infarction: Secondary | ICD-10-CM | POA: Diagnosis not present

## 2022-10-28 DIAGNOSIS — I214 Non-ST elevation (NSTEMI) myocardial infarction: Secondary | ICD-10-CM | POA: Diagnosis present

## 2022-10-28 DIAGNOSIS — Z48812 Encounter for surgical aftercare following surgery on the circulatory system: Secondary | ICD-10-CM | POA: Diagnosis present

## 2022-10-28 NOTE — Patient Instructions (Addendum)
Patient Instructions  Patient Details  Name: Evelyn Simon MRN: [id number] Date of Birth: [birth year] Referring Provider:  Laurier Nancy, MD  Below are your personal goals for exercise, nutrition, and risk factors. Our goal is to help you stay on track towards obtaining and maintaining these goals. We will be discussing your progress on these goals with you throughout the program.  Initial Exercise Prescription:  Initial Exercise Prescription - 10/28/22 1300       Date of Initial Exercise RX and Referring Provider   Date 10/28/22    Referring Provider Dr. Adrian Blackwater, MD      Oxygen   Maintain Oxygen Saturation 88% or higher      Treadmill   MPH 2    Grade 0    Minutes 15    METs 2.53      NuStep   Level 2    SPM 80    Minutes 15    METs 2.54      Biostep-RELP   Level 2    SPM 50    Minutes 15    METs 2.54      Prescription Details   Frequency (times per week) 3    Duration Progress to 30 minutes of continuous aerobic without signs/symptoms of physical distress      Intensity   THRR 40-80% of Max Heartrate 101-136    Ratings of Perceived Exertion 11-13    Perceived Dyspnea 0-4      Progression   Progression Continue to progress workloads to maintain intensity without signs/symptoms of physical distress.      Resistance Training   Training Prescription Yes    Weight 3 lb    Reps 10-15             Exercise Goals: Frequency: Be able to perform aerobic exercise two to three times per week in program working toward 2-5 days per week of home exercise.  Intensity: Work with a perceived exertion of 11 (fairly light) - 15 (hard) while following your exercise prescription.  We will make changes to your prescription with you as you progress through the program.   Duration: Be able to do 30 to 45 minutes of continuous aerobic exercise in addition to a 5 minute warm-up and a 5 minute cool-down routine.   Nutrition Goals: Your personal nutrition goals will be  established when you do your nutrition analysis with the dietician.  The following are general nutrition guidelines to follow: Cholesterol < 200mg /day Sodium < 1500mg /day Fiber: Women over 50 yrs - 21 grams per day  Personal Goals:  Personal Goals and Risk Factors at Admission - 10/23/22 1344       Core Components/Risk Factors/Patient Goals on Admission    Weight Management Weight Loss;Yes    Intervention Weight Management: Develop a combined nutrition and exercise program designed to reach desired caloric intake, while maintaining appropriate intake of nutrient and fiber, sodium and fats, and appropriate energy expenditure required for the weight goal.;Weight Management: Provide education and appropriate resources to help participant work on and attain dietary goals.;Weight Management/Obesity: Establish reasonable short term and long term weight goals.;Obesity: Provide education and appropriate resources to help participant work on and attain dietary goals.    Expected Outcomes Short Term: Continue to assess and modify interventions until short term weight is achieved;Long Term: Adherence to nutrition and physical activity/exercise program aimed toward attainment of established weight goal;Weight Loss: Understanding of general recommendations for a balanced deficit meal plan, which promotes 1-2 lb weight  loss per week and includes a negative energy balance of [phone] kcal/d;Understanding recommendations for meals to include 15-35% energy as protein, 25-35% energy from fat, 35-60% energy from carbohydrates, less than 200mg  of dietary cholesterol, 20-35 gm of total fiber daily;Understanding of distribution of calorie intake throughout the day with the consumption of 4-5 meals/snacks    Hypertension Yes    Intervention Provide education on lifestyle modifcations including regular physical activity/exercise, weight management, moderate sodium restriction and increased consumption of fresh fruit,  vegetables, and low fat dairy, alcohol moderation, and smoking cessation.;Monitor prescription use compliance.    Expected Outcomes Short Term: Continued assessment and intervention until BP is < 140/65mm HG in hypertensive participants. < 130/69mm HG in hypertensive participants with diabetes, heart failure or chronic kidney disease.;Long Term: Maintenance of blood pressure at goal levels.    Lipids Yes    Intervention Provide education and support for participant on nutrition & aerobic/resistive exercise along with prescribed medications to achieve LDL 70mg , HDL >40mg .    Expected Outcomes Short Term: Participant states understanding of desired cholesterol values and is compliant with medications prescribed. Participant is following exercise prescription and nutrition guidelines.;Long Term: Cholesterol controlled with medications as prescribed, with individualized exercise RX and with personalized nutrition plan. Value goals: LDL < 70mg , HDL > 40 mg.            Exercise Goals and Review:  Exercise Goals     Row Name 10/28/22 1319             Exercise Goals   Increase Physical Activity Yes       Intervention Provide advice, education, support and counseling about physical activity/exercise needs.;Develop an individualized exercise prescription for aerobic and resistive training based on initial evaluation findings, risk stratification, comorbidities and participant's personal goals.       Expected Outcomes Long Term: Add in home exercise to make exercise part of routine and to increase amount of physical activity.;Short Term: Attend rehab on a regular basis to increase amount of physical activity.;Long Term: Exercising regularly at least 3-5 days a week.       Increase Strength and Stamina Yes       Intervention Provide advice, education, support and counseling about physical activity/exercise needs.;Develop an individualized exercise prescription for aerobic and resistive training based  on initial evaluation findings, risk stratification, comorbidities and participant's personal goals.       Expected Outcomes Short Term: Increase workloads from initial exercise prescription for resistance, speed, and METs.;Short Term: Perform resistance training exercises routinely during rehab and add in resistance training at home;Long Term: Improve cardiorespiratory fitness, muscular endurance and strength as measured by increased METs and functional capacity ( )       Able to understand and use rate of perceived exertion (RPE) scale Yes       Intervention Provide education and explanation on how to use RPE scale       Expected Outcomes Short Term: Able to use RPE daily in rehab to express subjective intensity level;Long Term:  Able to use RPE to guide intensity level when exercising independently       Able to understand and use Dyspnea scale Yes       Intervention Provide education and explanation on how to use Dyspnea scale       Expected Outcomes Short Term: Able to use Dyspnea scale daily in rehab to express subjective sense of shortness of breath during exertion;Long Term: Able to use Dyspnea scale to guide intensity level when exercising  independently       Knowledge and understanding of Target Heart Rate Range (THRR) Yes       Intervention Provide education and explanation of THRR including how the numbers were predicted and where they are located for reference       Expected Outcomes Short Term: Able to state/look up THRR;Long Term: Able to use THRR to govern intensity when exercising independently;Short Term: Able to use daily as guideline for intensity in rehab       Able to check pulse independently Yes       Intervention Provide education and demonstration on how to check pulse in carotid and radial arteries.;Review the importance of being able to check your own pulse for safety during independent exercise       Expected Outcomes Short Term: Able to explain why pulse checking is  important during independent exercise;Long Term: Able to check pulse independently and accurately       Understanding of Exercise Prescription Yes       Intervention Provide education, explanation, and written materials on patient's individual exercise prescription       Expected Outcomes Short Term: Able to explain program exercise prescription;Long Term: Able to explain home exercise prescription to exercise independently

## 2022-10-28 NOTE — Progress Notes (Signed)
Cardiac Individual Treatment Plan  Patient Details  Name: Evelyn Simon MRN: [id number] Date of Birth: 1955/12/21 Referring Provider:   Flowsheet Row Cardiac Rehab from 10/28/2022 in [facility] Cardiac and Pulmonary Rehab  Referring Provider Dr. Adrian Blackwater, MD       Initial Encounter Date:  Flowsheet Row Cardiac Rehab from 10/28/2022 in [facility] Cardiac and Pulmonary Rehab  Date 10/28/22       Visit Diagnosis: NSTEMI (non-ST elevated myocardial infarction) [facility])  Patient's Home Medications on Admission:  Current Outpatient Medications:    albuterol (VENTOLIN HFA) 108 (90 Base) MCG/ACT inhaler, Inhale 2 puffs into the lungs every 6 (six) hours as needed for shortness of breath or wheezing., Disp: , Rfl:    aspirin EC 81 MG tablet, Take 1 tablet (81 mg total) by mouth daily., Disp: 30 tablet, Rfl: 3   aspirin EC 81 MG tablet, Take 1 tablet by mouth daily. (Patient not taking: Reported on 10/23/2022), Disp: , Rfl:    atorvastatin (LIPITOR) 80 MG tablet, Take 1 tablet by mouth daily., Disp: , Rfl:    azelastine (ASTELIN) 0.1 % nasal spray, Place into both nostrils., Disp: , Rfl:    azelastine (OPTIVAR) 0.05 % ophthalmic solution, Apply to eye., Disp: , Rfl:    Bempedoic Acid-Ezetimibe 180-10 MG TABS, Take 1 tablet by mouth daily., Disp: 30 tablet, Rfl: 3   BIOTIN PO, Take 1 Dose by mouth daily. Liquid (Patient not taking: Reported on 10/23/2022), Disp: , Rfl:    bismuth subsalicylate (PEPTO BISMOL) 262 MG chewable tablet, Chew 524 mg by mouth daily as needed for indigestion., Disp: , Rfl:    BRILINTA 90 MG TABS tablet, Take 1 tablet (90 mg total) by mouth 2 (two) times daily., Disp: 60 tablet, Rfl: 1   celecoxib (CELEBREX) 200 MG capsule, Take 200 mg by mouth daily. (Patient not taking: Reported on 10/23/2022), Disp: , Rfl:    cetirizine (ZYRTEC) 10 MG tablet, Take 1 tablet by mouth daily., Disp: , Rfl:    diphenhydrAMINE (BENADRYL) 12.5 MG/5ML liquid, Take by mouth 4 (four) times daily as  needed., Disp: , Rfl:    diphenhydrAMINE (BENADRYL) 12.5 MG/5ML liquid, Take by mouth. (Patient not taking: Reported on 10/23/2022), Disp: , Rfl:    fexofenadine (ALLEGRA) 180 MG tablet, Take 180 mg by mouth daily. (Patient not taking: Reported on 10/23/2022), Disp: , Rfl:    fluticasone (FLONASE) 50 MCG/ACT nasal spray, Place into both nostrils., Disp: , Rfl:    furosemide (LASIX) 20 MG tablet, Take 20 mg by mouth daily. (Patient not taking: Reported on 10/23/2022), Disp: , Rfl:    furosemide (LASIX) 20 MG tablet, Take 1 tablet by mouth daily. (Patient not taking: Reported on 10/23/2022), Disp: , Rfl:    gabapentin (NEURONTIN) 300 MG capsule, Take 300 mg by mouth daily as needed for pain., Disp: , Rfl:    gabapentin (NEURONTIN) 300 MG capsule, Take by mouth., Disp: , Rfl:    levothyroxine (SYNTHROID) 50 MCG tablet, Take 50 mcg by mouth daily., Disp: , Rfl:    lisinopril (ZESTRIL) 2.5 MG tablet, Take 1 tablet (2.5 mg total) by mouth daily., Disp: 30 tablet, Rfl: 11   lisinopril (ZESTRIL) 20 MG tablet, Take by mouth. (Patient not taking: Reported on 10/23/2022), Disp: , Rfl:    methocarbamol (ROBAXIN) 500 MG tablet, Take 500 mg by mouth daily as needed., Disp: , Rfl:    metoprolol succinate (TOPROL-XL) 50 MG 24 hr tablet, Take 1 tablet by mouth daily. (Patient not taking: Reported on  10/23/2022), Disp: , Rfl:    metoprolol tartrate (LOPRESSOR) 50 MG tablet, Take 50 mg by mouth 2 (two) times daily., Disp: , Rfl:    montelukast (SINGULAIR) 10 MG tablet, Take 10 mg by mouth daily., Disp: , Rfl:    NON FORMULARY, Take by mouth. (Patient not taking: Reported on 10/23/2022), Disp: , Rfl:    Polyethyl Glycol-Propyl Glycol (SYSTANE) 0.4-0.3 % SOLN, Place 1 drop into both eyes daily as needed (dry eyes)., Disp: , Rfl:    Probiotic Product (PROBIOTIC PO), Take 1 capsule by mouth daily. (Patient not taking: Reported on 10/23/2022), Disp: , Rfl:    ticagrelor (BRILINTA) 90 MG TABS tablet, Take by mouth., Disp: , Rfl:     traMADol (ULTRAM) 50 MG tablet, Take 50 mg by mouth 4 (four) times daily as needed., Disp: , Rfl:   Past Medical History: Past Medical History:  Diagnosis Date   Arthritis    Asthma    Diverticulosis of colon    GERD (gastroesophageal reflux disease)    Headache    H/O MIGRAINES   Hypertension    Hypothyroidism     Tobacco Use: Social History   Tobacco Use  Smoking Status Former   Current packs/day: 0.00   Types: Cigarettes   Quit date: 2003   Years since quitting: 21.8  Smokeless Tobacco Never    Labs: Review Flowsheet        No data to display           Exercise Target Goals: Exercise Program Goal: Individual exercise prescription set using results from initial 6 min walk test and THRR while considering  patient's activity barriers and safety.   Exercise Prescription Goal: Initial exercise prescription builds to 30-45 minutes a day of aerobic activity, 2-3 days per week.  Home exercise guidelines will be given to patient during program as part of exercise prescription that the participant will acknowledge.   Education: Aerobic Exercise: - Group verbal and visual presentation on the components of exercise prescription. Introduces F.I.T.T principle from ACSM for exercise prescriptions.  Reviews F.I.T.T. principles of aerobic exercise including progression. Written material given at graduation. Flowsheet Row Cardiac Rehab from 10/28/2022 in [facility] Cardiac and Pulmonary Rehab  Education need identified 10/28/22       Education: Resistance Exercise: - Group verbal and visual presentation on the components of exercise prescription. Introduces F.I.T.T principle from ACSM for exercise prescriptions  Reviews F.I.T.T. principles of resistance exercise including progression. Written material given at graduation.    Education: Exercise & Equipment Safety: - Individual verbal instruction and demonstration of equipment use and safety with use of the  equipment. Flowsheet Row Cardiac Rehab from 10/28/2022 in [facility] Cardiac and Pulmonary Rehab  Date 10/28/22  Educator NT  Instruction Review Code 1- Verbalizes Understanding       Education: Exercise Physiology & General Exercise Guidelines: - Group verbal and written instruction with models to review the exercise physiology of the cardiovascular system and associated critical values. Provides general exercise guidelines with specific guidelines to those with heart or lung disease.    Education: Flexibility, Balance, Mind/Body Relaxation: - Group verbal and visual presentation with interactive activity on the components of exercise prescription. Introduces F.I.T.T principle from ACSM for exercise prescriptions. Reviews F.I.T.T. principles of flexibility and balance exercise training including progression. Also discusses the mind body connection.  Reviews various relaxation techniques to help reduce and manage stress (i.e. Deep breathing, progressive muscle relaxation, and visualization). Balance handout provided to take home. Written material given  at graduation.   Activity Barriers & Risk Stratification:  Activity Barriers & Cardiac Risk Stratification - 10/28/22 1318       Activity Barriers & Cardiac Risk Stratification   Activity Barriers Joint Problems;Other (comment)    Comments Fibromyalgia, foot pain, groin pain, trigger thumb    Cardiac Risk Stratification High             6 Minute Walk:  6 Minute Walk     Row Name 10/28/22 1317         6 Minute Walk   Phase Initial     Distance 1125 feet     Walk Time 6 minutes     # of Rest Breaks 0     MPH 2.13     METS 2.54     RPE 11     Perceived Dyspnea  1     VO2 Peak 8.9     Symptoms Yes (comment)     Comments L knee pain 5/10     Resting HR 66 bpm     Resting BP 136/70     Resting Oxygen Saturation  97 %     Exercise Oxygen Saturation  during 6 min walk 95 %     Max Ex. HR 86 bpm     Max Ex. BP 144/72     2  Minute Post BP 140/68              Oxygen Initial Assessment:   Oxygen Re-Evaluation:   Oxygen Discharge (Final Oxygen Re-Evaluation):   Initial Exercise Prescription:  Initial Exercise Prescription - 10/28/22 1300       Date of Initial Exercise RX and Referring Provider   Date 10/28/22    Referring Provider Dr. Adrian Blackwater, MD      Oxygen   Maintain Oxygen Saturation 88% or higher      Treadmill   MPH 2    Grade 0    Minutes 15    METs 2.53      NuStep   Level 2    SPM 80    Minutes 15    METs 2.54      Biostep-RELP   Level 2    SPM 50    Minutes 15    METs 2.54      Prescription Details   Frequency (times per week) 3    Duration Progress to 30 minutes of continuous aerobic without signs/symptoms of physical distress      Intensity   THRR 40-80% of Max Heartrate 101-136    Ratings of Perceived Exertion 11-13    Perceived Dyspnea 0-4      Progression   Progression Continue to progress workloads to maintain intensity without signs/symptoms of physical distress.      Resistance Training   Training Prescription Yes    Weight 3 lb    Reps 10-15             Perform Capillary Blood Glucose checks as needed.  Exercise Prescription Changes:   Exercise Prescription Changes     Row Name 10/28/22 1300             Response to Exercise   Blood Pressure (Admit) 136/70       Blood Pressure (Exercise) 144/72       Blood Pressure (Exit) 140/68       Heart Rate (Admit) 66 bpm       Heart Rate (Exercise) 86 bpm       Heart  Rate (Exit) 68 bpm       Oxygen Saturation (Admit) 97 %       Oxygen Saturation (Exercise) 95 %       Rating of Perceived Exertion (Exercise) 11       Perceived Dyspnea (Exercise) 1       Symptoms L knee pain 5/10       Comments Results                Exercise Comments:   Exercise Goals and Review:   Exercise Goals     Row Name 10/28/22 1319             Exercise Goals   Increase Physical Activity  Yes       Intervention Provide advice, education, support and counseling about physical activity/exercise needs.;Develop an individualized exercise prescription for aerobic and resistive training based on initial evaluation findings, risk stratification, comorbidities and participant's personal goals.       Expected Outcomes Long Term: Add in home exercise to make exercise part of routine and to increase amount of physical activity.;Short Term: Attend rehab on a regular basis to increase amount of physical activity.;Long Term: Exercising regularly at least 3-5 days a week.       Increase Strength and Stamina Yes       Intervention Provide advice, education, support and counseling about physical activity/exercise needs.;Develop an individualized exercise prescription for aerobic and resistive training based on initial evaluation findings, risk stratification, comorbidities and participant's personal goals.       Expected Outcomes Short Term: Increase workloads from initial exercise prescription for resistance, speed, and METs.;Short Term: Perform resistance training exercises routinely during rehab and add in resistance training at home;Long Term: Improve cardiorespiratory fitness, muscular endurance and strength as measured by increased METs and functional capacity ( )       Able to understand and use rate of perceived exertion (RPE) scale Yes       Intervention Provide education and explanation on how to use RPE scale       Expected Outcomes Short Term: Able to use RPE daily in rehab to express subjective intensity level;Long Term:  Able to use RPE to guide intensity level when exercising independently       Able to understand and use Dyspnea scale Yes       Intervention Provide education and explanation on how to use Dyspnea scale       Expected Outcomes Short Term: Able to use Dyspnea scale daily in rehab to express subjective sense of shortness of breath during exertion;Long Term: Able to use  Dyspnea scale to guide intensity level when exercising independently       Knowledge and understanding of Target Heart Rate Range (THRR) Yes       Intervention Provide education and explanation of THRR including how the numbers were predicted and where they are located for reference       Expected Outcomes Short Term: Able to state/look up THRR;Long Term: Able to use THRR to govern intensity when exercising independently;Short Term: Able to use daily as guideline for intensity in rehab       Able to check pulse independently Yes       Intervention Provide education and demonstration on how to check pulse in carotid and radial arteries.;Review the importance of being able to check your own pulse for safety during independent exercise       Expected Outcomes Short Term: Able to explain why pulse checking is  important during independent exercise;Long Term: Able to check pulse independently and accurately       Understanding of Exercise Prescription Yes       Intervention Provide education, explanation, and written materials on patient's individual exercise prescription       Expected Outcomes Short Term: Able to explain program exercise prescription;Long Term: Able to explain home exercise prescription to exercise independently                Exercise Goals Re-Evaluation :   Discharge Exercise Prescription (Final Exercise Prescription Changes):  Exercise Prescription Changes - 10/28/22 1300       Response to Exercise   Blood Pressure (Admit) 136/70    Blood Pressure (Exercise) 144/72    Blood Pressure (Exit) 140/68    Heart Rate (Admit) 66 bpm    Heart Rate (Exercise) 86 bpm    Heart Rate (Exit) 68 bpm    Oxygen Saturation (Admit) 97 %    Oxygen Saturation (Exercise) 95 %    Rating of Perceived Exertion (Exercise) 11    Perceived Dyspnea (Exercise) 1    Symptoms L knee pain 5/10    Comments Results             Nutrition:  Target Goals: Understanding of nutrition  guidelines, daily intake of sodium 1500mg , cholesterol 200mg , calories 30% from fat and 7% or less from saturated fats, daily to have 5 or more servings of fruits and vegetables.  Education: All About Nutrition: -Group instruction provided by verbal, written material, interactive activities, discussions, models, and posters to present general guidelines for heart healthy nutrition including fat, fiber, MyPlate, the role of sodium in heart healthy nutrition, utilization of the nutrition label, and utilization of this knowledge for meal planning. Follow up email sent as well. Written material given at graduation.   Biometrics:  Pre Biometrics - 10/28/22 1319       Pre Biometrics   Height 5\' 3"  (1.6 m)    Weight 170 lb 4.8 oz (77.2 kg)    Waist Circumference 36 inches    Hip Circumference 41 inches    Waist to Hip Ratio 0.88 %    BMI (Calculated) 30.18    Single Leg Stand 8.05 seconds              Nutrition Therapy Plan and Nutrition Goals:  Nutrition Therapy & Goals - 10/28/22 1013       Nutrition Therapy   RD appointment deferred Yes      Intervention Plan   Intervention Prescribe, educate and counsel regarding individualized specific dietary modifications aiming towards targeted core components such as weight, hypertension, lipid management, diabetes, heart failure and other comorbidities.    Expected Outcomes Short Term Goal: Understand basic principles of dietary content, such as calories, fat, sodium, cholesterol and nutrients.;Short Term Goal: A plan has been developed with personal nutrition goals set during dietitian appointment.;Long Term Goal: Adherence to prescribed nutrition plan.             Nutrition Assessments:  MEDIFICTS Score Key: >=70 Need to make dietary changes  40-70 Heart Healthy Diet <= 40 Therapeutic Level Cholesterol Diet  Flowsheet Row Cardiac Rehab from 10/28/2022 in [facility] Cardiac and Pulmonary Rehab  Picture Your Plate Total Score on  Admission 51      Picture Your Plate Scores: <46 Unhealthy dietary pattern with much room for improvement. 41-50 Dietary pattern unlikely to meet recommendations for good health and room for improvement. 51-60 More healthful dietary pattern,  with some room for improvement.  >60 Healthy dietary pattern, although there may be some specific behaviors that could be improved.    Nutrition Goals Re-Evaluation:   Nutrition Goals Discharge (Final Nutrition Goals Re-Evaluation):   Psychosocial: Target Goals: Acknowledge presence or absence of significant depression and/or stress, maximize coping skills, provide positive support system. Participant is able to verbalize types and ability to use techniques and skills needed for reducing stress and depression.   Education: Stress, Anxiety, and Depression - Group verbal and visual presentation to define topics covered.  Reviews how body is impacted by stress, anxiety, and depression.  Also discusses healthy ways to reduce stress and to treat/manage anxiety and depression.  Written material given at graduation.   Education: Sleep Hygiene -Provides group verbal and written instruction about how sleep can affect your health.  Define sleep hygiene, discuss sleep cycles and impact of sleep habits. Review good sleep hygiene tips.    Initial Review & Psychosocial Screening:  Initial Psych Review & Screening - 10/23/22 1345       Initial Review   Current issues with Current Sleep Concerns      Family Dynamics   Good Support System? Yes    Comments She has a hard time sleeping alot. Her friend Olegario Messier is a good source for support.      Barriers   Psychosocial barriers to participate in program The patient should benefit from training in stress management and relaxation.      Screening Interventions   Interventions Encouraged to exercise;Provide feedback about the scores to participant;To provide support and resources with identified psychosocial  needs    Expected Outcomes Short Term goal: Utilizing psychosocial counselor, staff and physician to assist with identification of specific Stressors or current issues interfering with healing process. Setting desired goal for each stressor or current issue identified.;Long Term Goal: Stressors or current issues are controlled or eliminated.;Short Term goal: Identification and review with participant of any Quality of Life or Depression concerns found by scoring the questionnaire.;Long Term goal: The participant improves quality of Life and PHQ9 Scores as seen by post scores and/or verbalization of changes             Quality of Life Scores:   Quality of Life - 10/28/22 1304       Quality of Life   Select Quality of Life      Quality of Life Scores   Health/Function Pre 19.7 %    Socioeconomic Pre 23.69 %    Psych/Spiritual Pre 22.07 %    Family Pre 27 %    GLOBAL Pre 21.99 %            Scores of 19 and below usually indicate a poorer quality of life in these areas.  A difference of  2-3 points is a clinically meaningful difference.  A difference of 2-3 points in the total score of the Quality of Life Index has been associated with significant improvement in overall quality of life, self-image, physical symptoms, and general health in studies assessing change in quality of life.  PHQ-9: Review Flowsheet       10/28/2022  Depression screen PHQ 2/9  Decreased Interest 0  Down, Depressed, Hopeless 0  PHQ - 2 Score 0  Altered sleeping 1  Tired, decreased energy 1  Change in appetite 0  Feeling bad or failure about yourself  0  Trouble concentrating 1  Moving slowly or fidgety/restless 0  Suicidal thoughts 0  PHQ-9 Score 3  Difficult doing work/chores Somewhat difficult    Details           Interpretation of Total Score  Total Score Depression Severity:  1-4 = Minimal depression, 5-9 = Mild depression, 10-14 = Moderate depression, 15-19 = Moderately severe  depression, 20-27 = Severe depression   Psychosocial Evaluation and Intervention:  Psychosocial Evaluation - 10/23/22 1346       Psychosocial Evaluation & Interventions   Interventions Relaxation education;Stress management education;Encouraged to exercise with the program and follow exercise prescription    Comments She has a hard time sleeping alot. Her friend Olegario Messier is a good source for support.    Expected Outcomes Short: Start [facility] to help with mood. Long: Maintain a healthy mental state    Continue Psychosocial Services  Follow up required by staff             Psychosocial Re-Evaluation:   Psychosocial Discharge (Final Psychosocial Re-Evaluation):   Vocational Rehabilitation: Provide vocational rehab assistance to qualifying candidates.   Vocational Rehab Evaluation & Intervention:   Education: Education Goals: Education classes will be provided on a variety of topics geared toward better understanding of heart health and risk factor modification. Participant will state understanding/return demonstration of topics presented as noted by education test scores.  Learning Barriers/Preferences:  Learning Barriers/Preferences - 10/23/22 1344       Learning Barriers/Preferences   Learning Barriers None    Learning Preferences None             General Cardiac Education Topics:  AED/CPR: - Group verbal and written instruction with the use of models to demonstrate the basic use of the AED with the basic ABC's of resuscitation.   Anatomy and Cardiac Procedures: - Group verbal and visual presentation and models provide information about basic cardiac anatomy and function. Reviews the testing methods done to diagnose heart disease and the outcomes of the test results. Describes the treatment choices: Medical Management, Angioplasty, or Coronary Bypass Surgery for treating various heart conditions including Myocardial Infarction, Angina, Valve Disease, and Cardiac  Arrhythmias.  Written material given at graduation.   Medication Safety: - Group verbal and visual instruction to review commonly prescribed medications for heart and lung disease. Reviews the medication, class of the drug, and side effects. Includes the steps to properly store meds and maintain the prescription regimen.  Written material given at graduation.   Intimacy: - Group verbal instruction through game format to discuss how heart and lung disease can affect sexual intimacy. Written material given at graduation..   Know Your Numbers and Heart Failure: - Group verbal and visual instruction to discuss disease risk factors for cardiac and pulmonary disease and treatment options.  Reviews associated critical values for Overweight/Obesity, Hypertension, Cholesterol, and Diabetes.  Discusses basics of heart failure: signs/symptoms and treatments.  Introduces Heart Failure Zone chart for action plan for heart failure.  Written material given at graduation.   Infection Prevention: - Provides verbal and written material to individual with discussion of infection control including proper hand washing and proper equipment cleaning during exercise session. Flowsheet Row Cardiac Rehab from 10/28/2022 in [facility] Cardiac and Pulmonary Rehab  Date 10/28/22  Educator NT  Instruction Review Code 1- Verbalizes Understanding       Falls Prevention: - Provides verbal and written material to individual with discussion of falls prevention and safety. Flowsheet Row Cardiac Rehab from 10/28/2022 in [facility] Cardiac and Pulmonary Rehab  Date 10/28/22  Educator NT  Instruction Review Code 1- Verbalizes Understanding  Other: -Provides group and verbal instruction on various topics (see comments)   Knowledge Questionnaire Score:  Knowledge Questionnaire Score - 10/28/22 1306       Knowledge Questionnaire Score   Pre Score 25/26             Core Components/Risk Factors/Patient Goals at  Admission:  Personal Goals and Risk Factors at Admission - 10/23/22 1344       Core Components/Risk Factors/Patient Goals on Admission    Weight Management Weight Loss;Yes    Intervention Weight Management: Develop a combined nutrition and exercise program designed to reach desired caloric intake, while maintaining appropriate intake of nutrient and fiber, sodium and fats, and appropriate energy expenditure required for the weight goal.;Weight Management: Provide education and appropriate resources to help participant work on and attain dietary goals.;Weight Management/Obesity: Establish reasonable short term and long term weight goals.;Obesity: Provide education and appropriate resources to help participant work on and attain dietary goals.    Expected Outcomes Short Term: Continue to assess and modify interventions until short term weight is achieved;Long Term: Adherence to nutrition and physical activity/exercise program aimed toward attainment of established weight goal;Weight Loss: Understanding of general recommendations for a balanced deficit meal plan, which promotes 1-2 lb weight loss per week and includes a negative energy balance of [id number] kcal/d;Understanding recommendations for meals to include 15-35% energy as protein, 25-35% energy from fat, 35-60% energy from carbohydrates, less than 200mg  of dietary cholesterol, 20-35 gm of total fiber daily;Understanding of distribution of calorie intake throughout the day with the consumption of 4-5 meals/snacks    Hypertension Yes    Intervention Provide education on lifestyle modifcations including regular physical activity/exercise, weight management, moderate sodium restriction and increased consumption of fresh fruit, vegetables, and low fat dairy, alcohol moderation, and smoking cessation.;Monitor prescription use compliance.    Expected Outcomes Short Term: Continued assessment and intervention until BP is < 140/52mm HG in hypertensive  participants. < 130/57mm HG in hypertensive participants with diabetes, heart failure or chronic kidney disease.;Long Term: Maintenance of blood pressure at goal levels.    Lipids Yes    Intervention Provide education and support for participant on nutrition & aerobic/resistive exercise along with prescribed medications to achieve LDL 70mg , HDL >40mg .    Expected Outcomes Short Term: Participant states understanding of desired cholesterol values and is compliant with medications prescribed. Participant is following exercise prescription and nutrition guidelines.;Long Term: Cholesterol controlled with medications as prescribed, with individualized exercise RX and with personalized nutrition plan. Value goals: LDL < 70mg , HDL > 40 mg.             Education:Diabetes - Individual verbal and written instruction to review signs/symptoms of diabetes, desired ranges of glucose level fasting, after meals and with exercise. Acknowledge that pre and post exercise glucose checks will be done for 3 sessions at entry of program.   Core Components/Risk Factors/Patient Goals Review:    Core Components/Risk Factors/Patient Goals at Discharge (Final Review):    ITP Comments:  ITP Comments     Row Name 10/23/22 1353 10/28/22 1013         ITP Comments Virtual Visit completed. Patient informed on EP and RD appointment and 6 Minute walk test. Patient also informed of patient health questionnaires on My Chart. Patient Verbalizes understanding. Visit diagnosis can be found in [facility] 09/09/2022. Completed and gym orientation. Initial ITP created and sent for review to Dr. Bethann Punches, Medical Director.  Comments: Initial ITP

## 2022-10-30 ENCOUNTER — Ambulatory Visit: Payer: Medicare Other

## 2022-10-31 ENCOUNTER — Ambulatory Visit: Payer: Medicare Other

## 2022-11-05 ENCOUNTER — Telehealth: Payer: Self-pay

## 2022-11-05 ENCOUNTER — Other Ambulatory Visit: Payer: Self-pay | Admitting: Cardiology

## 2022-11-05 MED ORDER — FUROSEMIDE 20 MG PO TABS: 20.0000 mg | ORAL_TABLET | Freq: Every day | ORAL | 3 refills | Status: DC

## 2022-11-11 ENCOUNTER — Ambulatory Visit: Payer: Medicare Other

## 2022-11-13 ENCOUNTER — Encounter: Payer: Self-pay | Admitting: *Deleted

## 2022-11-13 ENCOUNTER — Ambulatory Visit: Payer: Medicare Other

## 2022-11-13 DIAGNOSIS — I214 Non-ST elevation (NSTEMI) myocardial infarction: Secondary | ICD-10-CM

## 2022-11-13 NOTE — Progress Notes (Signed)
Cardiac Individual Treatment Plan  Patient Details  Name: Evelyn Simon MRN: [id number] Date of Birth: 03-05-1955 Referring Provider:   Flowsheet Row Cardiac Rehab from 10/28/2022 in [facility] Cardiac and Pulmonary Rehab  Referring Provider Dr. Adrian Blackwater, MD       Initial Encounter Date:  Flowsheet Row Cardiac Rehab from 10/28/2022 in [facility] Cardiac and Pulmonary Rehab  Date 10/28/22       Visit Diagnosis: NSTEMI (non-ST elevated myocardial infarction) [facility])  Patient's Home Medications on Admission:  Current Outpatient Medications:    albuterol (VENTOLIN HFA) 108 (90 Base) MCG/ACT inhaler, Inhale 2 puffs into the lungs every 6 (six) hours as needed for shortness of breath or wheezing., Disp: , Rfl:    aspirin EC 81 MG tablet, Take 1 tablet (81 mg total) by mouth daily., Disp: 30 tablet, Rfl: 3   aspirin EC 81 MG tablet, Take 1 tablet by mouth daily. (Patient not taking: Reported on 10/23/2022), Disp: , Rfl:    atorvastatin (LIPITOR) 80 MG tablet, Take 1 tablet by mouth daily., Disp: , Rfl:    azelastine (ASTELIN) 0.1 % nasal spray, Place into both nostrils., Disp: , Rfl:    azelastine (OPTIVAR) 0.05 % ophthalmic solution, Apply to eye., Disp: , Rfl:    Bempedoic Acid-Ezetimibe 180-10 MG TABS, Take 1 tablet by mouth daily., Disp: 30 tablet, Rfl: 3   BIOTIN PO, Take 1 Dose by mouth daily. Liquid (Patient not taking: Reported on 10/23/2022), Disp: , Rfl:    bismuth subsalicylate (PEPTO BISMOL) 262 MG chewable tablet, Chew 524 mg by mouth daily as needed for indigestion., Disp: , Rfl:    BRILINTA 90 MG TABS tablet, Take 1 tablet (90 mg total) by mouth 2 (two) times daily., Disp: 60 tablet, Rfl: 1   celecoxib (CELEBREX) 200 MG capsule, Take 200 mg by mouth daily. (Patient not taking: Reported on 10/23/2022), Disp: , Rfl:    cetirizine (ZYRTEC) 10 MG tablet, Take 1 tablet by mouth daily., Disp: , Rfl:    diphenhydrAMINE (BENADRYL) 12.5 MG/5ML liquid, Take by mouth 4 (four) times daily as  needed., Disp: , Rfl:    diphenhydrAMINE (BENADRYL) 12.5 MG/5ML liquid, Take by mouth. (Patient not taking: Reported on 10/23/2022), Disp: , Rfl:    fexofenadine (ALLEGRA) 180 MG tablet, Take 180 mg by mouth daily. (Patient not taking: Reported on 10/23/2022), Disp: , Rfl:    fluticasone (FLONASE) 50 MCG/ACT nasal spray, Place into both nostrils., Disp: , Rfl:    furosemide (LASIX) 20 MG tablet, Take 1 tablet (20 mg total) by mouth daily., Disp: 30 tablet, Rfl: 3   gabapentin (NEURONTIN) 300 MG capsule, Take 300 mg by mouth daily as needed for pain., Disp: , Rfl:    gabapentin (NEURONTIN) 300 MG capsule, Take by mouth., Disp: , Rfl:    levothyroxine (SYNTHROID) 50 MCG tablet, Take 50 mcg by mouth daily., Disp: , Rfl:    lisinopril (ZESTRIL) 2.5 MG tablet, Take 1 tablet (2.5 mg total) by mouth daily., Disp: 30 tablet, Rfl: 11   lisinopril (ZESTRIL) 20 MG tablet, Take by mouth. (Patient not taking: Reported on 10/23/2022), Disp: , Rfl:    methocarbamol (ROBAXIN) 500 MG tablet, Take 500 mg by mouth daily as needed., Disp: , Rfl:    metoprolol succinate (TOPROL-XL) 50 MG 24 hr tablet, Take 1 tablet by mouth daily. (Patient not taking: Reported on 10/23/2022), Disp: , Rfl:    metoprolol tartrate (LOPRESSOR) 50 MG tablet, Take 50 mg by mouth 2 (two) times daily., Disp: , Rfl:  montelukast (SINGULAIR) 10 MG tablet, Take 10 mg by mouth daily., Disp: , Rfl:    NON FORMULARY, Take by mouth. (Patient not taking: Reported on 10/23/2022), Disp: , Rfl:    Polyethyl Glycol-Propyl Glycol (SYSTANE) 0.4-0.3 % SOLN, Place 1 drop into both eyes daily as needed (dry eyes)., Disp: , Rfl:    Probiotic Product (PROBIOTIC PO), Take 1 capsule by mouth daily. (Patient not taking: Reported on 10/23/2022), Disp: , Rfl:    ticagrelor (BRILINTA) 90 MG TABS tablet, Take by mouth., Disp: , Rfl:    traMADol (ULTRAM) 50 MG tablet, Take 50 mg by mouth 4 (four) times daily as needed., Disp: , Rfl:   Past Medical History: Past Medical  History:  Diagnosis Date   Arthritis    Asthma    Diverticulosis of colon    GERD (gastroesophageal reflux disease)    Headache    H/O MIGRAINES   Hypertension    Hypothyroidism     Tobacco Use: Social History   Tobacco Use  Smoking Status Former   Current packs/day: 0.00   Types: Cigarettes   Quit date: 2003   Years since quitting: 21.8  Smokeless Tobacco Never    Labs: Review Flowsheet        No data to display           Exercise Target Goals: Exercise Program Goal: Individual exercise prescription set using results from initial 6 min walk test and THRR while considering  patient's activity barriers and safety.   Exercise Prescription Goal: Initial exercise prescription builds to 30-45 minutes a day of aerobic activity, 2-3 days per week.  Home exercise guidelines will be given to patient during program as part of exercise prescription that the participant will acknowledge.   Education: Aerobic Exercise: - Group verbal and visual presentation on the components of exercise prescription. Introduces F.I.T.T principle from ACSM for exercise prescriptions.  Reviews F.I.T.T. principles of aerobic exercise including progression. Written material given at graduation. Flowsheet Row Cardiac Rehab from 10/28/2022 in [facility] Cardiac and Pulmonary Rehab  Education need identified 10/28/22       Education: Resistance Exercise: - Group verbal and visual presentation on the components of exercise prescription. Introduces F.I.T.T principle from ACSM for exercise prescriptions  Reviews F.I.T.T. principles of resistance exercise including progression. Written material given at graduation.    Education: Exercise & Equipment Safety: - Individual verbal instruction and demonstration of equipment use and safety with use of the equipment. Flowsheet Row Cardiac Rehab from 10/28/2022 in [facility] Cardiac and Pulmonary Rehab  Date 10/28/22  Educator NT  Instruction Review Code 1-  Verbalizes Understanding       Education: Exercise Physiology & General Exercise Guidelines: - Group verbal and written instruction with models to review the exercise physiology of the cardiovascular system and associated critical values. Provides general exercise guidelines with specific guidelines to those with heart or lung disease.    Education: Flexibility, Balance, Mind/Body Relaxation: - Group verbal and visual presentation with interactive activity on the components of exercise prescription. Introduces F.I.T.T principle from ACSM for exercise prescriptions. Reviews F.I.T.T. principles of flexibility and balance exercise training including progression. Also discusses the mind body connection.  Reviews various relaxation techniques to help reduce and manage stress (i.e. Deep breathing, progressive muscle relaxation, and visualization). Balance handout provided to take home. Written material given at graduation.   Activity Barriers & Risk Stratification:  Activity Barriers & Cardiac Risk Stratification - 10/28/22 1318       Activity Barriers &  Cardiac Risk Stratification   Activity Barriers Joint Problems;Other (comment)    Comments Fibromyalgia, foot pain, groin pain, trigger thumb    Cardiac Risk Stratification High             6 Minute Walk:  6 Minute Walk     Row Name 10/28/22 1317         6 Minute Walk   Phase Initial     Distance 1125 feet     Walk Time 6 minutes     # of Rest Breaks 0     MPH 2.13     METS 2.54     RPE 11     Perceived Dyspnea  1     VO2 Peak 8.9     Symptoms Yes (comment)     Comments L knee pain 5/10     Resting HR 66 bpm     Resting BP 136/70     Resting Oxygen Saturation  97 %     Exercise Oxygen Saturation  during 6 min walk 95 %     Max Ex. HR 86 bpm     Max Ex. BP 144/72     2 Minute Post BP 140/68              Oxygen Initial Assessment:   Oxygen Re-Evaluation:   Oxygen Discharge (Final Oxygen  Re-Evaluation):   Initial Exercise Prescription:  Initial Exercise Prescription - 10/28/22 1300       Date of Initial Exercise RX and Referring Provider   Date 10/28/22    Referring Provider Dr. Adrian Blackwater, MD      Oxygen   Maintain Oxygen Saturation 88% or higher      Treadmill   MPH 2    Grade 0    Minutes 15    METs 2.53      NuStep   Level 2    SPM 80    Minutes 15    METs 2.54      Biostep-RELP   Level 2    SPM 50    Minutes 15    METs 2.54      Prescription Details   Frequency (times per week) 3    Duration Progress to 30 minutes of continuous aerobic without signs/symptoms of physical distress      Intensity   THRR 40-80% of Max Heartrate 101-136    Ratings of Perceived Exertion 11-13    Perceived Dyspnea 0-4      Progression   Progression Continue to progress workloads to maintain intensity without signs/symptoms of physical distress.      Resistance Training   Training Prescription Yes    Weight 3 lb    Reps 10-15             Perform Capillary Blood Glucose checks as needed.  Exercise Prescription Changes:   Exercise Prescription Changes     Row Name 10/28/22 1300             Response to Exercise   Blood Pressure (Admit) 136/70       Blood Pressure (Exercise) 144/72       Blood Pressure (Exit) 140/68       Heart Rate (Admit) 66 bpm       Heart Rate (Exercise) 86 bpm       Heart Rate (Exit) 68 bpm       Oxygen Saturation (Admit) 97 %       Oxygen Saturation (Exercise) 95 %  Rating of Perceived Exertion (Exercise) 11       Perceived Dyspnea (Exercise) 1       Symptoms L knee pain 5/10       Comments Results                Exercise Comments:   Exercise Goals and Review:   Exercise Goals     Row Name 10/28/22 1319             Exercise Goals   Increase Physical Activity Yes       Intervention Provide advice, education, support and counseling about physical activity/exercise needs.;Develop an  individualized exercise prescription for aerobic and resistive training based on initial evaluation findings, risk stratification, comorbidities and participant's personal goals.       Expected Outcomes Long Term: Add in home exercise to make exercise part of routine and to increase amount of physical activity.;Short Term: Attend rehab on a regular basis to increase amount of physical activity.;Long Term: Exercising regularly at least 3-5 days a week.       Increase Strength and Stamina Yes       Intervention Provide advice, education, support and counseling about physical activity/exercise needs.;Develop an individualized exercise prescription for aerobic and resistive training based on initial evaluation findings, risk stratification, comorbidities and participant's personal goals.       Expected Outcomes Short Term: Increase workloads from initial exercise prescription for resistance, speed, and METs.;Short Term: Perform resistance training exercises routinely during rehab and add in resistance training at home;Long Term: Improve cardiorespiratory fitness, muscular endurance and strength as measured by increased METs and functional capacity ( )       Able to understand and use rate of perceived exertion (RPE) scale Yes       Intervention Provide education and explanation on how to use RPE scale       Expected Outcomes Short Term: Able to use RPE daily in rehab to express subjective intensity level;Long Term:  Able to use RPE to guide intensity level when exercising independently       Able to understand and use Dyspnea scale Yes       Intervention Provide education and explanation on how to use Dyspnea scale       Expected Outcomes Short Term: Able to use Dyspnea scale daily in rehab to express subjective sense of shortness of breath during exertion;Long Term: Able to use Dyspnea scale to guide intensity level when exercising independently       Knowledge and understanding of Target Heart Rate Range  (THRR) Yes       Intervention Provide education and explanation of THRR including how the numbers were predicted and where they are located for reference       Expected Outcomes Short Term: Able to state/look up THRR;Long Term: Able to use THRR to govern intensity when exercising independently;Short Term: Able to use daily as guideline for intensity in rehab       Able to check pulse independently Yes       Intervention Provide education and demonstration on how to check pulse in carotid and radial arteries.;Review the importance of being able to check your own pulse for safety during independent exercise       Expected Outcomes Short Term: Able to explain why pulse checking is important during independent exercise;Long Term: Able to check pulse independently and accurately       Understanding of Exercise Prescription Yes       Intervention Provide education,  explanation, and written materials on patient's individual exercise prescription       Expected Outcomes Short Term: Able to explain program exercise prescription;Long Term: Able to explain home exercise prescription to exercise independently                Exercise Goals Re-Evaluation :   Discharge Exercise Prescription (Final Exercise Prescription Changes):  Exercise Prescription Changes - 10/28/22 1300       Response to Exercise   Blood Pressure (Admit) 136/70    Blood Pressure (Exercise) 144/72    Blood Pressure (Exit) 140/68    Heart Rate (Admit) 66 bpm    Heart Rate (Exercise) 86 bpm    Heart Rate (Exit) 68 bpm    Oxygen Saturation (Admit) 97 %    Oxygen Saturation (Exercise) 95 %    Rating of Perceived Exertion (Exercise) 11    Perceived Dyspnea (Exercise) 1    Symptoms L knee pain 5/10    Comments Results             Nutrition:  Target Goals: Understanding of nutrition guidelines, daily intake of sodium 1500mg , cholesterol 200mg , calories 30% from fat and 7% or less from saturated fats, daily to have 5  or more servings of fruits and vegetables.  Education: All About Nutrition: -Group instruction provided by verbal, written material, interactive activities, discussions, models, and posters to present general guidelines for heart healthy nutrition including fat, fiber, MyPlate, the role of sodium in heart healthy nutrition, utilization of the nutrition label, and utilization of this knowledge for meal planning. Follow up email sent as well. Written material given at graduation.   Biometrics:  Pre Biometrics - 10/28/22 1319       Pre Biometrics   Height 5\' 3"  (1.6 m)    Weight 170 lb 4.8 oz (77.2 kg)    Waist Circumference 36 inches    Hip Circumference 41 inches    Waist to Hip Ratio 0.88 %    BMI (Calculated) 30.18    Single Leg Stand 8.05 seconds              Nutrition Therapy Plan and Nutrition Goals:  Nutrition Therapy & Goals - 10/28/22 1013       Nutrition Therapy   RD appointment deferred Yes      Intervention Plan   Intervention Prescribe, educate and counsel regarding individualized specific dietary modifications aiming towards targeted core components such as weight, hypertension, lipid management, diabetes, heart failure and other comorbidities.    Expected Outcomes Short Term Goal: Understand basic principles of dietary content, such as calories, fat, sodium, cholesterol and nutrients.;Short Term Goal: A plan has been developed with personal nutrition goals set during dietitian appointment.;Long Term Goal: Adherence to prescribed nutrition plan.             Nutrition Assessments:  MEDIFICTS Score Key: >=70 Need to make dietary changes  40-70 Heart Healthy Diet <= 40 Therapeutic Level Cholesterol Diet  Flowsheet Row Cardiac Rehab from 10/28/2022 in [facility] Cardiac and Pulmonary Rehab  Picture Your Plate Total Score on Admission 51      Picture Your Plate Scores: <93 Unhealthy dietary pattern with much room for improvement. 41-50 Dietary pattern  unlikely to meet recommendations for good health and room for improvement. 51-60 More healthful dietary pattern, with some room for improvement.  >60 Healthy dietary pattern, although there may be some specific behaviors that could be improved.    Nutrition Goals Re-Evaluation:   Nutrition Goals Discharge (  Final Nutrition Goals Re-Evaluation):   Psychosocial: Target Goals: Acknowledge presence or absence of significant depression and/or stress, maximize coping skills, provide positive support system. Participant is able to verbalize types and ability to use techniques and skills needed for reducing stress and depression.   Education: Stress, Anxiety, and Depression - Group verbal and visual presentation to define topics covered.  Reviews how body is impacted by stress, anxiety, and depression.  Also discusses healthy ways to reduce stress and to treat/manage anxiety and depression.  Written material given at graduation.   Education: Sleep Hygiene -Provides group verbal and written instruction about how sleep can affect your health.  Define sleep hygiene, discuss sleep cycles and impact of sleep habits. Review good sleep hygiene tips.    Initial Review & Psychosocial Screening:  Initial Psych Review & Screening - 10/23/22 1345       Initial Review   Current issues with Current Sleep Concerns      Family Dynamics   Good Support System? Yes    Comments She has a hard time sleeping alot. Her friend Olegario Messier is a good source for support.      Barriers   Psychosocial barriers to participate in program The patient should benefit from training in stress management and relaxation.      Screening Interventions   Interventions Encouraged to exercise;Provide feedback about the scores to participant;To provide support and resources with identified psychosocial needs    Expected Outcomes Short Term goal: Utilizing psychosocial counselor, staff and physician to assist with identification of  specific Stressors or current issues interfering with healing process. Setting desired goal for each stressor or current issue identified.;Long Term Goal: Stressors or current issues are controlled or eliminated.;Short Term goal: Identification and review with participant of any Quality of Life or Depression concerns found by scoring the questionnaire.;Long Term goal: The participant improves quality of Life and PHQ9 Scores as seen by post scores and/or verbalization of changes             Quality of Life Scores:   Quality of Life - 10/28/22 1304       Quality of Life   Select Quality of Life      Quality of Life Scores   Health/Function Pre 19.7 %    Socioeconomic Pre 23.69 %    Psych/Spiritual Pre 22.07 %    Family Pre 27 %    GLOBAL Pre 21.99 %            Scores of 19 and below usually indicate a poorer quality of life in these areas.  A difference of  2-3 points is a clinically meaningful difference.  A difference of 2-3 points in the total score of the Quality of Life Index has been associated with significant improvement in overall quality of life, self-image, physical symptoms, and general health in studies assessing change in quality of life.  PHQ-9: Review Flowsheet       10/28/2022  Depression screen PHQ 2/9  Decreased Interest 0  Down, Depressed, Hopeless 0  PHQ - 2 Score 0  Altered sleeping 1  Tired, decreased energy 1  Change in appetite 0  Feeling bad or failure about yourself  0  Trouble concentrating 1  Moving slowly or fidgety/restless 0  Suicidal thoughts 0  PHQ-9 Score 3  Difficult doing work/chores Somewhat difficult    Details           Interpretation of Total Score  Total Score Depression Severity:  1-4 = Minimal  depression, 5-9 = Mild depression, 10-14 = Moderate depression, 15-19 = Moderately severe depression, 20-27 = Severe depression   Psychosocial Evaluation and Intervention:  Psychosocial Evaluation - 10/23/22 1346        Psychosocial Evaluation & Interventions   Interventions Relaxation education;Stress management education;Encouraged to exercise with the program and follow exercise prescription    Comments She has a hard time sleeping alot. Her friend Olegario Messier is a good source for support.    Expected Outcomes Short: Start Heart[facility] to help with mood. Long: Maintain a healthy mental state    Continue Psychosocial Services  Follow up required by staff             Psychosocial Re-Evaluation:   Psychosocial Discharge (Final Psychosocial Re-Evaluation):   Vocational Rehabilitation: Provide vocational rehab assistance to qualifying candidates.   Vocational Rehab Evaluation & Intervention:   Education: Education Goals: Education classes will be provided on a variety of topics geared toward better understanding of heart health and risk factor modification. Participant will state understanding/return demonstration of topics presented as noted by education test scores.  Learning Barriers/Preferences:  Learning Barriers/Preferences - 10/23/22 1344       Learning Barriers/Preferences   Learning Barriers None    Learning Preferences None             General Cardiac Education Topics:  AED/CPR: - Group verbal and written instruction with the use of models to demonstrate the basic use of the AED with the basic ABC's of resuscitation.   Anatomy and Cardiac Procedures: - Group verbal and visual presentation and models provide information about basic cardiac anatomy and function. Reviews the testing methods done to diagnose heart disease and the outcomes of the test results. Describes the treatment choices: Medical Management, Angioplasty, or Coronary Bypass Surgery for treating various heart conditions including Myocardial Infarction, Angina, Valve Disease, and Cardiac Arrhythmias.  Written material given at graduation.   Medication Safety: - Group verbal and visual instruction to review commonly  prescribed medications for heart and lung disease. Reviews the medication, class of the drug, and side effects. Includes the steps to properly store meds and maintain the prescription regimen.  Written material given at graduation.   Intimacy: - Group verbal instruction through game format to discuss how heart and lung disease can affect sexual intimacy. Written material given at graduation..   Know Your Numbers and Heart Failure: - Group verbal and visual instruction to discuss disease risk factors for cardiac and pulmonary disease and treatment options.  Reviews associated critical values for Overweight/Obesity, Hypertension, Cholesterol, and Diabetes.  Discusses basics of heart failure: signs/symptoms and treatments.  Introduces Heart Failure Zone chart for action plan for heart failure.  Written material given at graduation.   Infection Prevention: - Provides verbal and written material to individual with discussion of infection control including proper hand washing and proper equipment cleaning during exercise session. Flowsheet Row Cardiac Rehab from 10/28/2022 in [facility] Cardiac and Pulmonary Rehab  Date 10/28/22  Educator NT  Instruction Review Code 1- Verbalizes Understanding       Falls Prevention: - Provides verbal and written material to individual with discussion of falls prevention and safety. Flowsheet Row Cardiac Rehab from 10/28/2022 in [facility] Cardiac and Pulmonary Rehab  Date 10/28/22  Educator NT  Instruction Review Code 1- Verbalizes Understanding       Other: -Provides group and verbal instruction on various topics (see comments)   Knowledge Questionnaire Score:  Knowledge Questionnaire Score - 10/28/22 1306  Knowledge Questionnaire Score   Pre Score 25/26             Core Components/Risk Factors/Patient Goals at Admission:  Personal Goals and Risk Factors at Admission - 10/23/22 1344       Core Components/Risk Factors/Patient Goals on  Admission    Weight Management Weight Loss;Yes    Intervention Weight Management: Develop a combined nutrition and exercise program designed to reach desired caloric intake, while maintaining appropriate intake of nutrient and fiber, sodium and fats, and appropriate energy expenditure required for the weight goal.;Weight Management: Provide education and appropriate resources to help participant work on and attain dietary goals.;Weight Management/Obesity: Establish reasonable short term and long term weight goals.;Obesity: Provide education and appropriate resources to help participant work on and attain dietary goals.    Expected Outcomes Short Term: Continue to assess and modify interventions until short term weight is achieved;Long Term: Adherence to nutrition and physical activity/exercise program aimed toward attainment of established weight goal;Weight Loss: Understanding of general recommendations for a balanced deficit meal plan, which promotes 1-2 lb weight loss per week and includes a negative energy balance of [phone] kcal/d;Understanding recommendations for meals to include 15-35% energy as protein, 25-35% energy from fat, 35-60% energy from carbohydrates, less than 200mg  of dietary cholesterol, 20-35 gm of total fiber daily;Understanding of distribution of calorie intake throughout the day with the consumption of 4-5 meals/snacks    Hypertension Yes    Intervention Provide education on lifestyle modifcations including regular physical activity/exercise, weight management, moderate sodium restriction and increased consumption of fresh fruit, vegetables, and low fat dairy, alcohol moderation, and smoking cessation.;Monitor prescription use compliance.    Expected Outcomes Short Term: Continued assessment and intervention until BP is < 140/30mm HG in hypertensive participants. < 130/63mm HG in hypertensive participants with diabetes, heart failure or chronic kidney disease.;Long Term: Maintenance of  blood pressure at goal levels.    Lipids Yes    Intervention Provide education and support for participant on nutrition & aerobic/resistive exercise along with prescribed medications to achieve LDL 70mg , HDL >40mg .    Expected Outcomes Short Term: Participant states understanding of desired cholesterol values and is compliant with medications prescribed. Participant is following exercise prescription and nutrition guidelines.;Long Term: Cholesterol controlled with medications as prescribed, with individualized exercise RX and with personalized nutrition plan. Value goals: LDL < 70mg , HDL > 40 mg.             Education:Diabetes - Individual verbal and written instruction to review signs/symptoms of diabetes, desired ranges of glucose level fasting, after meals and with exercise. Acknowledge that pre and post exercise glucose checks will be done for 3 sessions at entry of program.   Core Components/Risk Factors/Patient Goals Review:    Core Components/Risk Factors/Patient Goals at Discharge (Final Review):    ITP Comments:  ITP Comments     Row Name 10/23/22 1353 10/28/22 1013 11/13/22 0842       ITP Comments Virtual Visit completed. Patient informed on EP and RD appointment and 6 Minute walk test. Patient also informed of patient health questionnaires on My Chart. Patient Verbalizes understanding. Visit diagnosis can be found in [facility] 09/09/[facility]. Completed and gym orientation. Initial ITP created and sent for review to Dr. Bethann Punches, Medical Director. 30 Day review completed. Medical Director ITP review done, changes made as directed, and signed approval by Medical Director.    new to program              Comments:

## 2022-11-14 ENCOUNTER — Ambulatory Visit: Payer: Medicare Other | Admitting: *Deleted

## 2022-11-18 ENCOUNTER — Ambulatory Visit: Payer: Medicare Other

## 2022-11-20 ENCOUNTER — Encounter: Payer: Self-pay | Admitting: Podiatry

## 2022-11-20 ENCOUNTER — Ambulatory Visit: Payer: Medicare Other

## 2022-11-20 ENCOUNTER — Ambulatory Visit (INDEPENDENT_AMBULATORY_CARE_PROVIDER_SITE_OTHER): Payer: Medicare Other | Admitting: Podiatry

## 2022-11-20 VITALS — Ht 63.0 in | Wt 170.3 lb

## 2022-11-20 DIAGNOSIS — G5761 Lesion of plantar nerve, right lower limb: Secondary | ICD-10-CM | POA: Diagnosis not present

## 2022-11-20 DIAGNOSIS — M79671 Pain in right foot: Secondary | ICD-10-CM

## 2022-11-20 NOTE — Patient Instructions (Addendum)
Call [facility] to schedule your MRI at the below locations.  Please allow at least 1 business day after your visit to process the referral.  It may take longer depending on approval from insurance.  Please let me know if you have issues or problems scheduling the MRI   [facility] [phone] [address] [place], [place] [address]  [facility] [phone] [address] [place], [place] [address]   VISIT SUMMARY:  During today's visit, we discussed your persistent foot pain and its possible causes, as well as your ongoing fibromyalgia management. We reviewed your history of neuromas and the pain you are currently experiencing in your foot, particularly with certain types of shoes. We also discussed the next steps to diagnose and manage your foot pain.  YOUR PLAN:  -FOOT PAIN (POSSIBLE STUMP NEUROMA): Your foot pain may be due to a stump neuroma, which is a painful nerve ending that can form after a neuroma is removed, or it could be related to joint issues. We will order an MRI to get a clearer picture of what is causing your pain. In the meantime, using a metatarsal pad or sleeve can help reduce pressure and alleviate pain. Depending on the MRI results, we may consider a steroid injection to help manage your pain, especially with your upcoming cruise in mind.  .  INSTRUCTIONS:  Please schedule an MRI as soon as possible. We have set a follow-up appointment for one week after your MRI to discuss the results and plan the next steps. If you experience any new or worsening symptoms, please contact our office.  Metatarsal felt pads or a metatarsal pad sleeve can be bought on [employer], use this for now (should be able to find latex free versions)

## 2022-11-20 NOTE — Progress Notes (Signed)
  Subjective:  Patient ID: Evelyn Simon, female    DOB: [birth year],  MRN: [id number]  Chief Complaint  Patient presents with   Foot Pain    Pt is here due to foot pain in bilateral feet mainly in right foot, states she had neuroma in boot feet also, pain has been ongoing for 6 months with no relief    Discussed the use of AI scribe software for clinical note transcription with the patient, who gave verbal consent to proceed.  History of Present Illness   The patient, with a history of bilateral foot neuromas, presents with persistent foot pain. She had two neuromas removed from one foot approximately ten years ago. The other foot also had neuromas, but they were not severe enough to warrant removal and have remained stable. However, the patient reports that one foot causes significant pain, particularly depending on the type of shoes she wears. The pain is located between the toes and radiates. The patient has not had any injections since the onset of this pain and went straight to surgery for the previous neuromas. The patient also reports joint pain in the foot. She has not had any imaging other than an x-ray. The patient also has fibromyalgia, which can cause generalized pain.          Objective:    Physical Exam   EXTREMITIES: +2 DP and PT pulse, pain in plantar 2nd and 3rd interspace with radiation into toes, pain on top of the scars here dorsally as well. Slight pain to joint manipulation and palpation of 2nd/3rd/4th MPTJ       No images are attached to the encounter.    Right foot xray taken today show no fracture, joint derangement or osseous abnormalities          Assessment:   1. Morton neuroma of right foot      Plan:  Patient was evaluated and treated and all questions answered.  Assessment and Plan    Foot Pain (Possible Stump Neuroma)   An examination of her foot, where she previously had a neuroma removed, indicates pain likely due to a stump neuroma or joint  issues, exacerbated by certain footwear and compression. We will order an MRI to evaluate for stump neuroma or joint capsulitis or arthritis of the lesser MTPs. A metatarsal pad or sleeve will be considered to alleviate pressure and pain. Depending on the MRI results, a steroid injection may be planned to manage pain during her upcoming cruise. A follow-up appointment is scheduled one week after the MRI.          Return for after MRI to review.

## 2022-11-21 ENCOUNTER — Ambulatory Visit: Payer: Medicare Other | Admitting: *Deleted

## 2022-11-25 ENCOUNTER — Ambulatory Visit: Payer: Medicare Other

## 2022-11-27 ENCOUNTER — Ambulatory Visit: Payer: Medicare Other

## 2022-11-28 ENCOUNTER — Ambulatory Visit: Payer: Medicare Other | Admitting: *Deleted

## 2022-12-02 ENCOUNTER — Telehealth: Payer: Self-pay | Admitting: *Deleted

## 2022-12-02 ENCOUNTER — Ambulatory Visit: Payer: Medicare Other

## 2022-12-02 ENCOUNTER — Encounter: Payer: Self-pay | Admitting: *Deleted

## 2022-12-02 NOTE — Telephone Encounter (Signed)
Called to check on pt. She has not been to rehab since her orientation on 10/14. Per pt's chart, she had heart cath at [facility] 11/14. Attempted to call pt to follow up. No answer at this time. Will try to reach pt at a later time.

## 2022-12-03 NOTE — Telephone Encounter (Signed)
Attempted to call pt. No answer. Lmtcb x 2.  

## 2022-12-04 ENCOUNTER — Encounter: Payer: Self-pay | Admitting: *Deleted

## 2022-12-04 ENCOUNTER — Ambulatory Visit: Payer: Medicare Other

## 2022-12-04 DIAGNOSIS — I214 Non-ST elevation (NSTEMI) myocardial infarction: Secondary | ICD-10-CM

## 2022-12-04 NOTE — Progress Notes (Signed)
Cardiac Individual Treatment Plan  Patient Details  Name: Evelyn Simon MRN: [id number] Date of Birth: 02/22/1955 Referring Provider:   Flowsheet Row Cardiac Rehab from 10/28/2022 in [facility] Cardiac and Pulmonary Rehab  Referring Provider Dr. Adrian Blackwater, MD       Initial Encounter Date:  Flowsheet Row Cardiac Rehab from 10/28/2022 in [facility] Cardiac and Pulmonary Rehab  Date 10/28/22       Visit Diagnosis: NSTEMI (non-ST elevated myocardial infarction) [facility])  Patient's Home Medications on Admission:  Current Outpatient Medications:    albuterol (VENTOLIN HFA) 108 (90 Base) MCG/ACT inhaler, Inhale 2 puffs into the lungs every 6 (six) hours as needed for shortness of breath or wheezing., Disp: , Rfl:    aspirin EC 81 MG tablet, Take 1 tablet (81 mg total) by mouth daily., Disp: 30 tablet, Rfl: 3   aspirin EC 81 MG tablet, Take 1 tablet by mouth daily., Disp: , Rfl:    atorvastatin (LIPITOR) 80 MG tablet, Take 1 tablet by mouth daily., Disp: , Rfl:    azelastine (ASTELIN) 0.1 % nasal spray, Place into both nostrils., Disp: , Rfl:    azelastine (OPTIVAR) 0.05 % ophthalmic solution, Apply to eye., Disp: , Rfl:    Bempedoic Acid-Ezetimibe 180-10 MG TABS, Take 1 tablet by mouth daily., Disp: 30 tablet, Rfl: 3   BIOTIN PO, Take 1 Dose by mouth daily. Liquid, Disp: , Rfl:    bismuth subsalicylate (PEPTO BISMOL) 262 MG chewable tablet, Chew 524 mg by mouth daily as needed for indigestion., Disp: , Rfl:    BRILINTA 90 MG TABS tablet, Take 1 tablet (90 mg total) by mouth 2 (two) times daily., Disp: 60 tablet, Rfl: 1   celecoxib (CELEBREX) 200 MG capsule, Take 200 mg by mouth daily., Disp: , Rfl:    cetirizine (ZYRTEC) 10 MG tablet, Take 1 tablet by mouth daily., Disp: , Rfl:    diphenhydrAMINE (BENADRYL) 12.5 MG/5ML liquid, Take by mouth 4 (four) times daily as needed., Disp: , Rfl:    diphenhydrAMINE (BENADRYL) 12.5 MG/5ML liquid, Take by mouth., Disp: , Rfl:    fexofenadine (ALLEGRA) 180 MG  tablet, Take 180 mg by mouth daily., Disp: , Rfl:    fluticasone (FLONASE) 50 MCG/ACT nasal spray, Place into both nostrils., Disp: , Rfl:    furosemide (LASIX) 20 MG tablet, Take 1 tablet (20 mg total) by mouth daily., Disp: 30 tablet, Rfl: 3   gabapentin (NEURONTIN) 300 MG capsule, Take 300 mg by mouth daily as needed for pain., Disp: , Rfl:    gabapentin (NEURONTIN) 300 MG capsule, Take by mouth., Disp: , Rfl:    levothyroxine (SYNTHROID) 50 MCG tablet, Take 50 mcg by mouth daily., Disp: , Rfl:    lisinopril (ZESTRIL) 2.5 MG tablet, Take 1 tablet (2.5 mg total) by mouth daily., Disp: 30 tablet, Rfl: 11   lisinopril (ZESTRIL) 20 MG tablet, Take by mouth., Disp: , Rfl:    methocarbamol (ROBAXIN) 500 MG tablet, Take 500 mg by mouth daily as needed., Disp: , Rfl:    metoprolol succinate (TOPROL-XL) 50 MG 24 hr tablet, Take 1 tablet by mouth daily., Disp: , Rfl:    metoprolol tartrate (LOPRESSOR) 50 MG tablet, Take 50 mg by mouth 2 (two) times daily., Disp: , Rfl:    montelukast (SINGULAIR) 10 MG tablet, Take 10 mg by mouth daily., Disp: , Rfl:    NON FORMULARY, Take by mouth., Disp: , Rfl:    Polyethyl Glycol-Propyl Glycol (SYSTANE) 0.4-0.3 % SOLN, Place 1 drop into  both eyes daily as needed (dry eyes)., Disp: , Rfl:    Probiotic Product (PROBIOTIC PO), Take 1 capsule by mouth daily., Disp: , Rfl:    ticagrelor (BRILINTA) 90 MG TABS tablet, Take by mouth., Disp: , Rfl:    traMADol (ULTRAM) 50 MG tablet, Take 50 mg by mouth 4 (four) times daily as needed., Disp: , Rfl:   Past Medical History: Past Medical History:  Diagnosis Date   Arthritis    Asthma    Diverticulosis of colon    GERD (gastroesophageal reflux disease)    Headache    H/O MIGRAINES   Hypertension    Hypothyroidism     Tobacco Use: Social History   Tobacco Use  Smoking Status Former   Current packs/day: 0.00   Types: Cigarettes   Quit date: 2003   Years since quitting: 21.9  Smokeless Tobacco Never     Labs: Review Flowsheet        No data to display           Exercise Target Goals: Exercise Program Goal: Individual exercise prescription set using results from initial 6 min walk test and THRR while considering  patient's activity barriers and safety.   Exercise Prescription Goal: Initial exercise prescription builds to 30-45 minutes a day of aerobic activity, 2-3 days per week.  Home exercise guidelines will be given to patient during program as part of exercise prescription that the participant will acknowledge.   Education: Aerobic Exercise: - Group verbal and visual presentation on the components of exercise prescription. Introduces F.I.T.T principle from ACSM for exercise prescriptions.  Reviews F.I.T.T. principles of aerobic exercise including progression. Written material given at graduation. Flowsheet Row Cardiac Rehab from 10/28/2022 in [facility] Cardiac [facility] Rehab  Education need identified 10/28/22       Education: Resistance Exercise: - Group verbal and visual presentation on the components of exercise prescription. Introduces F.I.T.T principle from ACSM for exercise prescriptions  Reviews F.I.T.T. principles of resistance exercise including progression. Written material given at graduation.    Education: Exercise & Equipment Safety: - Individual verbal instruction and demonstration of equipment use and safety with use of the equipment. Flowsheet Row Cardiac Rehab from 10/28/2022 in [facility] Cardiac and Pulmonary Rehab  Date 10/28/22  Educator NT  Instruction Review Code 1- Verbalizes Understanding       Education: Exercise Physiology & General Exercise Guidelines: - Group verbal and written instruction with models to review the exercise physiology of the cardiovascular system and associated critical values. Provides general exercise guidelines with specific guidelines to those with heart or lung disease.    Education: Flexibility, Balance, Mind/Body  Relaxation: - Group verbal and visual presentation with interactive activity on the components of exercise prescription. Introduces F.I.T.T principle from ACSM for exercise prescriptions. Reviews F.I.T.T. principles of flexibility and balance exercise training including progression. Also discusses the mind body connection.  Reviews various relaxation techniques to help reduce and manage stress (i.e. Deep breathing, progressive muscle relaxation, and visualization). Balance handout provided to take home. Written material given at graduation.   Activity Barriers & Risk Stratification:  Activity Barriers & Cardiac Risk Stratification - 10/28/22 1318       Activity Barriers & Cardiac Risk Stratification   Activity Barriers Joint Problems;Other (comment)    Comments Fibromyalgia, foot pain, groin pain, trigger thumb    Cardiac Risk Stratification High             6 Minute Walk:  6 Minute Walk  Row Name 10/28/22 1317         6 Minute Walk   Phase Initial     Distance 1125 feet     Walk Time 6 minutes     # of Rest Breaks 0     MPH 2.13     METS 2.54     RPE 11     Perceived Dyspnea  1     VO2 Peak 8.9     Symptoms Yes (comment)     Comments L knee pain 5/10     Resting HR 66 bpm     Resting BP 136/70     Resting Oxygen Saturation  97 %     Exercise Oxygen Saturation  during 6 min walk 95 %     Max Ex. HR 86 bpm     Max Ex. BP 144/72     2 Minute Post BP 140/68              Oxygen Initial Assessment:   Oxygen Re-Evaluation:   Oxygen Discharge (Final Oxygen Re-Evaluation):   Initial Exercise Prescription:  Initial Exercise Prescription - 10/28/22 1300       Date of Initial Exercise RX and Referring Provider   Date 10/28/22    Referring Provider Dr. Adrian Blackwater, MD      Oxygen   Maintain Oxygen Saturation 88% or higher      Treadmill   MPH 2    Grade 0    Minutes 15    METs 2.53      NuStep   Level 2    SPM 80    Minutes 15    METs 2.54       Biostep-RELP   Level 2    SPM 50    Minutes 15    METs 2.54      Prescription Details   Frequency (times per week) 3    Duration Progress to 30 minutes of continuous aerobic without signs/symptoms of physical distress      Intensity   THRR 40-80% of Max Heartrate 101-136    Ratings of Perceived Exertion 11-13    Perceived Dyspnea 0-4      Progression   Progression Continue to progress workloads to maintain intensity without signs/symptoms of physical distress.      Resistance Training   Training Prescription Yes    Weight 3 lb    Reps 10-15             Perform Capillary Blood Glucose checks as needed.  Exercise Prescription Changes:   Exercise Prescription Changes     Row Name 10/28/22 1300             Response to Exercise   Blood Pressure (Admit) 136/70       Blood Pressure (Exercise) 144/72       Blood Pressure (Exit) 140/68       Heart Rate (Admit) 66 bpm       Heart Rate (Exercise) 86 bpm       Heart Rate (Exit) 68 bpm       Oxygen Saturation (Admit) 97 %       Oxygen Saturation (Exercise) 95 %       Rating of Perceived Exertion (Exercise) 11       Perceived Dyspnea (Exercise) 1       Symptoms L knee pain 5/10       Comments Results  Exercise Comments:   Exercise Goals and Review:   Exercise Goals     Row Name 10/28/22 1319             Exercise Goals   Increase Physical Activity Yes       Intervention Provide advice, education, support and counseling about physical activity/exercise needs.;Develop an individualized exercise prescription for aerobic and resistive training based on initial evaluation findings, risk stratification, comorbidities and participant's personal goals.       Expected Outcomes Long Term: Add in home exercise to make exercise part of routine and to increase amount of physical activity.;Short Term: Attend rehab on a regular basis to increase amount of physical activity.;Long Term: Exercising  regularly at least 3-5 days a week.       Increase Strength and Stamina Yes       Intervention Provide advice, education, support and counseling about physical activity/exercise needs.;Develop an individualized exercise prescription for aerobic and resistive training based on initial evaluation findings, risk stratification, comorbidities and participant's personal goals.       Expected Outcomes Short Term: Increase workloads from initial exercise prescription for resistance, speed, and METs.;Short Term: Perform resistance training exercises routinely during rehab and add in resistance training at home;Long Term: Improve cardiorespiratory fitness, muscular endurance and strength as measured by increased METs and functional capacity ( )       Able to understand and use rate of perceived exertion (RPE) scale Yes       Intervention Provide education and explanation on how to use RPE scale       Expected Outcomes Short Term: Able to use RPE daily in rehab to express subjective intensity level;Long Term:  Able to use RPE to guide intensity level when exercising independently       Able to understand and use Dyspnea scale Yes       Intervention Provide education and explanation on how to use Dyspnea scale       Expected Outcomes Short Term: Able to use Dyspnea scale daily in rehab to express subjective sense of shortness of breath during exertion;Long Term: Able to use Dyspnea scale to guide intensity level when exercising independently       Knowledge and understanding of Target Heart Rate Range (THRR) Yes       Intervention Provide education and explanation of THRR including how the numbers were predicted and where they are located for reference       Expected Outcomes Short Term: Able to state/look up THRR;Long Term: Able to use THRR to govern intensity when exercising independently;Short Term: Able to use daily as guideline for intensity in rehab       Able to check pulse independently Yes        Intervention Provide education and demonstration on how to check pulse in carotid and radial arteries.;Review the importance of being able to check your own pulse for safety during independent exercise       Expected Outcomes Short Term: Able to explain why pulse checking is important during independent exercise;Long Term: Able to check pulse independently and accurately       Understanding of Exercise Prescription Yes       Intervention Provide education, explanation, and written materials on patient's individual exercise prescription       Expected Outcomes Short Term: Able to explain program exercise prescription;Long Term: Able to explain home exercise prescription to exercise independently                Exercise  Goals Re-Evaluation :   Discharge Exercise Prescription (Final Exercise Prescription Changes):  Exercise Prescription Changes - 10/28/22 1300       Response to Exercise   Blood Pressure (Admit) 136/70    Blood Pressure (Exercise) 144/72    Blood Pressure (Exit) 140/68    Heart Rate (Admit) 66 bpm    Heart Rate (Exercise) 86 bpm    Heart Rate (Exit) 68 bpm    Oxygen Saturation (Admit) 97 %    Oxygen Saturation (Exercise) 95 %    Rating of Perceived Exertion (Exercise) 11    Perceived Dyspnea (Exercise) 1    Symptoms L knee pain 5/10    Comments Results             Nutrition:  Target Goals: Understanding of nutrition guidelines, daily intake of sodium 1500mg , cholesterol 200mg , calories 30% from fat and 7% or less from saturated fats, daily to have 5 or more servings of fruits and vegetables.  Education: All About Nutrition: -Group instruction provided by verbal, written material, interactive activities, discussions, models, and posters to present general guidelines for heart healthy nutrition including fat, fiber, MyPlate, the role of sodium in heart healthy nutrition, utilization of the nutrition label, and utilization of this knowledge for meal  planning. Follow up email sent as well. Written material given at graduation.   Biometrics:  Pre Biometrics - 10/28/22 1319       Pre Biometrics   Height 5\' 3"  (1.6 m)    Weight 170 lb 4.8 oz (77.2 kg)    Waist Circumference 36 inches    Hip Circumference 41 inches    Waist to Hip Ratio 0.88 %    BMI (Calculated) 30.18    Single Leg Stand 8.05 seconds              Nutrition Therapy Plan and Nutrition Goals:  Nutrition Therapy & Goals - 10/28/22 1013       Nutrition Therapy   RD appointment deferred Yes      Intervention Plan   Intervention Prescribe, educate and counsel regarding individualized specific dietary modifications aiming towards targeted core components such as weight, hypertension, lipid management, diabetes, heart failure and other comorbidities.    Expected Outcomes Short Term Goal: Understand basic principles of dietary content, such as calories, fat, sodium, cholesterol and nutrients.;Short Term Goal: A plan has been developed with personal nutrition goals set during dietitian appointment.;Long Term Goal: Adherence to prescribed nutrition plan.             Nutrition Assessments:  MEDIFICTS Score Key: >=70 Need to make dietary changes  40-70 Heart Healthy Diet <= 40 Therapeutic Level Cholesterol Diet  Flowsheet Row Cardiac Rehab from 10/28/2022 in [facility] Cardiac and Pulmonary Rehab  Picture Your Plate Total Score on Admission 51      Picture Your Plate Scores: <16 Unhealthy dietary pattern with much room for improvement. 41-50 Dietary pattern unlikely to meet recommendations for good health and room for improvement. 51-60 More healthful dietary pattern, with some room for improvement.  >60 Healthy dietary pattern, although there may be some specific behaviors that could be improved.    Nutrition Goals Re-Evaluation:   Nutrition Goals Discharge (Final Nutrition Goals Re-Evaluation):   Psychosocial: Target Goals: Acknowledge presence or  absence of significant depression and/or stress, maximize coping skills, provide positive support system. Participant is able to verbalize types and ability to use techniques and skills needed for reducing stress and depression.   Education: Stress, Anxiety, and Depression -  Group verbal and visual presentation to define topics covered.  Reviews how body is impacted by stress, anxiety, and depression.  Also discusses healthy ways to reduce stress and to treat/manage anxiety and depression.  Written material given at graduation.   Education: Sleep Hygiene -Provides group verbal and written instruction about how sleep can affect your health.  Define sleep hygiene, discuss sleep cycles and impact of sleep habits. Review good sleep hygiene tips.    Initial Review & Psychosocial Screening:  Initial Psych Review & Screening - 10/23/22 1345       Initial Review   Current issues with Current Sleep Concerns      Family Dynamics   Good Support System? Yes    Comments She has a hard time sleeping alot. Her friend Olegario Messier is a good source for support.      Barriers   Psychosocial barriers to participate in program The patient should benefit from training in stress management and relaxation.      Screening Interventions   Interventions Encouraged to exercise;Provide feedback about the scores to participant;To provide support and resources with identified psychosocial needs    Expected Outcomes Short Term goal: Utilizing psychosocial counselor, staff and physician to assist with identification of specific Stressors or current issues interfering with healing process. Setting desired goal for each stressor or current issue identified.;Long Term Goal: Stressors or current issues are controlled or eliminated.;Short Term goal: Identification and review with participant of any Quality of Life or Depression concerns found by scoring the questionnaire.;Long Term goal: The participant improves quality of Life and  PHQ9 Scores as seen by post scores and/or verbalization of changes             Quality of Life Scores:   Quality of Life - 10/28/22 1304       Quality of Life   Select Quality of Life      Quality of Life Scores   Health/Function Pre 19.7 %    Socioeconomic Pre 23.69 %    Psych/Spiritual Pre 22.07 %    Family Pre 27 %    GLOBAL Pre 21.99 %            Scores of 19 and below usually indicate a poorer quality of life in these areas.  A difference of  2-3 points is a clinically meaningful difference.  A difference of 2-3 points in the total score of the Quality of Life Index has been associated with significant improvement in overall quality of life, self-image, physical symptoms, and general health in studies assessing change in quality of life.  PHQ-9: Review Flowsheet       10/28/2022  Depression screen PHQ 2/9  Decreased Interest 0  Down, Depressed, Hopeless 0  PHQ - 2 Score 0  Altered sleeping 1  Tired, decreased energy 1  Change in appetite 0  Feeling bad or failure about yourself  0  Trouble concentrating 1  Moving slowly or fidgety/restless 0  Suicidal thoughts 0  PHQ-9 Score 3  Difficult doing work/chores Somewhat difficult    Details           Interpretation of Total Score  Total Score Depression Severity:  1-4 = Minimal depression, 5-9 = Mild depression, 10-14 = Moderate depression, 15-19 = Moderately severe depression, 20-27 = Severe depression   Psychosocial Evaluation and Intervention:  Psychosocial Evaluation - 10/23/22 1346       Psychosocial Evaluation & Interventions   Interventions Relaxation education;Stress management education;Encouraged to exercise with the program  and follow exercise prescription    Comments She has a hard time sleeping alot. Her friend Olegario Messier is a good source for support.    Expected Outcomes Short: Start [facility] to help with mood. Long: Maintain a healthy mental state    Continue Psychosocial Services  Follow  up required by staff             Psychosocial Re-Evaluation:   Psychosocial Discharge (Final Psychosocial Re-Evaluation):   Vocational Rehabilitation: Provide vocational rehab assistance to qualifying candidates.   Vocational Rehab Evaluation & Intervention:   Education: Education Goals: Education classes will be provided on a variety of topics geared toward better understanding of heart health and risk factor modification. Participant will state understanding/return demonstration of topics presented as noted by education test scores.  Learning Barriers/Preferences:  Learning Barriers/Preferences - 10/23/22 1344       Learning Barriers/Preferences   Learning Barriers None    Learning Preferences None             General Cardiac Education Topics:  AED/CPR: - Group verbal and written instruction with the use of models to demonstrate the basic use of the AED with the basic ABC's of resuscitation.   Anatomy and Cardiac Procedures: - Group verbal and visual presentation and models provide information about basic cardiac anatomy and function. Reviews the testing methods done to diagnose heart disease and the outcomes of the test results. Describes the treatment choices: Medical Management, Angioplasty, or Coronary Bypass Surgery for treating various heart conditions including Myocardial Infarction, Angina, Valve Disease, and Cardiac Arrhythmias.  Written material given at graduation.   Medication Safety: - Group verbal and visual instruction to review commonly prescribed medications for heart and lung disease. Reviews the medication, class of the drug, and side effects. Includes the steps to properly store meds and maintain the prescription regimen.  Written material given at graduation.   Intimacy: - Group verbal instruction through game format to discuss how heart and lung disease can affect sexual intimacy. Written material given at graduation..   Know Your Numbers  and Heart Failure: - Group verbal and visual instruction to discuss disease risk factors for cardiac and pulmonary disease and treatment options.  Reviews associated critical values for Overweight/Obesity, Hypertension, Cholesterol, and Diabetes.  Discusses basics of heart failure: signs/symptoms and treatments.  Introduces Heart Failure Zone chart for action plan for heart failure.  Written material given at graduation.   Infection Prevention: - Provides verbal and written material to individual with discussion of infection control including proper hand washing and proper equipment cleaning during exercise session. Flowsheet Row Cardiac Rehab from 10/28/2022 in [facility] Cardiac and Pulmonary Rehab  Date 10/28/22  Educator NT  Instruction Review Code 1- Verbalizes Understanding       Falls Prevention: - Provides verbal and written material to individual with discussion of falls prevention and safety. Flowsheet Row Cardiac Rehab from 10/28/2022 in [facility] Cardiac and Pulmonary Rehab  Date 10/28/22  Educator NT  Instruction Review Code 1- Verbalizes Understanding       Other: -Provides group and verbal instruction on various topics (see comments)   Knowledge Questionnaire Score:  Knowledge Questionnaire Score - 10/28/22 1306       Knowledge Questionnaire Score   Pre Score 25/26             Core Components/Risk Factors/Patient Goals at Admission:  Personal Goals and Risk Factors at Admission - 10/23/22 1344       Core Components/Risk Factors/Patient Goals on Admission  Weight Management Weight Loss;Yes    Intervention Weight Management: Develop a combined nutrition and exercise program designed to reach desired caloric intake, while maintaining appropriate intake of nutrient and fiber, sodium and fats, and appropriate energy expenditure required for the weight goal.;Weight Management: Provide education and appropriate resources to help participant work on and attain dietary  goals.;Weight Management/Obesity: Establish reasonable short term and long term weight goals.;Obesity: Provide education and appropriate resources to help participant work on and attain dietary goals.    Expected Outcomes Short Term: Continue to assess and modify interventions until short term weight is achieved;Long Term: Adherence to nutrition and physical activity/exercise program aimed toward attainment of established weight goal;Weight Loss: Understanding of general recommendations for a balanced deficit meal plan, which promotes 1-2 lb weight loss per week and includes a negative energy balance of [phone] kcal/d;Understanding recommendations for meals to include 15-35% energy as protein, 25-35% energy from fat, 35-60% energy from carbohydrates, less than 200mg  of dietary cholesterol, 20-35 gm of total fiber daily;Understanding of distribution of calorie intake throughout the day with the consumption of 4-5 meals/snacks    Hypertension Yes    Intervention Provide education on lifestyle modifcations including regular physical activity/exercise, weight management, moderate sodium restriction and increased consumption of fresh fruit, vegetables, and low fat dairy, alcohol moderation, and smoking cessation.;Monitor prescription use compliance.    Expected Outcomes Short Term: Continued assessment and intervention until BP is < 140/34mm HG in hypertensive participants. < 130/54mm HG in hypertensive participants with diabetes, heart failure or chronic kidney disease.;Long Term: Maintenance of blood pressure at goal levels.    Lipids Yes    Intervention Provide education and support for participant on nutrition & aerobic/resistive exercise along with prescribed medications to achieve LDL 70mg , HDL >40mg .    Expected Outcomes Short Term: Participant states understanding of desired cholesterol values and is compliant with medications prescribed. Participant is following exercise prescription and nutrition  guidelines.;Long Term: Cholesterol controlled with medications as prescribed, with individualized exercise RX and with personalized nutrition plan. Value goals: LDL < 70mg , HDL > 40 mg.             Education:Diabetes - Individual verbal and written instruction to review signs/symptoms of diabetes, desired ranges of glucose level fasting, after meals and with exercise. Acknowledge that pre and post exercise glucose checks will be done for 3 sessions at entry of program.   Core Components/Risk Factors/Patient Goals Review:    Core Components/Risk Factors/Patient Goals at Discharge (Final Review):    ITP Comments:  ITP Comments     Row Name 10/23/22 1353 10/28/22 1013 11/13/22 0842 12/02/22 1742 12/04/22 1132   ITP Comments Virtual Visit completed. Patient informed on EP and RD appointment and 6 Minute walk test. Patient also informed of patient health questionnaires on My Chart. Patient Verbalizes understanding. Visit diagnosis can be found in [facility] 09/09/2022. Completed and gym orientation. Initial ITP created and sent for review to Dr. Bethann Punches, Medical Director. 30 Day review completed. Medical Director ITP review done, changes made as directed, and signed approval by Medical Director.    new to program Called to check on pt. She has not been to rehab since her orientation on 10/14. Per pt's chart, she had heart cath at [facility] 11/14. Attempted to call pt to follow up. No answer at this time. Will try to reach pt at a later time. 30 Day review completed. Medical Director ITP review done, changes made as directed, and signed approval by Medical Director.  remains out, no response to calls            Comments:

## 2022-12-04 NOTE — Telephone Encounter (Signed)
Spoke with Evelyn Simon. She states she would like to be able to complete cardiac rehab, however, she is just "gone all the time" and unable to make it to class. She will be out of town on business next week, then going out of the country 2 weeks later, etc. She wishes to discharge from the program at this time. Adai is aware that if she is able to attend at a later time then she may with a new referral.

## 2022-12-04 NOTE — Progress Notes (Signed)
Discharge Summary:  Evelyn Simon  (DOB: Oct 14, 1955)   Bennetta states she would like to be able to complete cardiac rehab, however, she is just "gone all the time" and unable to make it to class. She will be out of town on business next week, then going out of the country 2 weeks later, etc. She wishes to discharge from the program at this time. Brandalynn is aware that if she is able to attend at a later time then she may with a new referral.   6 Minute Walk     Row Name 10/28/22 1317         6 Minute Walk   Phase Initial     Distance 1125 feet     Walk Time 6 minutes     # of Rest Breaks 0     MPH 2.13     METS 2.54     RPE 11     Perceived Dyspnea  1     VO2 Peak 8.9     Symptoms Yes (comment)     Comments L knee pain 5/10     Resting HR 66 bpm     Resting BP 136/70     Resting Oxygen Saturation  97 %     Exercise Oxygen Saturation  during 6 min walk 95 %     Max Ex. HR 86 bpm     Max Ex. BP 144/72     2 Minute Post BP 140/68

## 2022-12-04 NOTE — Progress Notes (Signed)
Cardiac Individual Treatment Plan  Patient Details  Name: Evelyn Simon MRN: [id number] Date of Birth: [birth year] Referring Provider:   Flowsheet Row Cardiac Rehab from 10/28/2022 in [facility] Cardiac and Pulmonary Rehab  Referring Provider Dr. Adrian Blackwater, MD       Initial Encounter Date:  Flowsheet Row Cardiac Rehab from 10/28/2022 in [facility] Cardiac and Pulmonary Rehab  Date 10/28/22       Visit Diagnosis: NSTEMI (non-ST elevated myocardial infarction) [facility])  Patient's Home Medications on Admission:  Current Outpatient Medications:    albuterol (VENTOLIN HFA) 108 (90 Base) MCG/ACT inhaler, Inhale 2 puffs into the lungs every 6 (six) hours as needed for shortness of breath or wheezing., Disp: , Rfl:    aspirin EC 81 MG tablet, Take 1 tablet (81 mg total) by mouth daily., Disp: 30 tablet, Rfl: 3   aspirin EC 81 MG tablet, Take 1 tablet by mouth daily., Disp: , Rfl:    atorvastatin (LIPITOR) 80 MG tablet, Take 1 tablet by mouth daily., Disp: , Rfl:    azelastine (ASTELIN) 0.1 % nasal spray, Place into both nostrils., Disp: , Rfl:    azelastine (OPTIVAR) 0.05 % ophthalmic solution, Apply to eye., Disp: , Rfl:    Bempedoic Acid-Ezetimibe 180-10 MG TABS, Take 1 tablet by mouth daily., Disp: 30 tablet, Rfl: 3   BIOTIN PO, Take 1 Dose by mouth daily. Liquid, Disp: , Rfl:    bismuth subsalicylate (PEPTO BISMOL) 262 MG chewable tablet, Chew 524 mg by mouth daily as needed for indigestion., Disp: , Rfl:    BRILINTA 90 MG TABS tablet, Take 1 tablet (90 mg total) by mouth 2 (two) times daily., Disp: 60 tablet, Rfl: 1   celecoxib (CELEBREX) 200 MG capsule, Take 200 mg by mouth daily., Disp: , Rfl:    cetirizine (ZYRTEC) 10 MG tablet, Take 1 tablet by mouth daily., Disp: , Rfl:    diphenhydrAMINE (BENADRYL) 12.5 MG/5ML liquid, Take by mouth 4 (four) times daily as needed., Disp: , Rfl:    diphenhydrAMINE (BENADRYL) 12.5 MG/5ML liquid, Take by mouth., Disp: , Rfl:    fexofenadine (ALLEGRA) 180 MG  tablet, Take 180 mg by mouth daily., Disp: , Rfl:    fluticasone (FLONASE) 50 MCG/ACT nasal spray, Place into both nostrils., Disp: , Rfl:    furosemide (LASIX) 20 MG tablet, Take 1 tablet (20 mg total) by mouth daily., Disp: 30 tablet, Rfl: 3   gabapentin (NEURONTIN) 300 MG capsule, Take 300 mg by mouth daily as needed for pain., Disp: , Rfl:    gabapentin (NEURONTIN) 300 MG capsule, Take by mouth., Disp: , Rfl:    levothyroxine (SYNTHROID) 50 MCG tablet, Take 50 mcg by mouth daily., Disp: , Rfl:    lisinopril (ZESTRIL) 2.5 MG tablet, Take 1 tablet (2.5 mg total) by mouth daily., Disp: 30 tablet, Rfl: 11   lisinopril (ZESTRIL) 20 MG tablet, Take by mouth., Disp: , Rfl:    methocarbamol (ROBAXIN) 500 MG tablet, Take 500 mg by mouth daily as needed., Disp: , Rfl:    metoprolol succinate (TOPROL-XL) 50 MG 24 hr tablet, Take 1 tablet by mouth daily., Disp: , Rfl:    metoprolol tartrate (LOPRESSOR) 50 MG tablet, Take 50 mg by mouth 2 (two) times daily., Disp: , Rfl:    montelukast (SINGULAIR) 10 MG tablet, Take 10 mg by mouth daily., Disp: , Rfl:    NON FORMULARY, Take by mouth., Disp: , Rfl:    Polyethyl Glycol-Propyl Glycol (SYSTANE) 0.4-0.3 % SOLN, Place 1 drop into  both eyes daily as needed (dry eyes)., Disp: , Rfl:    Probiotic Product (PROBIOTIC PO), Take 1 capsule by mouth daily., Disp: , Rfl:    ticagrelor (BRILINTA) 90 MG TABS tablet, Take by mouth., Disp: , Rfl:    traMADol (ULTRAM) 50 MG tablet, Take 50 mg by mouth 4 (four) times daily as needed., Disp: , Rfl:   Past Medical History: Past Medical History:  Diagnosis Date   Arthritis    Asthma    Diverticulosis of colon    GERD (gastroesophageal reflux disease)    Headache    H/O MIGRAINES   Hypertension    Hypothyroidism     Tobacco Use: Social History   Tobacco Use  Smoking Status Former   Current packs/day: 0.00   Types: Cigarettes   Quit date: 2003   Years since quitting: 21.9  Smokeless Tobacco Never     Labs: Review Flowsheet        No data to display           Exercise Target Goals: Exercise Program Goal: Individual exercise prescription set using results from initial 6 min walk test and THRR while considering  patient's activity barriers and safety.   Exercise Prescription Goal: Initial exercise prescription builds to 30-45 minutes a day of aerobic activity, 2-3 days per week.  Home exercise guidelines will be given to patient during program as part of exercise prescription that the participant will acknowledge.   Education: Aerobic Exercise: - Group verbal and visual presentation on the components of exercise prescription. Introduces F.I.T.T principle from ACSM for exercise prescriptions.  Reviews F.I.T.T. principles of aerobic exercise including progression. Written material given at graduation. Flowsheet Row Cardiac Rehab from 10/28/2022 in [facility] Cardiac and Pulmonary Rehab  Education need identified 10/28/22       Education: Resistance Exercise: - Group verbal and visual presentation on the components of exercise prescription. Introduces F.I.T.T principle from ACSM for exercise prescriptions  Reviews F.I.T.T. principles of resistance exercise including progression. Written material given at graduation.    Education: Exercise & Equipment Safety: - Individual verbal instruction and demonstration of equipment use and safety with use of the equipment. Flowsheet Row Cardiac Rehab from 10/28/2022 in [facility] Cardiac and Pulmonary Rehab  Date 10/28/22  Educator NT  Instruction Review Code 1- Verbalizes Understanding       Education: Exercise Physiology & General Exercise Guidelines: - Group verbal and written instruction with models to review the exercise physiology of the cardiovascular system and associated critical values. Provides general exercise guidelines with specific guidelines to those with heart or lung disease.    Education: Flexibility, Balance, Mind/Body  Relaxation: - Group verbal and visual presentation with interactive activity on the components of exercise prescription. Introduces F.I.T.T principle from ACSM for exercise prescriptions. Reviews F.I.T.T. principles of flexibility and balance exercise training including progression. Also discusses the mind body connection.  Reviews various relaxation techniques to help reduce and manage stress (i.e. Deep breathing, progressive muscle relaxation, and visualization). Balance handout provided to take home. Written material given at graduation.   Activity Barriers & Risk Stratification:  Activity Barriers & Cardiac Risk Stratification - 10/28/22 1318       Activity Barriers & Cardiac Risk Stratification   Activity Barriers Joint Problems;Other (comment)    Comments Fibromyalgia, foot pain, groin pain, trigger thumb    Cardiac Risk Stratification High             6 Minute Walk:  6 Minute Walk  Row Name 10/28/22 1317         6 Minute Walk   Phase Initial     Distance 1125 feet     Walk Time 6 minutes     # of Rest Breaks 0     MPH 2.13     METS 2.54     RPE 11     Perceived Dyspnea  1     VO2 Peak 8.9     Symptoms Yes (comment)     Comments L knee pain 5/10     Resting HR 66 bpm     Resting BP 136/70     Resting Oxygen Saturation  97 %     Exercise Oxygen Saturation  during 6 min walk 95 %     Max Ex. HR 86 bpm     Max Ex. BP 144/72     2 Minute Post BP 140/68              Oxygen Initial Assessment:   Oxygen Re-Evaluation:   Oxygen Discharge (Final Oxygen Re-Evaluation):   Initial Exercise Prescription:  Initial Exercise Prescription - 10/28/22 1300       Date of Initial Exercise RX and Referring Provider   Date 10/28/22    Referring Provider Dr. Adrian Blackwater, MD      Oxygen   Maintain Oxygen Saturation 88% or higher      Treadmill   MPH 2    Grade 0    Minutes 15    METs 2.53      NuStep   Level 2    SPM 80    Minutes 15    METs 2.54       Biostep-RELP   Level 2    SPM 50    Minutes 15    METs 2.54      Prescription Details   Frequency (times per week) 3    Duration Progress to 30 minutes of continuous aerobic without signs/symptoms of physical distress      Intensity   THRR 40-80% of Max Heartrate 101-136    Ratings of Perceived Exertion 11-13    Perceived Dyspnea 0-4      Progression   Progression Continue to progress workloads to maintain intensity without signs/symptoms of physical distress.      Resistance Training   Training Prescription Yes    Weight 3 lb    Reps 10-15             Perform Capillary Blood Glucose checks as needed.  Exercise Prescription Changes:   Exercise Prescription Changes     Row Name 10/28/22 1300             Response to Exercise   Blood Pressure (Admit) 136/70       Blood Pressure (Exercise) 144/72       Blood Pressure (Exit) 140/68       Heart Rate (Admit) 66 bpm       Heart Rate (Exercise) 86 bpm       Heart Rate (Exit) 68 bpm       Oxygen Saturation (Admit) 97 %       Oxygen Saturation (Exercise) 95 %       Rating of Perceived Exertion (Exercise) 11       Perceived Dyspnea (Exercise) 1       Symptoms L knee pain 5/10       Comments Results  Exercise Comments:   Exercise Goals and Review:   Exercise Goals     Row Name 10/28/22 1319             Exercise Goals   Increase Physical Activity Yes       Intervention Provide advice, education, support and counseling about physical activity/exercise needs.;Develop an individualized exercise prescription for aerobic and resistive training based on initial evaluation findings, risk stratification, comorbidities and participant's personal goals.       Expected Outcomes Long Term: Add in home exercise to make exercise part of routine and to increase amount of physical activity.;Short Term: Attend rehab on a regular basis to increase amount of physical activity.;Long Term: Exercising  regularly at least 3-5 days a week.       Increase Strength and Stamina Yes       Intervention Provide advice, education, support and counseling about physical activity/exercise needs.;Develop an individualized exercise prescription for aerobic and resistive training based on initial evaluation findings, risk stratification, comorbidities and participant's personal goals.       Expected Outcomes Short Term: Increase workloads from initial exercise prescription for resistance, speed, and METs.;Short Term: Perform resistance training exercises routinely during rehab and add in resistance training at home;Long Term: Improve cardiorespiratory fitness, muscular endurance and strength as measured by increased METs and functional capacity ( )       Able to understand and use rate of perceived exertion (RPE) scale Yes       Intervention Provide education and explanation on how to use RPE scale       Expected Outcomes Short Term: Able to use RPE daily in rehab to express subjective intensity level;Long Term:  Able to use RPE to guide intensity level when exercising independently       Able to understand and use Dyspnea scale Yes       Intervention Provide education and explanation on how to use Dyspnea scale       Expected Outcomes Short Term: Able to use Dyspnea scale daily in rehab to express subjective sense of shortness of breath during exertion;Long Term: Able to use Dyspnea scale to guide intensity level when exercising independently       Knowledge and understanding of Target Heart Rate Range (THRR) Yes       Intervention Provide education and explanation of THRR including how the numbers were predicted and where they are located for reference       Expected Outcomes Short Term: Able to state/look up THRR;Long Term: Able to use THRR to govern intensity when exercising independently;Short Term: Able to use daily as guideline for intensity in rehab       Able to check pulse independently Yes        Intervention Provide education and demonstration on how to check pulse in carotid and radial arteries.;Review the importance of being able to check your own pulse for safety during independent exercise       Expected Outcomes Short Term: Able to explain why pulse checking is important during independent exercise;Long Term: Able to check pulse independently and accurately       Understanding of Exercise Prescription Yes       Intervention Provide education, explanation, and written materials on patient's individual exercise prescription       Expected Outcomes Short Term: Able to explain program exercise prescription;Long Term: Able to explain home exercise prescription to exercise independently                Exercise  Goals Re-Evaluation :   Discharge Exercise Prescription (Final Exercise Prescription Changes):  Exercise Prescription Changes - 10/28/22 1300       Response to Exercise   Blood Pressure (Admit) 136/70    Blood Pressure (Exercise) 144/72    Blood Pressure (Exit) 140/68    Heart Rate (Admit) 66 bpm    Heart Rate (Exercise) 86 bpm    Heart Rate (Exit) 68 bpm    Oxygen Saturation (Admit) 97 %    Oxygen Saturation (Exercise) 95 %    Rating of Perceived Exertion (Exercise) 11    Perceived Dyspnea (Exercise) 1    Symptoms L knee pain 5/10    Comments Results             Nutrition:  Target Goals: Understanding of nutrition guidelines, daily intake of sodium 1500mg , cholesterol 200mg , calories 30% from fat and 7% or less from saturated fats, daily to have 5 or more servings of fruits and vegetables.  Education: All About Nutrition: -Group instruction provided by verbal, written material, interactive activities, discussions, models, and posters to present general guidelines for heart healthy nutrition including fat, fiber, MyPlate, the role of sodium in heart healthy nutrition, utilization of the nutrition label, and utilization of this knowledge for meal  planning. Follow up email sent as well. Written material given at graduation.   Biometrics:  Pre Biometrics - 10/28/22 1319       Pre Biometrics   Height 5\' 3"  (1.6 m)    Weight 170 lb 4.8 oz (77.2 kg)    Waist Circumference 36 inches    Hip Circumference 41 inches    Waist to Hip Ratio 0.88 %    BMI (Calculated) 30.18    Single Leg Stand 8.05 seconds              Nutrition Therapy Plan and Nutrition Goals:  Nutrition Therapy & Goals - 10/28/22 1013       Nutrition Therapy   RD appointment deferred Yes      Intervention Plan   Intervention Prescribe, educate and counsel regarding individualized specific dietary modifications aiming towards targeted core components such as weight, hypertension, lipid management, diabetes, heart failure and other comorbidities.    Expected Outcomes Short Term Goal: Understand basic principles of dietary content, such as calories, fat, sodium, cholesterol and nutrients.;Short Term Goal: A plan has been developed with personal nutrition goals set during dietitian appointment.;Long Term Goal: Adherence to prescribed nutrition plan.             Nutrition Assessments:  MEDIFICTS Score Key: >=70 Need to make dietary changes  40-70 Heart Healthy Diet <= 40 Therapeutic Level Cholesterol Diet  Flowsheet Row Cardiac Rehab from 10/28/2022 in [facility] Cardiac and Pulmonary Rehab  Picture Your Plate Total Score on Admission 51      Picture Your Plate Scores: <16 Unhealthy dietary pattern with much room for improvement. 41-50 Dietary pattern unlikely to meet recommendations for good health and room for improvement. 51-60 More healthful dietary pattern, with some room for improvement.  >60 Healthy dietary pattern, although there may be some specific behaviors that could be improved.    Nutrition Goals Re-Evaluation:   Nutrition Goals Discharge (Final Nutrition Goals Re-Evaluation):   Psychosocial: Target Goals: Acknowledge presence or  absence of significant depression and/or stress, maximize coping skills, provide positive support system. Participant is able to verbalize types and ability to use techniques and skills needed for reducing stress and depression.   Education: Stress, Anxiety, and Depression -  Group verbal and visual presentation to define topics covered.  Reviews how body is impacted by stress, anxiety, and depression.  Also discusses healthy ways to reduce stress and to treat/manage anxiety and depression.  Written material given at graduation.   Education: Sleep Hygiene -Provides group verbal and written instruction about how sleep can affect your health.  Define sleep hygiene, discuss sleep cycles and impact of sleep habits. Review good sleep hygiene tips.    Initial Review & Psychosocial Screening:  Initial Psych Review & Screening - 10/23/22 1345       Initial Review   Current issues with Current Sleep Concerns      Family Dynamics   Good Support System? Yes    Comments She has a hard time sleeping alot. Her friend Olegario Messier is a good source for support.      Barriers   Psychosocial barriers to participate in program The patient should benefit from training in stress management and relaxation.      Screening Interventions   Interventions Encouraged to exercise;Provide feedback about the scores to participant;To provide support and resources with identified psychosocial needs    Expected Outcomes Short Term goal: Utilizing psychosocial counselor, staff and physician to assist with identification of specific Stressors or current issues interfering with healing process. Setting desired goal for each stressor or current issue identified.;Long Term Goal: Stressors or current issues are controlled or eliminated.;Short Term goal: Identification and review with participant of any Quality of Life or Depression concerns found by scoring the questionnaire.;Long Term goal: The participant improves quality of Life and  PHQ9 Scores as seen by post scores and/or verbalization of changes             Quality of Life Scores:   Quality of Life - 10/28/22 1304       Quality of Life   Select Quality of Life      Quality of Life Scores   Health/Function Pre 19.7 %    Socioeconomic Pre 23.69 %    Psych/Spiritual Pre 22.07 %    Family Pre 27 %    GLOBAL Pre 21.99 %            Scores of 19 and below usually indicate a poorer quality of life in these areas.  A difference of  2-3 points is a clinically meaningful difference.  A difference of 2-3 points in the total score of the Quality of Life Index has been associated with significant improvement in overall quality of life, self-image, physical symptoms, and general health in studies assessing change in quality of life.  PHQ-9: Review Flowsheet       10/28/2022  Depression screen PHQ 2/9  Decreased Interest 0  Down, Depressed, Hopeless 0  PHQ - 2 Score 0  Altered sleeping 1  Tired, decreased energy 1  Change in appetite 0  Feeling bad or failure about yourself  0  Trouble concentrating 1  Moving slowly or fidgety/restless 0  Suicidal thoughts 0  PHQ-9 Score 3  Difficult doing work/chores Somewhat difficult    Details           Interpretation of Total Score  Total Score Depression Severity:  1-4 = Minimal depression, 5-9 = Mild depression, 10-14 = Moderate depression, 15-19 = Moderately severe depression, 20-27 = Severe depression   Psychosocial Evaluation and Intervention:  Psychosocial Evaluation - 10/23/22 1346       Psychosocial Evaluation & Interventions   Interventions Relaxation education;Stress management education;Encouraged to exercise with the program  and follow exercise prescription    Comments She has a hard time sleeping alot. Her friend Olegario Messier is a good source for support.    Expected Outcomes Short: Start HeartTrack to help with mood. Long: Maintain a healthy mental state    Continue Psychosocial Services  Follow  up required by staff             Psychosocial Re-Evaluation:   Psychosocial Discharge (Final Psychosocial Re-Evaluation):   Vocational Rehabilitation: Provide vocational rehab assistance to qualifying candidates.   Vocational Rehab Evaluation & Intervention:   Education: Education Goals: Education classes will be provided on a variety of topics geared toward better understanding of heart health and risk factor modification. Participant will state understanding/return demonstration of topics presented as noted by education test scores.  Learning Barriers/Preferences:  Learning Barriers/Preferences - 10/23/22 1344       Learning Barriers/Preferences   Learning Barriers None    Learning Preferences None             General Cardiac Education Topics:  AED/CPR: - Group verbal and written instruction with the use of models to demonstrate the basic use of the AED with the basic ABC's of resuscitation.   Anatomy and Cardiac Procedures: - Group verbal and visual presentation and models provide information about basic cardiac anatomy and function. Reviews the testing methods done to diagnose heart disease and the outcomes of the test results. Describes the treatment choices: Medical Management, Angioplasty, or Coronary Bypass Surgery for treating various heart conditions including Myocardial Infarction, Angina, Valve Disease, and Cardiac Arrhythmias.  Written material given at graduation.   Medication Safety: - Group verbal and visual instruction to review commonly prescribed medications for heart and lung disease. Reviews the medication, class of the drug, and side effects. Includes the steps to properly store meds and maintain the prescription regimen.  Written material given at graduation.   Intimacy: - Group verbal instruction through game format to discuss how heart and lung disease can affect sexual intimacy. Written material given at graduation..   Know Your Numbers  and Heart Failure: - Group verbal and visual instruction to discuss disease risk factors for cardiac and pulmonary disease and treatment options.  Reviews associated critical values for Overweight/Obesity, Hypertension, Cholesterol, and Diabetes.  Discusses basics of heart failure: signs/symptoms and treatments.  Introduces Heart Failure Zone chart for action plan for heart failure.  Written material given at graduation.   Infection Prevention: - Provides verbal and written material to individual with discussion of infection control including proper hand washing and proper equipment cleaning during exercise session. Flowsheet Row Cardiac Rehab from 10/28/2022 in [facility] Cardiac and Pulmonary Rehab  Date 10/28/22  Educator NT  Instruction Review Code 1- Verbalizes Understanding       Falls Prevention: - Provides verbal and written material to individual with discussion of falls prevention and safety. Flowsheet Row Cardiac Rehab from 10/28/2022 in [facility] Cardiac and Pulmonary Rehab  Date 10/28/22  Educator NT  Instruction Review Code 1- Verbalizes Understanding       Other: -Provides group and verbal instruction on various topics (see comments)   Knowledge Questionnaire Score:  Knowledge Questionnaire Score - 10/28/22 1306       Knowledge Questionnaire Score   Pre Score 25/26             Core Components/Risk Factors/Patient Goals at Admission:  Personal Goals and Risk Factors at Admission - 10/23/22 1344       Core Components/Risk Factors/Patient Goals on Admission  Weight Management Weight Loss;Yes    Intervention Weight Management: Develop a combined nutrition and exercise program designed to reach desired caloric intake, while maintaining appropriate intake of nutrient and fiber, sodium and fats, and appropriate energy expenditure required for the weight goal.;Weight Management: Provide education and appropriate resources to help participant work on and attain dietary  goals.;Weight Management/Obesity: Establish reasonable short term and long term weight goals.;Obesity: Provide education and appropriate resources to help participant work on and attain dietary goals.    Expected Outcomes Short Term: Continue to assess and modify interventions until short term weight is achieved;Long Term: Adherence to nutrition and physical activity/exercise program aimed toward attainment of established weight goal;Weight Loss: Understanding of general recommendations for a balanced deficit meal plan, which promotes 1-2 lb weight loss per week and includes a negative energy balance of [id number] kcal/d;Understanding recommendations for meals to include 15-35% energy as protein, 25-35% energy from fat, 35-60% energy from carbohydrates, less than 200mg  of dietary cholesterol, 20-35 gm of total fiber daily;Understanding of distribution of calorie intake throughout the day with the consumption of 4-5 meals/snacks    Hypertension Yes    Intervention Provide education on lifestyle modifcations including regular physical activity/exercise, weight management, moderate sodium restriction and increased consumption of fresh fruit, vegetables, and low fat dairy, alcohol moderation, and smoking cessation.;Monitor prescription use compliance.    Expected Outcomes Short Term: Continued assessment and intervention until BP is < 140/60mm HG in hypertensive participants. < 130/10mm HG in hypertensive participants with diabetes, heart failure or chronic kidney disease.;Long Term: Maintenance of blood pressure at goal levels.    Lipids Yes    Intervention Provide education and support for participant on nutrition & aerobic/resistive exercise along with prescribed medications to achieve LDL 70mg , HDL >40mg .    Expected Outcomes Short Term: Participant states understanding of desired cholesterol values and is compliant with medications prescribed. Participant is following exercise prescription and nutrition  guidelines.;Long Term: Cholesterol controlled with medications as prescribed, with individualized exercise RX and with personalized nutrition plan. Value goals: LDL < 70mg , HDL > 40 mg.             Education:Diabetes - Individual verbal and written instruction to review signs/symptoms of diabetes, desired ranges of glucose level fasting, after meals and with exercise. Acknowledge that pre and post exercise glucose checks will be done for 3 sessions at entry of program.   Core Components/Risk Factors/Patient Goals Review:    Core Components/Risk Factors/Patient Goals at Discharge (Final Review):    ITP Comments:  ITP Comments     Row Name 10/23/22 1353 10/28/22 1013 11/13/22 0842 12/02/22 1742 12/04/22 1132   ITP Comments Virtual Visit completed. Patient informed on EP and RD appointment and 6 Minute walk test. Patient also informed of patient health questionnaires on My Chart. Patient Verbalizes understanding. Visit diagnosis can be found in [facility] 09/09/2022. Completed and gym orientation. Initial ITP created and sent for review to Dr. Bethann Punches, Medical Director. 30 Day review completed. Medical Director ITP review done, changes made as directed, and signed approval by Medical Director.    new to program Called to check on pt. She has not been to rehab since her orientation on 10/14. Per pt's chart, she had heart cath at [facility] 11/14. Attempted to call pt to follow up. No answer at this time. Will try to reach pt at a later time. 30 Day review completed. Medical Director ITP review done, changes made as directed, and signed approval by Medical Director.  remains out, no response to calls    Row Name 12/04/22 1406           ITP Comments Kiaria states she would like to be able to complete cardiac rehab, however, she is just "gone all the time" and unable to make it to class. She will be out of town on business next week, then going out of the country 2 weeks later, etc. She wishes to  discharge from the program at this time. Genevive is aware that if she is able to attend at a later time then she may with a new referral.                Comments: Discharge ITP

## 2022-12-05 ENCOUNTER — Ambulatory Visit: Payer: Medicare Other

## 2022-12-09 ENCOUNTER — Ambulatory Visit: Payer: Medicare Other

## 2022-12-10 ENCOUNTER — Ambulatory Visit
Admission: RE | Admit: 2022-12-10 | Discharge: 2022-12-10 | Disposition: A | Discharge status: Home / Self Care | Payer: Medicare Other | Source: Ambulatory Visit | Attending: Podiatry | Admitting: Podiatry

## 2022-12-10 DIAGNOSIS — G5761 Lesion of plantar nerve, right lower limb: Secondary | ICD-10-CM

## 2022-12-10 MED ORDER — GADOPICLENOL 0.5 MMOL/ML IV SOLN
10.0000 mL | Freq: Once | INTRAVENOUS | Status: AC | PRN
Start: 2022-12-10 — End: 2022-12-10
  Administered 2022-12-10: 8 mL via INTRAVENOUS

## 2022-12-11 ENCOUNTER — Ambulatory Visit: Payer: Medicare Other

## 2022-12-16 ENCOUNTER — Ambulatory Visit: Payer: Medicare Other

## 2022-12-16 ENCOUNTER — Ambulatory Visit: Payer: Medicare Other | Admitting: Cardiovascular Disease

## 2022-12-18 ENCOUNTER — Ambulatory Visit: Payer: Medicare Other

## 2022-12-19 ENCOUNTER — Ambulatory Visit: Payer: Medicare Other

## 2022-12-23 ENCOUNTER — Ambulatory Visit: Payer: Medicare Other

## 2022-12-25 ENCOUNTER — Ambulatory Visit: Payer: Medicare Other

## 2022-12-26 ENCOUNTER — Ambulatory Visit: Payer: Medicare Other

## 2022-12-30 ENCOUNTER — Ambulatory Visit: Payer: Medicare Other

## 2022-12-30 ENCOUNTER — Other Ambulatory Visit: Payer: Self-pay | Admitting: Podiatry

## 2022-12-30 ENCOUNTER — Telehealth: Payer: Self-pay

## 2022-12-30 DIAGNOSIS — G5761 Lesion of plantar nerve, right lower limb: Secondary | ICD-10-CM

## 2022-12-30 NOTE — Telephone Encounter (Signed)
-----   Message from Edwin Cap sent at 12/30/2022  8:18 AM EST ----- Can you let her know her MRI was inconclusive for finding any additional neuroma and I've recommended we follow up with an Ultrasound as well? [place] is ordered

## 2022-12-30 NOTE — Telephone Encounter (Signed)
Called patient to let her know her MRI was inconclusive for finding any additional neuroma. Dr. Lilian Kapur has  recommended we follow up with an Ultrasound. [facility] should be calling to schedule. Left detailed message on private voicemail

## 2023-01-01 ENCOUNTER — Ambulatory Visit: Payer: Medicare Other

## 2023-01-02 ENCOUNTER — Ambulatory Visit: Payer: Medicare Other

## 2023-01-06 ENCOUNTER — Ambulatory Visit: Payer: Medicare Other

## 2023-01-09 ENCOUNTER — Ambulatory Visit: Payer: Medicare Other

## 2023-01-13 ENCOUNTER — Ambulatory Visit: Payer: Medicare Other

## 2023-01-16 ENCOUNTER — Ambulatory Visit: Payer: Medicare Other

## 2023-01-20 ENCOUNTER — Ambulatory Visit: Payer: Medicare Other

## 2023-01-22 ENCOUNTER — Ambulatory Visit: Payer: Medicare Other

## 2023-01-23 ENCOUNTER — Ambulatory Visit: Payer: Medicare Other

## 2023-01-27 ENCOUNTER — Ambulatory Visit: Payer: Medicare Other

## 2023-02-04 ENCOUNTER — Other Ambulatory Visit: Payer: Medicare Other

## 2023-02-11 ENCOUNTER — Other Ambulatory Visit: Payer: Self-pay | Admitting: Cardiovascular Disease

## 2023-02-11 DIAGNOSIS — I6523 Occlusion and stenosis of bilateral carotid arteries: Secondary | ICD-10-CM

## 2023-02-11 DIAGNOSIS — I214 Non-ST elevation (NSTEMI) myocardial infarction: Secondary | ICD-10-CM

## 2023-02-11 DIAGNOSIS — I739 Peripheral vascular disease, unspecified: Secondary | ICD-10-CM

## 2023-02-11 DIAGNOSIS — I2510 Atherosclerotic heart disease of native coronary artery without angina pectoris: Secondary | ICD-10-CM

## 2023-02-11 DIAGNOSIS — I10 Essential (primary) hypertension: Secondary | ICD-10-CM

## 2023-02-11 DIAGNOSIS — I70213 Atherosclerosis of native arteries of extremities with intermittent claudication, bilateral legs: Secondary | ICD-10-CM

## 2023-02-25 ENCOUNTER — Ambulatory Visit (HOSPITAL_BASED_OUTPATIENT_CLINIC_OR_DEPARTMENT_OTHER): Payer: Medicare Other | Admitting: Student

## 2023-03-10 ENCOUNTER — Other Ambulatory Visit: Payer: Self-pay | Admitting: Cardiovascular Disease

## 2023-03-22 ENCOUNTER — Other Ambulatory Visit: Payer: Self-pay | Admitting: Cardiovascular Disease

## 2023-03-22 DIAGNOSIS — I2510 Atherosclerotic heart disease of native coronary artery without angina pectoris: Secondary | ICD-10-CM

## 2023-03-22 DIAGNOSIS — I70213 Atherosclerosis of native arteries of extremities with intermittent claudication, bilateral legs: Secondary | ICD-10-CM

## 2023-03-22 DIAGNOSIS — I10 Essential (primary) hypertension: Secondary | ICD-10-CM

## 2023-03-22 DIAGNOSIS — I739 Peripheral vascular disease, unspecified: Secondary | ICD-10-CM

## 2023-03-22 DIAGNOSIS — I6523 Occlusion and stenosis of bilateral carotid arteries: Secondary | ICD-10-CM

## 2023-03-22 DIAGNOSIS — I214 Non-ST elevation (NSTEMI) myocardial infarction: Secondary | ICD-10-CM

## 2023-07-03 ENCOUNTER — Other Ambulatory Visit: Payer: Self-pay | Admitting: Cardiovascular Disease

## 2023-08-11 ENCOUNTER — Encounter: Payer: Self-pay | Admitting: Internal Medicine

## 2023-08-11 ENCOUNTER — Inpatient Hospital Stay: Attending: Internal Medicine | Admitting: Internal Medicine

## 2023-08-11 ENCOUNTER — Inpatient Hospital Stay

## 2023-08-11 VITALS — BP 158/89 | HR 78 | Resp 18 | Ht 62.0 in | Wt 172.3 lb

## 2023-08-11 DIAGNOSIS — Z7982 Long term (current) use of aspirin: Secondary | ICD-10-CM | POA: Diagnosis not present

## 2023-08-11 DIAGNOSIS — D75839 Thrombocytosis, unspecified: Secondary | ICD-10-CM | POA: Insufficient documentation

## 2023-08-11 DIAGNOSIS — Z79899 Other long term (current) drug therapy: Secondary | ICD-10-CM | POA: Insufficient documentation

## 2023-08-11 LAB — CBC WITH DIFFERENTIAL/PLATELET
Abs Immature Granulocytes: 0.02 K/uL (ref 0.00–0.07)
Basophils Absolute: 0.1 K/uL (ref 0.0–0.1)
Basophils Relative: 1 %
Eosinophils Absolute: 0.5 K/uL (ref 0.0–0.5)
Eosinophils Relative: 5 %
HCT: 43.4 % (ref 36.0–46.0)
Hemoglobin: 14.5 g/dL (ref 12.0–15.0)
Immature Granulocytes: 0 %
Lymphocytes Relative: 36 %
Lymphs Abs: 3.1 K/uL (ref 0.7–4.0)
MCH: 33.1 pg (ref 26.0–34.0)
MCHC: 33.4 g/dL (ref 30.0–36.0)
MCV: 99.1 fL (ref 80.0–100.0)
Monocytes Absolute: 0.7 K/uL (ref 0.1–1.0)
Monocytes Relative: 8 %
Neutro Abs: 4.4 K/uL (ref 1.7–7.7)
Neutrophils Relative %: 50 %
Platelets: 413 K/uL — ABNORMAL HIGH (ref 150–400)
RBC: 4.38 MIL/uL (ref 3.87–5.11)
RDW: 12 % (ref 11.5–15.5)
WBC: 8.7 K/uL (ref 4.0–10.5)
nRBC: 0 % (ref 0.0–0.2)

## 2023-08-11 LAB — IRON AND TIBC
Iron: 119 ug/dL (ref 28–170)
Saturation Ratios: 28 % (ref 10.4–31.8)
TIBC: 420 ug/dL (ref 250–450)
UIBC: 301 ug/dL

## 2023-08-11 LAB — LACTATE DEHYDROGENASE: LDH: 123 U/L (ref 98–192)

## 2023-08-11 LAB — COMPREHENSIVE METABOLIC PANEL WITH GFR
ALT: 21 U/L (ref 0–44)
AST: 30 U/L (ref 15–41)
Albumin: 4.7 g/dL (ref 3.5–5.0)
Alkaline Phosphatase: 76 U/L (ref 38–126)
Anion gap: 10 (ref 5–15)
BUN: 14 mg/dL (ref 8–23)
CO2: 26 mmol/L (ref 22–32)
Calcium: 9.5 mg/dL (ref 8.9–10.3)
Chloride: 96 mmol/L — ABNORMAL LOW (ref 98–111)
Creatinine, Ser: 0.72 mg/dL (ref 0.44–1.00)
GFR, Estimated: >60 mL/min (ref >60)
Glucose, Bld: 108 mg/dL — ABNORMAL HIGH (ref 70–99)
Potassium: 3.6 mmol/L (ref 3.5–5.1)
Sodium: 132 mmol/L — ABNORMAL LOW (ref 135–145)
Total Bilirubin: 1 mg/dL (ref 0.0–1.2)
Total Protein: 7.9 g/dL (ref 6.5–8.1)

## 2023-08-11 LAB — FERRITIN: Ferritin: 80 ng/mL (ref 11–307)

## 2023-08-11 NOTE — Assessment & Plan Note (Addendum)
#   Thrombocytosis [450-500 since 2020] -question essential thrombocytosis versus reactive [clinically less likely]. Patient is asymptomatic.   # I had a Long discussion with the patient regarding potential cause of the abnormal blood counts-including reactive [infection inflammation-iatrogenic like splenectomy].  Also possibility of bone marrow disorder like MPN/essential thrombocytosis.   # For now I  recommend checking CBC;CMP jak 2 BCR ABL; MPL; CALR mutation on the peripheral blood.LDH; Also discussed regarding bone marrow biopsy with the above workup is inconclusive; however I would prefer not to do a bone marrow unless absolutely needed.  # I discussed the potential concerns for stroke with elevated platelets. Patient is on aspirin /Brilinta .   # Mild joint pains-?  Autoimmune followed by rheumatology [[place]]- on NSAIDs/ tramadol   # CAD- s/p MRI- on birlloiant- asprin [facility]- cardiology]  Thank you Dr. Jadali for allowing me to participate in the care of your pleasant patient. Please do not hesitate to contact me with questions or concerns in the interim.  # Patient follow-up with me in approximately 2-3 weeks to review the above results. All questions were answered. The patient knows to call the clinic with any problems, questions or concerns.  # DISPOSITION: # labs today- ordered- CBC;CMP jak 2 BCR ABL; MPL; CALR mutation on the peripheral blood.LDH; iron studies and ferritin # follow up in 2-3 weeks- MD; no labs- Dr.B

## 2023-08-11 NOTE — Progress Notes (Signed)
 [facility] CONSULT NOTE  Patient Care Team: Weyman Bright, MD as PCP - General (Internal Medicine) Cindie Jesusa HERO, RN as Registered Nurse Lenon Layman ORN, MD (Internal Medicine) Dessa Reyes ORN, MD (General Surgery) Rennie Cindy SAUNDERS, MD as Consulting Physician (Oncology) Rennie Cindy SAUNDERS, MD as Consulting Physician (Oncology)  CHIEF COMPLAINTS/PURPOSE OF CONSULTATION: Thrombocytosis.   HEMATOLOGY HISTORY  # THROMBOCYTOSIS [platelets- ; Hb; white count]; CT/US -   HHISTORY OF PRESENTING ILLNESS: Patient ambulating-independently.  Alone    Evelyn Simon 68 y.o.  female pleasant patient with a history of joint pains/myalgias and history of CAD on aspirin  Brilinta  is been referred to us  for further evaluation of  elevated platelets which was incidentally found on blood work.   Patient noted to have elevated platelets in the last 4 to 5 years.  In the range of 450-500.   Patient denies any history of blood clots or strokes. Appetite is good . No weight loss or night sweats no recurrent fevers. No cough or shortness of breath or chest pain.   Causes: Medications/steroids- none/splenectomy- none/-back to sleep smoking- quit 2003.   Review of Systems  Constitutional:  Negative for chills, diaphoresis, fever, malaise/fatigue and weight loss.  HENT:  Negative for nosebleeds and sore throat.   Eyes:  Negative for double vision.  Respiratory:  Negative for cough, hemoptysis, sputum production, shortness of breath and wheezing.   Cardiovascular:  Negative for chest pain, palpitations, orthopnea and leg swelling.  Gastrointestinal:  Negative for abdominal pain, blood in stool, constipation, diarrhea, heartburn, melena, nausea and vomiting.  Genitourinary:  Negative for dysuria, frequency and urgency.  Musculoskeletal:  Positive for back pain, joint pain and myalgias.  Skin: Negative.  Negative for itching and rash.  Neurological:  Negative for dizziness,  tingling, focal weakness, weakness and headaches.  Endo/Heme/Allergies:  Does not bruise/bleed easily.  Psychiatric/Behavioral:  Negative for depression. The patient is not nervous/anxious and does not have insomnia.      MEDICAL HISTORY:  Past Medical History:  Diagnosis Date   Arthritis    Asthma    Diverticulosis of colon    GERD (gastroesophageal reflux disease)    Headache    H/O MIGRAINES   Hypertension    Hypothyroidism     SURGICAL HISTORY: Past Surgical History:  Procedure Laterality Date   CESAREAN SECTION     X3   COLONOSCOPY WITH PROPOFOL  N/A 12/21/2015   Procedure: COLONOSCOPY WITH PROPOFOL ;  Surgeon: Ruel Kung, MD;  Location: [facility] ENDOSCOPY;  Service: Endoscopy;  Laterality: N/A;   COLONOSCOPY WITH PROPOFOL  N/A 12/16/2016   Procedure: COLONOSCOPY WITH PROPOFOL ;  Surgeon: Kung Ruel, MD;  Location: [facility] ENDOSCOPY;  Service: Gastroenterology;  Laterality: N/A;   DIAGNOSTIC LAPAROSCOPY     DILATION AND CURETTAGE OF UTERUS     ESOPHAGOGASTRODUODENOSCOPY (EGD) WITH PROPOFOL  N/A 12/21/2015   Procedure: ESOPHAGOGASTRODUODENOSCOPY (EGD) WITH PROPOFOL ;  Surgeon: Ruel Kung, MD;  Location: [facility] ENDOSCOPY;  Service: Endoscopy;  Laterality: N/A;   ESOPHAGOGASTRODUODENOSCOPY (EGD) WITH PROPOFOL  N/A 01/02/2016   Procedure: ESOPHAGOGASTRODUODENOSCOPY (EGD) WITH PROPOFOL ;  Surgeon: Rogelia Copping, MD;  Location: [facility] ENDOSCOPY;  Service: Endoscopy;  Laterality: N/A;   EXCISION MORTON'S NEUROMA Right    FINGER ARTHROPLASTY Left 08/21/2015   Procedure: LEFT THUMB  ARTHROPLASTY (CMC);  Surgeon: Kayla Pinal, MD;  Location: [facility] ORS;  Service: Orthopedics;  Laterality: Left;   LOWER EXTREMITY ANGIOGRAPHY Bilateral 04/25/2020   Procedure: LOWER EXTREMITY ANGIOGRAPHY;  Surgeon: Jama Cordella MATSU, MD;  Location: [facility] INVASIVE CV LAB;  Service:  Cardiovascular;  Laterality: Bilateral;   morton neuromas removal right foot     TUBAL LIGATION     TUMMY TUCK  2014    SOCIAL HISTORY: Social  History   Socioeconomic History   Marital status: Divorced    Spouse name: Not on file   Number of children: Not on file   Years of education: Not on file   Highest education level: Not on file  Occupational History   Not on file  Tobacco Use   Smoking status: Former    Current packs/day: 0.00    Types: Cigarettes    Quit date: 2003    Years since quitting: 22.5   Smokeless tobacco: Never  Vaping Use   Vaping status: Never Used  Substance and Sexual Activity   Alcohol  use: Yes    Alcohol /week: 14.0 standard drinks of alcohol     Types: 14 Glasses of wine per week   Drug use: Yes    Comment: prescribed tramadol    Sexual activity: Never    Birth control/protection: None  Other Topics Concern   Not on file  Social History Narrative   Not on file   Social Drivers of Health   Financial Resource Strain: Low Risk  (07/21/2023)   Received from [facility]   Overall Financial Resource Strain (CARDIA)    Difficulty of Paying Living Expenses: Not hard at all  Food Insecurity: No Food Insecurity (08/11/2023)   Hunger Vital Sign    Worried About Running Out of Food in the Last Year: Never true    Ran Out of Food in the Last Year: Never true  Transportation Needs: No Transportation Needs (08/11/2023)   PRAPARE - Administrator, Civil Service (Medical): No    Lack of Transportation (Non-Medical): No  Physical Activity: Not on file  Stress: Not on file  Social Connections: Unknown (03/06/2022)   [facility]   Social Connections    In the past 3 months, do you feel that you lack companionship or social support?: Not on file  Intimate Partner Violence: Not At Risk (08/11/2023)   Humiliation, Afraid, Rape, and Kick questionnaire    Fear of Current or Ex-Partner: No    Emotionally Abused: No    Physically Abused: No    Sexually Abused: No    FAMILY HISTORY: Family History  Problem Relation Age of Onset   Breast cancer Maternal Aunt     Stroke Mother    Heart disease Mother    Cancer Brother        LUNG   Heart disease Father     ALLERGIES:  is allergic to atrovent hfa [ipratropium bromide hfa], latex, ergotamine-caffeine, other, pineapple, soy allergy (obsolete), amoxicillin , dust mite extract, cinnamon, and ergot alkaloids.  MEDICATIONS:  Current Outpatient Medications  Medication Sig Dispense Refill   albuterol  (VENTOLIN  HFA) 108 (90 Base) MCG/ACT inhaler Inhale 2 puffs into the lungs every 6 (six) hours as needed for shortness of breath or wheezing.     aspirin  EC 81 MG tablet Take 1 tablet (81 mg total) by mouth daily. 30 tablet 3   azelastine (ASTELIN) 0.1 % nasal spray Place into both nostrils.     BRILINTA  90 MG TABS tablet TAKE ONE (1) TABLET BY MOUTH TWO TIMES PER DAY 60 tablet 1   cetirizine (ZYRTEC) 10 MG tablet Take 1 tablet by mouth daily.     diphenhydrAMINE  (BENADRYL ) 12.5 MG/5ML liquid Take by mouth 4 (four) times daily as needed.  diphenhydrAMINE  (BENADRYL ) 12.5 MG/5ML liquid Take by mouth.     fexofenadine (ALLEGRA) 180 MG tablet Take 180 mg by mouth daily.     fluticasone  (FLONASE ) 50 MCG/ACT nasal spray Place into both nostrils.     furosemide  (LASIX ) 40 MG tablet TAKE ONE TABLET BY MOUTH ONCE DAILY CURRENTLY TAKING 60MG  90 tablet 0   gabapentin  (NEURONTIN ) 300 MG capsule Take 300 mg by mouth daily as needed for pain.     gabapentin  (NEURONTIN ) 300 MG capsule Take by mouth.     levothyroxine (SYNTHROID) 50 MCG tablet Take 50 mcg by mouth daily.     lisinopril  (ZESTRIL ) 2.5 MG tablet Take 1 tablet (2.5 mg total) by mouth daily. 30 tablet 11   methocarbamol  (ROBAXIN ) 500 MG tablet Take 500 mg by mouth daily as needed.     montelukast (SINGULAIR) 10 MG tablet Take 10 mg by mouth daily.     Probiotic Product (PROBIOTIC PO) Take 1 capsule by mouth daily.     ticagrelor  (BRILINTA ) 90 MG TABS tablet TAKE 1 TABLET BY MOUTH TWICE DAILY 60 tablet 2   traMADol  (ULTRAM ) 50 MG tablet Take 50 mg by mouth 4  (four) times daily as needed.     atorvastatin  (LIPITOR) 80 MG tablet Take 1 tablet by mouth daily. (Patient not taking: Reported on 08/11/2023)     azelastine (OPTIVAR) 0.05 % ophthalmic solution Apply to eye. (Patient not taking: Reported on 08/11/2023)     Bempedoic Acid -Ezetimibe  180-10 MG TABS Take 1 tablet by mouth daily. (Patient not taking: Reported on 08/11/2023) 30 tablet 3   BIOTIN PO Take 1 Dose by mouth daily. Liquid (Patient not taking: Reported on 08/11/2023)     bismuth  subsalicylate (PEPTO BISMOL) 262 MG chewable tablet Chew 524 mg by mouth daily as needed for indigestion. (Patient not taking: Reported on 08/11/2023)     celecoxib (CELEBREX) 200 MG capsule Take 200 mg by mouth daily. (Patient not taking: Reported on 08/11/2023)     furosemide  (LASIX ) 20 MG tablet Take 1 tablet (20 mg total) by mouth daily. (Patient not taking: Reported on 08/11/2023) 30 tablet 3   lisinopril  (ZESTRIL ) 20 MG tablet Take by mouth. (Patient not taking: Reported on 08/11/2023)     metoprolol  tartrate (LOPRESSOR ) 50 MG tablet Take 50 mg by mouth 2 (two) times daily. (Patient not taking: Reported on 08/11/2023)     NON FORMULARY Take by mouth. (Patient not taking: Reported on 08/11/2023)     Polyethyl Glycol-Propyl Glycol (SYSTANE) 0.4-0.3 % SOLN Place 1 drop into both eyes daily as needed (dry eyes). (Patient not taking: Reported on 08/11/2023)     No current facility-administered medications for this visit.     PHYSICAL EXAMINATION:   Vitals:   08/11/23 1402  BP: (!) 158/89  Pulse: 78  Resp: 18  SpO2: 97%   Filed Weights   08/11/23 1402  Weight: 172 lb 4.8 oz (78.2 kg)    Physical Exam Vitals and nursing note reviewed.  HENT:     Head: Normocephalic and atraumatic.     Mouth/Throat:     Pharynx: Oropharynx is clear.  Eyes:     Extraocular Movements: Extraocular movements intact.     Pupils: Pupils are equal, round, and reactive to light.  Cardiovascular:     Rate and Rhythm: Normal rate and  regular rhythm.  Pulmonary:     Comments: Decreased breath sounds bilaterally.  Abdominal:     Palpations: Abdomen is soft.  Musculoskeletal:  General: Normal range of motion.     Cervical back: Normal range of motion.  Skin:    General: Skin is warm.  Neurological:     General: No focal deficit present.     Mental Status: She is alert and oriented to person, place, and time.  Psychiatric:        Behavior: Behavior normal.        Judgment: Judgment normal.      LABORATORY DATA:  I have reviewed the data as listed Lab Results  Component Value Date   WBC 9.4 09/09/2022   HGB 13.4 09/09/2022   HCT 39.8 09/09/2022   MCV 95.4 09/09/2022   PLT 476 (H) 09/09/2022   Recent Labs    09/09/22 0614  NA 135  K 4.3  CL 101  CO2 23  GLUCOSE 105*  BUN 11  CREATININE 0.75  CALCIUM  9.1  GFRNONAA >60  PROT 7.3  ALBUMIN 4.3  AST 33  ALT 37  ALKPHOS 79  BILITOT 0.9     No results found.  ASSESSMENT & PLAN:   Thrombocytosis # Thrombocytosis [450-500 since 2020] -question essential thrombocytosis versus reactive [clinically less likely]. Patient is asymptomatic.   # I had a Long discussion with the patient regarding potential cause of the abnormal blood counts-including reactive [infection inflammation-iatrogenic like splenectomy].  Also possibility of bone marrow disorder like MPN/essential thrombocytosis.   # For now I  recommend checking CBC;CMP jak 2 BCR ABL; MPL; CALR mutation on the peripheral blood.LDH; Also discussed regarding bone marrow biopsy with the above workup is inconclusive; however I would prefer not to do a bone marrow unless absolutely needed.  # I discussed the potential concerns for stroke with elevated platelets. Patient is on aspirin /Brilinta .   # Mild joint pains-?  Autoimmune followed by rheumatology [[place]]- on NSAIDs/ tramadol   # CAD- s/p MRI- on birlloiant- asprin [facility]- cardiology]  Thank you Dr. Jadali for allowing me to participate  in the care of your pleasant patient. Please do not hesitate to contact me with questions or concerns in the interim.  # Patient follow-up with me in approximately 2-3 weeks to review the above results. All questions were answered. The patient knows to call the clinic with any problems, questions or concerns.  # DISPOSITION: # labs today- ordered- CBC;CMP jak 2 BCR ABL; MPL; CALR mutation on the peripheral blood.LDH; iron studies and ferritin # follow up in 2-3 weeks- MD; no labs- Dr.B         Cindy JONELLE Joe, MD 08/11/2023 2:47 PM

## 2023-08-11 NOTE — Progress Notes (Signed)
 Patient has no concerns

## 2023-08-14 LAB — BCR-ABL1 FISH
Cells Analyzed: 200
Cells Counted: 200

## 2023-08-14 LAB — JAK2 GENOTYPR

## 2023-08-15 LAB — CALRETICULIN (CALR) MUTATION ANALYSIS

## 2023-08-18 LAB — MPL MUTATION ANALYSIS

## 2023-09-02 ENCOUNTER — Inpatient Hospital Stay: Admitting: Internal Medicine

## 2023-09-08 LAB — COLOGUARD

## 2023-09-24 ENCOUNTER — Encounter: Payer: Self-pay | Admitting: Internal Medicine

## 2023-09-24 ENCOUNTER — Inpatient Hospital Stay: Attending: Internal Medicine | Admitting: Internal Medicine

## 2023-09-24 DIAGNOSIS — M1990 Unspecified osteoarthritis, unspecified site: Secondary | ICD-10-CM | POA: Insufficient documentation

## 2023-09-24 DIAGNOSIS — D75839 Thrombocytosis, unspecified: Secondary | ICD-10-CM | POA: Diagnosis not present

## 2023-09-24 DIAGNOSIS — Z7982 Long term (current) use of aspirin: Secondary | ICD-10-CM | POA: Diagnosis not present

## 2023-09-24 DIAGNOSIS — I2510 Atherosclerotic heart disease of native coronary artery without angina pectoris: Secondary | ICD-10-CM | POA: Insufficient documentation

## 2023-09-24 NOTE — Progress Notes (Signed)
 Fatigue: YES Headaches:  NO Joint pain: YES Bleeding from gums/nose: NO  C/o feeling sick to her stomach, nausea this morning. Has had diarrhea and fatigue since starting the enbrel 2 months ago.

## 2023-09-24 NOTE — Progress Notes (Signed)
 [facility] CONSULT NOTE  Patient Care Team: Weyman Bright, MD as PCP - General (Internal Medicine) Cindie Jesusa HERO, RN as Registered Nurse Lenon Layman ORN, MD (Internal Medicine) Dessa Reyes ORN, MD (General Surgery) Rennie Cindy SAUNDERS, MD as Consulting Physician (Oncology)  CHIEF COMPLAINTS/PURPOSE OF CONSULTATION: Thrombocytosis.   HEMATOLOGY HISTORY  # THROMBOCYTOSIS [platelets- ; Hb; white count]; CT/US -   HHISTORY OF PRESENTING ILLNESS: Patient ambulating-independently.  Alone    Evelyn Simon 68 y.o.  female pleasant patient with a history of joint pains/myalgias and history of CAD on aspirin  Brilinta  is here for a follow up for work up of thrombocytosis .  Patient noted to have elevated platelets in the last 4 to 5 years.  In the range of 450-500.   Patient notes to have s ongoing fatigue and myalgias from her underlying arthritis.  She is current on Enbrel.  Review of Systems  Constitutional:  Negative for chills, diaphoresis, fever, malaise/fatigue and weight loss.  HENT:  Negative for nosebleeds and sore throat.   Eyes:  Negative for double vision.  Respiratory:  Negative for cough, hemoptysis, sputum production, shortness of breath and wheezing.   Cardiovascular:  Negative for chest pain, palpitations, orthopnea and leg swelling.  Gastrointestinal:  Negative for abdominal pain, blood in stool, constipation, diarrhea, heartburn, melena, nausea and vomiting.  Genitourinary:  Negative for dysuria, frequency and urgency.  Musculoskeletal:  Positive for back pain, joint pain and myalgias.  Skin: Negative.  Negative for itching and rash.  Neurological:  Negative for dizziness, tingling, focal weakness, weakness and headaches.  Endo/Heme/Allergies:  Does not bruise/bleed easily.  Psychiatric/Behavioral:  Negative for depression. The patient is not nervous/anxious and does not have insomnia.      MEDICAL HISTORY:  Past Medical History:   Diagnosis Date   Arthritis    Asthma    Diverticulosis of colon    GERD (gastroesophageal reflux disease)    Headache    H/O MIGRAINES   Hypertension    Hypothyroidism     SURGICAL HISTORY: Past Surgical History:  Procedure Laterality Date   CESAREAN SECTION     X3   COLONOSCOPY WITH PROPOFOL  N/A 12/21/2015   Procedure: COLONOSCOPY WITH PROPOFOL ;  Surgeon: Ruel Kung, MD;  Location: [facility] ENDOSCOPY;  Service: Endoscopy;  Laterality: N/A;   COLONOSCOPY WITH PROPOFOL  N/A 12/16/2016   Procedure: COLONOSCOPY WITH PROPOFOL ;  Surgeon: Kung Ruel, MD;  Location: [facility] ENDOSCOPY;  Service: Gastroenterology;  Laterality: N/A;   DIAGNOSTIC LAPAROSCOPY     DILATION AND CURETTAGE OF UTERUS     ESOPHAGOGASTRODUODENOSCOPY (EGD) WITH PROPOFOL  N/A 12/21/2015   Procedure: ESOPHAGOGASTRODUODENOSCOPY (EGD) WITH PROPOFOL ;  Surgeon: Ruel Kung, MD;  Location: [facility] ENDOSCOPY;  Service: Endoscopy;  Laterality: N/A;   ESOPHAGOGASTRODUODENOSCOPY (EGD) WITH PROPOFOL  N/A 01/02/2016   Procedure: ESOPHAGOGASTRODUODENOSCOPY (EGD) WITH PROPOFOL ;  Surgeon: Rogelia Copping, MD;  Location: [facility] ENDOSCOPY;  Service: Endoscopy;  Laterality: N/A;   EXCISION MORTON'S NEUROMA Right    FINGER ARTHROPLASTY Left 08/21/2015   Procedure: LEFT THUMB  ARTHROPLASTY (CMC);  Surgeon: Kayla Pinal, MD;  Location: [facility] ORS;  Service: Orthopedics;  Laterality: Left;   LOWER EXTREMITY ANGIOGRAPHY Bilateral 04/25/2020   Procedure: LOWER EXTREMITY ANGIOGRAPHY;  Surgeon: Jama Cordella MATSU, MD;  Location: [facility] INVASIVE CV LAB;  Service: Cardiovascular;  Laterality: Bilateral;   morton neuromas removal right foot     TUBAL LIGATION     TUMMY TUCK  2014    SOCIAL HISTORY: Social History   Socioeconomic History   Marital status:  Divorced    Spouse name: Not on file   Number of children: Not on file   Years of education: Not on file   Highest education level: Not on file  Occupational History   Not on file  Tobacco Use   Smoking  status: Former    Current packs/day: 0.00    Types: Cigarettes    Quit date: 2003    Years since quitting: 22.7   Smokeless tobacco: Never  Vaping Use   Vaping status: Never Used  Substance and Sexual Activity   Alcohol  use: Yes    Alcohol /week: 14.0 standard drinks of alcohol     Types: 14 Glasses of wine per week   Drug use: Yes    Comment: prescribed tramadol    Sexual activity: Never    Birth control/protection: None  Other Topics Concern   Not on file  Social History Narrative   Not on file   Social Drivers of Health   Financial Resource Strain: Low Risk  (07/21/2023)   Received from [facility]   Overall Financial Resource Strain (CARDIA)    Difficulty of Paying Living Expenses: Not hard at all  Food Insecurity: No Food Insecurity (08/11/2023)   Hunger Vital Sign    Worried About Running Out of Food in the Last Year: Never true    Ran Out of Food in the Last Year: Never true  Transportation Needs: No Transportation Needs (08/11/2023)   PRAPARE - Administrator, Civil Service (Medical): No    Lack of Transportation (Non-Medical): No  Physical Activity: Not on file  Stress: Not on file  Social Connections: Unknown (03/06/2022)   Received from [facility]   Social Connections    In the past 3 months, do you feel that you lack companionship or social support?: Not on file  Intimate Partner Violence: Not At Risk (08/11/2023)   Humiliation, Afraid, Rape, and Kick questionnaire    Fear of Current or Ex-Partner: No    Emotionally Abused: No    Physically Abused: No    Sexually Abused: No    FAMILY HISTORY: Family History  Problem Relation Age of Onset   Breast cancer Maternal Aunt    Stroke Mother    Heart disease Mother    Cancer Brother        LUNG   Heart disease Father     ALLERGIES:  is allergic to atrovent hfa [ipratropium bromide hfa], latex, ergotamine-caffeine, other, pineapple, soy allergy (obsolete), amoxicillin , dust mite  extract, cinnamon, and ergot alkaloids.  MEDICATIONS:  Current Outpatient Medications  Medication Sig Dispense Refill   albuterol  (VENTOLIN  HFA) 108 (90 Base) MCG/ACT inhaler Inhale 2 puffs into the lungs every 6 (six) hours as needed for shortness of breath or wheezing.     aspirin  EC 81 MG tablet Take 1 tablet (81 mg total) by mouth daily. 30 tablet 3   azelastine (ASTELIN) 0.1 % nasal spray Place into both nostrils.     azelastine (OPTIVAR) 0.05 % ophthalmic solution Apply to eye.     Bempedoic Acid -Ezetimibe  180-10 MG TABS Take 1 tablet by mouth daily. 30 tablet 3   bismuth  subsalicylate (PEPTO BISMOL) 262 MG chewable tablet Chew 524 mg by mouth daily as needed for indigestion.     cetirizine (ZYRTEC) 10 MG tablet Take 1 tablet by mouth daily.     diphenhydrAMINE  (BENADRYL ) 12.5 MG/5ML liquid Take by mouth 4 (four) times daily as needed.     diphenhydrAMINE  (BENADRYL ) 12.5 MG/5ML liquid  Take by mouth.     ENBREL 50 MG/ML injection Inject 50 mg into the skin once a week.     fexofenadine (ALLEGRA) 180 MG tablet Take 180 mg by mouth daily.     fluticasone  (FLONASE ) 50 MCG/ACT nasal spray Place into both nostrils.     furosemide  (LASIX ) 40 MG tablet TAKE ONE TABLET BY MOUTH ONCE DAILY CURRENTLY TAKING 60MG  90 tablet 0   gabapentin  (NEURONTIN ) 300 MG capsule Take 300 mg by mouth daily as needed for pain.     levothyroxine (SYNTHROID) 50 MCG tablet Take 50 mcg by mouth daily.     lisinopril  (ZESTRIL ) 20 MG tablet Take by mouth. (Patient taking differently: Take 10 mg by mouth daily.)     methocarbamol  (ROBAXIN ) 500 MG tablet Take 500 mg by mouth daily as needed.     montelukast (SINGULAIR) 10 MG tablet Take 10 mg by mouth daily.     Polyethyl Glycol-Propyl Glycol (SYSTANE) 0.4-0.3 % SOLN Place 1 drop into both eyes daily as needed (dry eyes).     Probiotic Product (PROBIOTIC PO) Take 1 capsule by mouth daily.     traMADol  (ULTRAM ) 50 MG tablet Take 50 mg by mouth 4 (four) times daily as  needed.     No current facility-administered medications for this visit.     PHYSICAL EXAMINATION:   Vitals:   09/24/23 0955 09/24/23 1010  BP: (!) 164/87 (!) 156/85  Pulse: 80   Resp: 12   Temp: 98.1 F (36.7 C)   SpO2: 96%    Filed Weights   09/24/23 0955  Weight: 173 lb 9.6 oz (78.7 kg)    Physical Exam Vitals and nursing note reviewed.  HENT:     Head: Normocephalic and atraumatic.     Mouth/Throat:     Pharynx: Oropharynx is clear.  Eyes:     Extraocular Movements: Extraocular movements intact.     Pupils: Pupils are equal, round, and reactive to light.  Cardiovascular:     Rate and Rhythm: Normal rate and regular rhythm.  Pulmonary:     Comments: Decreased breath sounds bilaterally.  Abdominal:     Palpations: Abdomen is soft.  Musculoskeletal:        General: Normal range of motion.     Cervical back: Normal range of motion.  Skin:    General: Skin is warm.  Neurological:     General: No focal deficit present.     Mental Status: She is alert and oriented to person, place, and time.  Psychiatric:        Behavior: Behavior normal.        Judgment: Judgment normal.      LABORATORY DATA:  I have reviewed the data as listed Lab Results  Component Value Date   WBC 8.7 08/11/2023   HGB 14.5 08/11/2023   HCT 43.4 08/11/2023   MCV 99.1 08/11/2023   PLT 413 (H) 08/11/2023   Recent Labs    08/11/23 1451  NA 132*  K 3.6  CL 96*  CO2 26  GLUCOSE 108*  BUN 14  CREATININE 0.72  CALCIUM  9.5  GFRNONAA >60  PROT 7.9  ALBUMIN 4.7  AST 30  ALT 21  ALKPHOS 76  BILITOT 1.0     No results found.  ASSESSMENT & PLAN:   Thrombocytosis # Thrombocytosis [450-500 since 2020]  LIKELY reactive [clinically less likely MPN. Patient is asymptomatic.  jak 2 BCR ABL; MPL; CALR mutation. HOLD off bone marrow as this is not  clinically significant.  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  #Since patient is clinically stable I think is reasonable for the patient to follow-up with PCP/can follow-up with us  as needed.  Patient comfortable with the plan; to call us  if any questions or concerns in the interim.   I would recommend further workup only if platelets are consistently 550-600 or if  other additional lines of blood work abnormal  # DISPOSITION: # follow up as needed MD; no labs- Dr.B          Cindy JONELLE Joe, MD 09/24/2023 10:29 AM

## 2023-09-24 NOTE — Assessment & Plan Note (Addendum)
#   Thrombocytosis [450-500 since 2020]  LIKELY reactive [clinically less likely MPN. Patient is asymptomatic.  jak 2 BCR ABL; MPL; CALR mutation. HOLD off bone marrow as this is not clinically significant.  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  #Since patient is clinically stable I think is reasonable for the patient to follow-up with PCP/can follow-up with us  as needed.  Patient comfortable with the plan; to call us  if any questions or concerns in the interim.   I would recommend further workup only if platelets are consistently 550-600 or if  other additional lines of blood work abnormal  # DISPOSITION: # follow up as needed MD; no labs- Dr.B

## 2023-10-02 LAB — COLOGUARD: COLOGUARD: NEGATIVE

## 2023-10-28 NOTE — Progress Notes (Signed)
 QUICK REFERENCE INFORMATION: CMS.gov Medicare Wellness Visits  Medicare Annual Wellness Visit  Subjective:   Evelyn Simon is a 68 y.o. Female who presents for an Annual Wellness Visit. 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  She has been managing spondyloarthritis with gabapentin , prescribed by her rheumatologist, but finds it insufficient as her pain is not always nerve-related. She has not received any pain medication from her and has not asked for tramadol , although she has used it sparingly in the past, with 15 tablets lasting her three months. She currently has three or four tablets left. She has not had any injections for her arthritis. Her pain primarily affects her thumbs and index fingers.  Her diet typically includes two small pieces of sourdough bread with olive oil for breakfast, and she often skips lunch, occasionally snacking. Dinner is usually homemade from scratch, avoiding processed foods, except for pasta. She has tried low-carb diets in the past, which helped her lose weight, but she gained 20 pounds after stopping. She has not consulted a nutritionist but has done extensive research on her own.  She has memory issues, which her children have noticed, leading them to question if she has dementia. She reports that her neurologist performed a battery of tests for dementia. Her memory has improved since starting Enbrel three months ago for her autoimmune condition. She believes her memory issues may be related to her medications and pain.  She is very active, managing a business and caring for her son, Rolan, who has significant tremors. She also has five puppies and lives on an 11-acre property, which keeps her busy. She travels frequently for volunteer work, including trips to [place].  She experiences  tenderness in her breasts, which sometimes ache into her armpits, but has not noticed any suspicious masses. She experiences hair loss, which she attributes to stress, particularly since her son was diagnosed with cancer again. She has not noticed any bald spots, volume loss or being able to see her scalp through her overlying hair.  She experiences bloating after eating certain foods, which can last for a few days. She has not identified specific triggers but is considering keeping a food log to track potential causes. She also experiences swelling in her knees and ankles, which is managed with extra Lasix  as advised by her cardiologist. HPI  Patient Active Problem List  Diagnosis   Heart palpitations   Coronary artery disease involving native coronary artery of native heart without angina pectoris   Trigger finger of left thumb   Acquired hypothyroidism   Healthcare maintenance   Mastalgia   Weight gain   Asthma with bronchitis (HHS-HCC)   Arthritis of hand   Depressive disorder   Diverticulitis   Diverticulosis of colon   Dupuytren's contracture   Polyosteoarthritis   Symptomatic menopausal or female climacteric states   Traumatic arthropathy   Sprain and strain of cruciate ligament of knee   Asthma (HHS-HCC)   Acquired hypothyroidism   Benign essential hypertension   Trigger finger of left thumb   Heart palpitations   Hyperlipidemia, mixed   Carotid artery disease   Abnormal finding on thyroid  function test   Allergic rhinitis   Anxiety   Atherosclerosis of native arteries of extremity with rest pain (CMS-HCC)   CAD S/P percutaneous coronary angioplasty   Chest discomfort   Chronic pain of both  shoulders   Chronic groin pain   Costochondritis   Disorder of skeletal system   Elbow pain, chronic, unspecified laterality   Esophageal reflux   History of total hip arthroplasty   Interdigital neuroma of left foot   Lump of skin    Menopausal and female climacteric states   Morton's metatarsalgia   Atherosclerosis 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   Arthritis of left knee   HTN (hypertension)   Problems influencing health status   Chronic hip pain after total replacement of left hip joint   History of cesarean section   Osteoarthritis of left hip   Dupuytren's contracture   Allergic rhinitis due to pollen   Arthralgia of knee   Arthralgia of wrist   Chronic allergic conjunctivitis   Chronic obstructive pulmonary disease (CMS/HHS-HCC)   Fibromyositis   Indigestion   Neck pain   Stiffness of left hip joint   Swelling of lower limb   Thrombocytosis   Psychophysiological insomnia   Angina pectoris, unstable (CMS/HHS-HCC)     Outpatient Medications Prior to Visit  Medication Sig Dispense Refill   albuterol  MDI, PROVENTIL , VENTOLIN , PROAIR , HFA 90 mcg/actuation inhaler 2 puffs q.i.d. p.r.n. short of breath, wheezing, or cough 1 each 0   aspirin  81 MG EC tablet Take 81 mg by mouth once daily     azelastine (OPTIVAR) 0.05 % ophthalmic solution Place 1 drop into both eyes 2 (two) times daily     cetirizine (ZYRTEC) 10 MG tablet Take 1 tablet by mouth once daily     cholecalciferol (VITAMIN D3) 5,000 unit capsule Take 5,000 Units by mouth once daily     etanercept (ENBREL) 50 mg/mL (1 mL) inj syringe Inject 1 mL (50 mg total) subcutaneously once a week for 360 days 12 mL 3   fluticasone  propionate (FLONASE ) 50 mcg/actuation nasal spray Place 1 spray into both nostrils once daily as needed for Allergies or Rhinitis     fluticasone -umeclidinium-vilanterol (TRELEGY ELLIPTA) 100-62.5-25 mcg inhaler Inhale 1 Puff into the lungs once daily 60 each 3   FUROsemide  (LASIX ) 20 MG tablet Take 60 mg by mouth once daily     gabapentin  (NEURONTIN ) 300 MG capsule Take 1 capsule (300 mg total) by mouth 3 (three) times daily as needed for up to 30 days 90 capsule 0    isosorbide mononitrate (IMDUR) 30 MG ER tablet Take 1 tablet (30 mg total) by mouth once daily 90 tablet 3   latanoprost (XALATAN) 0.005 % ophthalmic solution Place 1 drop into both eyes at bedtime     levothyroxine (SYNTHROID) 50 MCG tablet Take 50 mcg by mouth once daily     lisinopriL  (ZESTRIL ) 10 MG tablet Take 1 tablet (10 mg total) by mouth once daily     montelukast (SINGULAIR) 10 mg tablet Take 10 mg by mouth     multivitamin tablet Take 1 tablet by mouth once daily     PRALUENT PEN 150 mg/mL PnIj      syringe with needle 3 mL 25 gauge x 1 Syrg To be used with Vit B12 1000 mcg IM once a week for 4 weeks then once a month for 4 months 8 each 0   tiZANidine  (ZANAFLEX ) 4 MG tablet 4 mg by oral route.     traMADoL  (ULTRAM ) 50 mg tablet Take 50 mg by mouth as needed for Pain     traZODone (DESYREL) 50 MG tablet Take 1 tablet (50 mg total) by mouth at bedtime 90 tablet  3   triamcinolone 0.1 % cream Apply topically 2 (two) times daily 30 g 0   albuterol  90 mcg/actuation inhaler Inhale 2 inhalations into the lungs every 6 (six) hours as needed 1 Inhaler 0   No facility-administered medications prior to visit.    Social Drivers of Health with Concerns   Tobacco Use: Medium Risk (10/28/2023)   Patient History    Smoking Tobacco Use: Former    Smokeless Tobacco Use: Never    Passive Exposure: Past   Received from [facility]   Social Connections      07/21/2023  [employer] Authorization for Release of Information - [employer]   Are any of your needs urgent? No  Would you like help with any of the needs that you have identified? No    Family History  Problem Relation Age of Onset   Stroke Mother    Asthma Mother    Asthma Sister    Lung cancer Brother    Breast cancer Maternal Aunt 66   Heart disease Maternal Aunt        cause of death   Brain cancer Son 69   Hepatitis C Sister    COPD Brother    Asthma Father      Past Medical History:  Diagnosis  Date   Acquired hypothyroidism 06/20/2016   Allergic rhinitis due to allergen 1980   Asthma without status asthmaticus (HHS-HCC)    Coronary artery disease involving native coronary artery of native heart without angina pectoris 05/21/2016   Per cardiology   Dermatitis    Diverticulosis    Essential hypertension, benign 04/19/2016   Food intolerance    GERD (gastroesophageal reflux disease)    Hypertension    Sinusitis, unspecified      Current Medical Providers and Suppliers: [facility] Patient Care Team: [place] as PCP - General Future Appointments     Date/Time Provider Department Center Visit Type   11/12/2023 2:00 PM Defoor, Elsie Conch, PA [facility] RHEUM RETURN VISIT      Cardiology, neurology, rheumatology, oncology  Age-appropriate Screening Schedule: The list below includes current immunization status and future screening recommendations based on patient's age. Orders for these recommended tests are listed in the plan section. The patient has been provided with a written plan. Immunization History  Administered Date(s) Administered   PNEUMOCOCCAL (PPSV23)(>=36YRS -OR- >=2 YRS WITH RISK) VACCINE (PNEUMOVAX 23) 10/11/2022   RZV(>=49YR -OR-19+YRS IF  IMMCOMP) VACCINE (SHINGRIX) 10/24/2023   TDAP (>=5YR) VACCINE (ADACEL/BOOSTRIX) 10/28/2014    Health Maintenance Topics with due status: Overdue     Topic Date Due   Medicare Initial AWV [phone] Never done   DXA Bone Density Scan Never done   Mammogram 12/06/2008   Lipid Panel 10/22/2018   Health Maintenance Topics with due status: Postponed     Topic Postponed Until   Influenza Vaccine 07/13/2024 (Originally 09/15/2023)   COVID-19 Vaccine 10/27/2024 (Originally 09/15/2023)   Pneumococcal Vaccine: 50+ 10/27/2024 (Originally 10/11/2023)   RSV Immunization Pregnant or 60+ 10/27/2024 (Originally 09/04/2016)   Health Maintenance Topics with due status: Not Due     Topic  Last Completion Date   Adult Tetanus (Td And Tdap) 10/28/2014   Potassium Level 08/11/2023   Serum Calcium  08/11/2023   Colorectal Cancer Screening 09/16/2023   TSH Level 09/29/2023   Diabetes Screening 09/29/2023   Shingrix 10/24/2023   Depression Screening 10/28/2023   Health Maintenance Topics with due status: Aged Out     Topic Date Due  Hib Vaccines Aged Out   Hepatitis A Vaccines Aged Out   Meningococcal B Vaccine Aged Out   Meningococcal ACWY Vaccine Aged Out   HPV Vaccines Aged Out   Health Maintenance Topics with due status: Discontinued     Topic Date Due   Hepatitis C Screen Discontinued    Depression Screen-PHQ2/9 completed today  PHQ-2 Over the past 2 weeks, how often have you been bothered by any of the following problems? Little interest or pleasure in doing things: (Patient-Rptd) Not at all Feeling down, depressed, or hopeless: (Patient-Rptd) Not at all Patient Health Questionnaire-2 Score: (Patient-Rptd) 0 PHQ-2 Over the last 2 weeks, how often have you been bothered by any of the following problems? Little interest or pleasure in doing things: (Patient-Rptd) Not at all Feeling down, depressed, or hopeless: (Patient-Rptd) Not at all Patient Health Questionnaire-2 Score: (Patient-Rptd) 0  PHQ-9 (if PHQ >=3)    PHQ-2 Interpretation Values between 0-3 are considered not significant for depression  PHQ-9 Interpretation and Treatment Recommendations:  0-4= None  5-9= Mild / Treatment: Support, educate to call if worse; return in one month  10-14= Moderate / Treatment: Support, watchful waiting; Antidepressant or Psychotherapy  15-19= Moderately severe / Treatment: Antidepressant OR Psychotherapy  >= 20 = Major depression, severe / Antidepressant AND Psychotherapy    Patient Health Risk Assessment questionnaire (HRA <redacted file path>): (completed in My [facility] or added in flowsheet)    10/28/2023  HRA  _   HEALTH STATUS   Have you been  hospitalized recently? No  Do you exercise? Yes  How often do you exercise? twice  What type of exercise? walking  How would you rate your diet? Balanced  __   DIFFICULTY WITH   Bathing No  Dressing No  Toileting No  ___   HOME   Rugs in the hallway? No  Grab bars in the bathroom? No*  Stairs inside the home? No  Handrails on the stairs? No*  Poor lighting? No  _____   HEARING   Do you have trouble hearing the television when others do not? No  Do you have to strain to hear conversations? No  ______   ADVANCED DIRECTIVE   Do you have an advance directive? (Living Will and/or Healthcare Power of Attorney) Yes  _______   HRA QUESTIONS   Do you have problems with your memory? Yes*  In the past 4 weeks, was someone available to help you if you were having difficulties? Yes  Are you able to get to the places you need to go by driving or using public transportation? Yes  Do you always wear your seatbelt? Yes  Are you able to do your shopping independently? Yes  Are you able to manage your own finances? Yes  Are you able to manage your own medications (knowing what medications are for and remembering to take them)? Yes  Are you able to use the telephone without difficulty? Yes  Are you able to do your housework and cooking independently? Yes  Do you have problems with your teeth or dentures? No  Do you have problems with sexual function (if applicable)? No  Do you take Opioid medication? Yes*  How would you rate your overall health? Fair    Functional Ability/Safety Screen: Was the patient's timed Get Up and Go Test unsteady or longer than 30 sec? Not performed  How to perform Timed Up and Go test (TUG): [email or web address]  Screening for drug use: How many times in the  past year has patient used an illegal drug or used a prescription medication for non-medical reasons? 0 (>=1 is positive) Has the patient used opioid medication within the last  year? Yes, on chronic opioid medication.  The pain score documented today has been reviewed with the patient? Yes Pain Information (Last Filed)     Score Location Comments Edu?   0-No pain None None None     Reviewed risk factors for opioid use disorder: no identified risk factors Opioids are currently managed by previous orthopedics provider. Reviewed plan for pain management including non-opioid treatment options: Yes  Social History   Tobacco Use   Smoking status: Former    Current packs/day: 0.00    Average packs/day: 1 pack/day for 25.0 years (25.0 ttl pk-yrs)    Types: Cigarettes    Start date: 01/15/1976    Quit date: 01/14/2001    Years since quitting: 22.8    Passive exposure: Past   Smokeless tobacco: Never  Vaping Use   Vaping status: Never Used  Substance Use Topics   Alcohol  use: Yes   Drug use: Never    [email or web address]([id number])@Advance  Care Planning: Patient has executed an Advance Directive: yes  If no, patient was given the opportunity to execute an Advance Directive today? yes  Are the patient's advanced directives in [place]? no This patient has the ability to prepare an Advance Directive: Yes Provider is willing to follow the patient's wishes: Yes  Cognitive Assessment: Cognitive screen used: Clock drawing. Results normal Results: The patient does not have any evidence of any cognitive problems and denies any change in mood/affect, appearance, speech, memory or motor skills.  Identification of Risk Factors: Risk factors include: weight , unhealthy diet, and chronic pain  Review of Systems ROS negative except for pertinent positives noted in the HPI.  Objective:   Vitals:   10/28/23 1407  BP: 98/64  Pulse: 101  Weight: 78.4 kg (172 lb 14.4 oz)  Height: 154.9 cm (5' 1)  PainSc: 0-No pain   Body mass index is 32.67 kg/m. Home vitals:   Physical Exam  Physical Exam Constitutional:      Appearance: Normal appearance.  HENT:     Head:  Normocephalic and atraumatic.     Right Ear: Tympanic membrane, ear canal and external ear normal.     Left Ear: Tympanic membrane, ear canal and external ear normal.     Nose: Nose normal.     Mouth/Throat:     Mouth: Mucous membranes are moist.     Pharynx: Oropharynx is clear.  Eyes:     Extraocular Movements: Extraocular movements intact.     Conjunctiva/sclera: Conjunctivae normal.     Pupils: Pupils are equal, round, and reactive to light.  Cardiovascular:     Rate and Rhythm: Normal rate and regular rhythm.     Heart sounds: Normal heart sounds.  Pulmonary:     Effort: Pulmonary effort is normal.     Breath sounds: Normal breath sounds.  Abdominal:     General: Abdomen is flat. Bowel sounds are normal.     Palpations: Abdomen is soft. There is no mass.     Tenderness: There is no abdominal tenderness.  Musculoskeletal:     Cervical back: Neck supple. No rigidity or tenderness.     Right lower leg: No edema.     Left lower leg: No edema.  Lymphadenopathy:     Cervical: No cervical adenopathy.  Neurological:     General: No focal deficit present.  Mental Status: She is alert and oriented to person, place, and time.     Sensory: No sensory deficit.     Motor: No weakness.  Psychiatric:        Mood and Affect: Mood normal.        Behavior: Behavior normal.     Assessment/Plan:   Patient Self-Management and Personalized Health Advice The patient has been provided with information about: diet, exercise, weight management, and prevention of cardiac or vascular disease  During the course of the visit the patient was educated and counseled about appropriate screening and preventive services including:  COVID vaccine(s) Influenza vaccine Pneumococcal vaccine: 50+ RSV Vaccine--advised to get at pharmacy if Medicare coverage  The patient's BMI is above the acceptable range; discussed or provided materials on diet/exercise Assessment & Plan Spondyloarthritis with hand,  finger, knee, and ankle pain and swelling Chronic spondyloarthritis with inadequate pain control on gabapentin . Limited tramadol  use. No arthritis injections. Inadequate pain management from rheumatologist. - Refer to pain management clinic for further evaluation and management.  Memory issues Short memory loss linked to medication effects, pain, and depression. Improved memory since starting Enbrel. No dementia per neurologist. Depression and anxiety likely contribute to memory issues.  Hair loss Hair loss for six months without bald spots or volume loss. Stress and autoimmune condition potential contributors. Enbrel may reduce inflammation-related hair loss.  Breast tenderness Chronic breast tenderness possibly related to autoimmune condition. No suspicious masses. Pain extends into armpits.  Abdominal bloating Intermittent bloating with unclear trigger. - Advise keeping a food diary to identify dietary triggers. - Suggest carbonated water or zero-calorie soda for symptom relief.  [facility] HRA, providers reviewed. Patient has ACD and will get us  a copy for her medical chart. Screening mammogram ordered. Bone density has already been ordered previously.  Diagnoses and all orders for this visit:  Spondyloarthropathy -     Ambulatory Referral to Pain Clinic  Memory difficulties  Hair loss  Mastalgia  Abdominal bloating  Medicare annual wellness visit, initial  Depression screening (Z13.31) -     Depression Screen -(PHQ- 2/9, BDI)  Encounter for behavioral health screening (Z13.30) -     Anxiety Screen [[id number]]  Encounter for screening mammogram for breast cancer -     Mammo screening breast tomosynthesis bilateral; Future  Other orders -     Follow up in Primary Care; Future     For split visits please select--Optional Coding for video or office visit. Time or MDM for new OR established pts (Optional)--will disappear if none selected: This visit was coded based on  medical decision making (MDM).        Future Appointments     Date/Time Provider Department Center Visit Type   11/12/2023 2:00 PM Defoor, Elsie Conch, PA [facility] RHEUM RETURN VISIT       An after visit summary was provided for the patient either in written format or through My [facility].  This note has been created using automated tools and reviewed for accuracy by JENEAN DRAYTON PATEL.  *Some images could not be shown.

## 2023-11-17 ENCOUNTER — Encounter: Payer: Self-pay | Admitting: Radiology

## 2023-11-26 NOTE — Progress Notes (Signed)
 Chief Complaint  Patient presents with   Psoriatic Arthritis    History of present illness:    Evelyn Simon is a 68 y.o.female who presents today for follow up of psoriatic arthritis sine psoriasis. She was last seen 09/09/2023 and was doing better but poorly, with early evidence of response to enbrel. We continued enbrel.  Evelyn Simon was diagnosed with psoriatic arthritis sine psoriasis in 2025 by Evelyn DeFoor, PA-C. Diagnosis was supported by inflammatory back pain with sacroiliitis on imaging.   Evelyn Simon has tried the following rheumatologic medications in the past: - She has tried and failed 2+ NSAIDs - Enbrel - current since 07/2023  She is doing poorly, noting her back is not awakening her in the middle of the night like it used to but she notes that came in spurts previously as well. She notes her hands, particularly her right hand ,remain very bothersome with difficulty with daily tasks.   Her psoriasis is not identified yet.   Evelyn Simon is currently being treated with enbrel.  She has not experienced side effects from the medication.  She has not had any infections since last office visit.    She has not had any other significant health changes or unexplained new symptoms since last visit.   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 She is a former smoker who smokes 1 ppd for 25 years, for a total of 25 pack years. She quit smoking in 2003. She drinks an average of 14-21 servings of alcohol  weekly. Evelyn Simon a family history of RA in her aunt but denies any family history of known autoimmune disease otherwise.   Review of Systems ROS was negative except as noted above.  Patient Active Problem List   Diagnosis Date Noted   Psychophysiological insomnia 09/29/2023   Angina pectoris, unstable (CMS/HHS-HCC) 09/29/2023   Allergic rhinitis due to pollen 08/28/2023   Chronic allergic conjunctivitis  08/28/2023   Chronic obstructive pulmonary disease (CMS/HHS-HCC) 08/28/2023   Indigestion 08/28/2023   Thrombocytosis 08/11/2023   History of cesarean section 02/10/2023   Osteoarthritis of left hip 02/10/2023   Anxiety 10/08/2022   Morton's metatarsalgia 10/08/2022   Chronic hip pain after total replacement of left hip joint 02/05/2021   Stiffness of left hip joint 02/05/2021   History of total hip arthroplasty 01/09/2021   Chronic pain of both shoulders 04/24/2020   Chronic groin pain 04/24/2020   Elbow pain, chronic, unspecified laterality 04/24/2020   Neck pain 04/24/2020   Disorder of skeletal system 04/23/2020   Problems influencing health status 04/23/2020   Atherosclerosis of native arteries of extremity with rest pain (CMS-HCC) 04/06/2020   Atherosclerosis 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 04/06/2020   HTN (hypertension) 03/05/2020   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   Interdigital neuroma of left foot 11/30/2019   Swelling of lower limb 07/30/2018   Arthritis of left knee 01/21/2018   Carotid artery disease 09/09/2017   Hyperlipidemia, mixed 04/15/2017   Asthma with bronchitis (HHS-HCC) 02/19/2017   Mastalgia 11/15/2016   Weight gain 11/15/2016   Diverticulitis 10/29/2016   Healthcare maintenance 08/15/2016   Acquired hypothyroidism 06/20/2016   Acquired hypothyroidism 06/20/2016   Trigger finger of left thumb 06/17/2016   Trigger finger of left thumb 06/17/2016   Coronary artery disease involving native coronary artery of native heart without angina pectoris 05/21/2016   CAD S/P percutaneous coronary angioplasty 05/21/2016   Heart palpitations 04/19/2016   Benign essential hypertension 04/19/2016  Heart palpitations 04/19/2016   Dupuytren's contracture 11/16/2015   Dupuytren's contracture 11/16/2015   Arthritis of hand 07/25/2015   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   Depressive disorder 08/23/2013    Sprain and strain of cruciate ligament of knee 08/23/2013   Symptomatic menopausal or female climacteric states 07/22/2013   Diverticulosis of colon 07/12/2013   Polyosteoarthritis 07/12/2013   Asthma (HHS-HCC) 07/12/2013   Lump of skin 09/28/2010   Arthralgia of knee 09/28/2010   Arthralgia of wrist 09/28/2010   Abnormal finding on thyroid  function test 08/08/2010   Allergic rhinitis 07/11/2010   Chest discomfort 07/11/2010   Costochondritis 07/11/2010   Esophageal reflux 07/11/2010   Menopausal and female climacteric states 07/11/2010    Past Medical History:  Diagnosis Date   Acquired hypothyroidism 06/20/2016   Allergic rhinitis due to allergen 1980   Asthma without status asthmaticus (HHS-HCC)    Coronary artery disease involving native coronary artery of native heart without angina pectoris 05/21/2016   Per cardiology   Dermatitis    Diverticulosis    Essential hypertension, benign 04/19/2016   Food intolerance    GERD (gastroesophageal reflux disease)    Hypertension    Sinusitis, unspecified    Past Surgical History:  Procedure Laterality Date   ARTHROPLASTY CARPOMETACARPAL/INTERCARPAL JOINTS Left 01/09/2017   Procedure: ARTHROPLASTY, INTERPOSITION, INTERCARPAL OR CARPOMETACARPAL JOINTS;  Surgeon: Harry Alm Shaw, MD;  Location: [facility] OR;  Service: Orthopedics;  Laterality: Left;   INCISION TENDON SHEATH FOR TRIGGER FINGER Left 01/09/2017   Procedure: TENDON SHEATH INCISION (EG, FOR TRIGGER FINGER)-THUMB;  Surgeon: Harry Alm Shaw, MD;  Location: [facility] OR;  Service: Orthopedics;  Laterality: Left;   CESAREAN SECTION     x 3    COLONOSCOPY     Social History   Socioeconomic History   Marital status: Single  Occupational History   Occupation: self-employed  Tobacco Use   Smoking status: Former    Current packs/day: 0.00    Average packs/day: 1 pack/day for 25.0 years (25.0 ttl pk-yrs)    Types: Cigarettes    Start date:  01/15/1976    Quit date: 01/14/2001    Years since quitting: 22.8    Passive exposure: Past   Smokeless tobacco: Never  Vaping Use   Vaping status: Never Used  Substance and Sexual Activity   Alcohol  use: Yes   Drug use: Never   Social Drivers of Corporate Investment Banker Strain: Low Risk  (07/21/2023)   Overall Financial Resource Strain (CARDIA)    Difficulty of Paying Living Expenses: Not hard at all  Food Insecurity: No Food Insecurity (08/11/2023)   Received from [facility]   Hunger Vital Sign    Within the past 12 months, you worried that your food would run out before you got the money to buy more.: Never true    Within the past 12 months, the food you bought just didn't last and you didn't have money to get more.: Never true  Transportation Needs: No Transportation Needs (08/11/2023)   Received from [facility] - Transportation    In the past 12 months, has lack of transportation kept you from medical appointments or from getting medications?: No    In the past 12 months, has lack of transportation kept you from meetings, work, or from getting things needed for daily living?: No   Received from [employer]   Social Connections  Housing Stability: Low Risk  (07/21/2023)   Housing Stability Vital Sign    Unable  to Pay for Housing in the Last Year: No    Number of Times Moved in the Last Year: 1    Homeless in the Last Year: No   Family History  Problem Relation Name Age of Onset   Stroke Mother Camellia    Asthma Mother Camellia    Asthma Sister Vickie    Lung cancer Brother Abby    Breast cancer Maternal Wylie Gunner 53   Heart disease Maternal Aunt Gunner        cause of death   Brain cancer Son Toribio 32   Hepatitis C Sister Nathanel    COPD Brother Warren    Asthma Father Vinie     Physical Exam: BP 138/85   Pulse 72   Temp 36.3 C (97.4 F)   Ht 157.5 cm (5' 2)   Wt 79.4 kg (175 lb)   LMP 05/03/2001 (Approximate)   BMI 32.01  kg/m   General: Pleasant older white woman sitting in chair. Well groomed, no acute distress. Non-toxic appearance.  HEENT: Conjunctivae normal.  Pulmonary: Normal effort of breathing. Symmetric chest expansion.  Musculoskeletal: Tenderness to palpation of right 2nd 3rd MCP and 2nd PIP, wrists, brachioradiali, SI joints, quadriceps tendons but not patellar tendons, and achilles tendons. Synovitis noted to S1 to right 2nd MCP PIP. Fist formation mildly impaired bilaterally. Positive squeeze tests to hands and feet. No other tender, synovitic, warm, erythematous, or deformed joints. FROM all joints except as above.  Skin: Skin warm and dry. No rashes appreciable. Neurologic: Oriented to time, person, place, and situation.  Psychological: Normal behavior, thought content, and judgment. Records were reviewed Lab Results  Component Value Date/Time   SEDRATE 9 04/15/2023 04:40 PM   Lab Results  Component Value Date/Time   GLUCOSE 95 10/21/2017 08:10 AM   GLUCOSE 91 08/10/2007 10:03 AM   BUN 11 10/21/2017 08:10 AM   CREATININE 0.7 04/15/2023 04:40 PM   CREATININE 0.8 08/10/2007 10:03 AM   NA 138 10/21/2017 08:10 AM   NA 140 08/10/2007 10:03 AM   K 4.3 10/21/2017 08:10 AM   K 4.7 08/10/2007 10:03 AM   CL 99 10/21/2017 08:10 AM   CL 105 08/10/2007 10:03 AM   CO2 29.4 10/21/2017 08:10 AM   CALCIUM  9.5 10/21/2017 08:10 AM   CALCIUM  9.2 08/10/2007 10:03 AM   ALB 5.1 (H) 04/15/2023 04:40 PM   ALB 4.4 12/22/2007 01:52 PM   AGRATIO 1.2 05/20/2016 10:05 AM   ALKPHOS 79 04/15/2023 04:40 PM   ALKPHOS 85 12/22/2007 01:52 PM   AST 35 04/15/2023 04:40 PM   AST 29 12/22/2007 01:52 PM   ALT 34 04/15/2023 04:40 PM   ALT 20 12/22/2007 01:52 PM   Lab Results  Component Value Date/Time   WBC 9.4 04/15/2023 04:40 PM   WBC 6.6 08/10/2007 10:04 AM   RBC 4.00 (L) 04/15/2023 04:40 PM   RBC 4.30 08/10/2007 10:04 AM   HGB 12.9 04/15/2023 04:40 PM   HGB 13.4 08/10/2007 10:04 AM   HCT 38.6 04/15/2023  04:40 PM   HCT 0.40 08/10/2007 10:04 AM   MCV 96.5 04/15/2023 04:40 PM   MCH 32.3 (H) 04/15/2023 04:40 PM   MCHC 33.4 04/15/2023 04:40 PM   PLT 375 04/15/2023 04:40 PM   PLT 488 (H) 08/10/2007 10:04 AM    Assessment and Plan: Psoriatic arthritis (CMS/HHS-HCC)  (primary encounter diagnosis) Plan: CBC w/auto Differential (5 Part), Hepatic        Function Panel (HFP), Creatinine, C-Reactive  Protein, Quant - [facility], Sedimentation        Rate-Westergren - [facility], Viral Hepatitis HBV,       HCV - [facility], QuantiFERON-TB Gold Plus -        [facility]  Encounter for long-term (current) use of high-risk medication  1. Psoriatic arthritis-Moderate to severe.  Improved and Poorly controlled.  While her inflammatory back pain appears to have improved, overall disease control from Enbrel is lackluster. We discussed potential remicade vs bimzelx and after discussion she opts for bimzelx.   Risks, benefits, and alternatives to treatment with Bimzelx discussed with patient.  Discussed side effects including headache, nausea, site reactions, increased risk of infections, increased risk of inflammatory bowel disease, and increased risk of certain malignancies. Evelyn test for Hepatitis B, Hepatitis C, and TB before initiating treatment.  Patient was counseled to hold the medication when febrile or with signs/symptoms of infection.   2. High risk prescription-This patient is on drug therapy requiring intensive monitoring for toxicity, including regular lab monitoring and screening for serious or recurrent infections.  Today, I assessed for side effects, infections, new or worsening health conditions that may be related to the medications, and Evelyn obtain labs.  Return in about 4 months (around 03/31/2024).    Medication List      * Accurate as of December 02, 2023  9:12 AM. If you have any questions, ask your nurse or doctor.        CONTINUE taking these medications   * albuterol  MDI (PROVENTIL ,  VENTOLIN , PROAIR ) HFA 90 mcg/actuation inhaler Inhale 2 inhalations into the lungs every 6 (six) hours as needed   * albuterol  MDI (PROVENTIL , VENTOLIN , PROAIR ) HFA 90 mcg/actuation inhaler 2 puffs q.i.d. p.r.n. short of breath, wheezing, or cough   aspirin  81 MG EC tablet   azelastine 0.05 % ophthalmic solution Commonly known as: OPTIVAR   cetirizine 10 MG tablet Commonly known as: ZyrTEC   cholecalciferol 5,000 unit capsule Commonly known as: VITAMIN D3   etanercept 50 mg/mL (1 mL) inj syringe Commonly known as: ENBREL Inject 1 mL (50 mg total) subcutaneously once a week for 360 days   fluticasone  propionate 50 mcg/actuation nasal spray Commonly known as: FLONASE    FUROsemide  20 MG tablet Commonly known as: LASIX    gabapentin  300 MG capsule Commonly known as: NEURONTIN  Take 1 capsule (300 mg total) by mouth 3 (three) times daily as needed for up to 30 days   isosorbide mononitrate 30 MG ER tablet Commonly known as: IMDUR Take 1 tablet (30 mg total) by mouth once daily   latanoprost 0.005 % ophthalmic solution Commonly known as: XALATAN   levothyroxine 50 MCG tablet Commonly known as: SYNTHROID Take 1 tablet (50 mcg total) by mouth once daily   lisinopriL  10 MG tablet Commonly known as: ZESTRIL  Take 1 tablet (10 mg total) by mouth once daily   montelukast 10 mg tablet Commonly known as: SINGULAIR   multivitamin tablet   PRALUENT PEN 150 mg/mL Pnij Generic drug: alirocumab   syringe with needle 3 mL 25 gauge x 1 Syrg To be used with Vit B12 1000 mcg IM once a week for 4 weeks then once a month for 4 months   tiZANidine  4 MG tablet Commonly known as: ZANAFLEX    traMADoL  50 mg tablet Commonly known as: ULTRAM    traZODone 50 MG tablet Commonly known as: DESYREL Take 1 tablet (50 mg total) by mouth at bedtime   TRELEGY ELLIPTA 100-62.5-25 mcg inhaler Generic drug: fluticasone -umeclidinium-vilanterol Inhale 1  Puff into the lungs once daily    triamcinolone 0.1 % cream Apply topically 2 (two) times daily     * * This list has 2 medication(s) that are the same as other medications prescribed for you. Read the directions carefully, and ask your doctor or other care provider to review them with you.         Orders Placed This Encounter  Procedures   CBC w/auto Differential (5 Part)   Hepatic Function Panel (HFP)   Creatinine   C-Reactive Protein, Quant - [facility]   Sedimentation Rate-Westergren - [facility]   Viral Hepatitis HBV, HCV - [facility]   QuantiFERON-TB Gold Plus - [facility]    All new prescription medications, changes in current prescription dosages, and sample medications were discussed with the patient, including patient education, medication name, use, dosage, potential side effects, drug interactions, consequences of not using/taking, and special instructions.  Patient expressed understanding.  No barriers to adherence. *Some images could not be shown.

## 2023-12-25 NOTE — Telephone Encounter (Signed)
 Refill request received for patient.    Medication Requested: lisinopril  Last Office Visit: 04/23/23 Next Office Visit: Visit date not found Last Prescriber: lewis  Nurse refill requirements met? No If not met, why: patient cancelled last two aptmts fu cavender per last visit noteShe is currently on lisinopril  5 mg which she has been taking every other day due to low blood pressures when taken daily.  I asked her to record blood pressures and bring to her next clinic visit  Sent to: Provider for signing If sent to provider, which provider?: lewis

## 2023-12-31 ENCOUNTER — Emergency Department

## 2023-12-31 ENCOUNTER — Emergency Department
Admission: EM | Admit: 2023-12-31 | Discharge: 2023-12-31 | Disposition: A | Discharge status: Home / Self Care | Source: Ambulatory Visit | Attending: Emergency Medicine | Admitting: Emergency Medicine

## 2023-12-31 DIAGNOSIS — R809 Proteinuria, unspecified: Secondary | ICD-10-CM | POA: Diagnosis not present

## 2023-12-31 DIAGNOSIS — K760 Fatty (change of) liver, not elsewhere classified: Secondary | ICD-10-CM | POA: Insufficient documentation

## 2023-12-31 DIAGNOSIS — K5790 Diverticulosis of intestine, part unspecified, without perforation or abscess without bleeding: Secondary | ICD-10-CM

## 2023-12-31 DIAGNOSIS — R7401 Elevation of levels of liver transaminase levels: Secondary | ICD-10-CM | POA: Diagnosis not present

## 2023-12-31 DIAGNOSIS — K5730 Diverticulosis of large intestine without perforation or abscess without bleeding: Secondary | ICD-10-CM | POA: Diagnosis not present

## 2023-12-31 DIAGNOSIS — J45909 Unspecified asthma, uncomplicated: Secondary | ICD-10-CM | POA: Insufficient documentation

## 2023-12-31 DIAGNOSIS — I10 Essential (primary) hypertension: Secondary | ICD-10-CM | POA: Insufficient documentation

## 2023-12-31 DIAGNOSIS — R1032 Left lower quadrant pain: Secondary | ICD-10-CM | POA: Diagnosis present

## 2023-12-31 DIAGNOSIS — E039 Hypothyroidism, unspecified: Secondary | ICD-10-CM | POA: Insufficient documentation

## 2023-12-31 LAB — CBC
HCT: 40.9 % (ref 36.0–46.0)
Hemoglobin: 13.5 g/dL (ref 12.0–15.0)
MCH: 34.4 pg — ABNORMAL HIGH (ref 26.0–34.0)
MCHC: 33 g/dL (ref 30.0–36.0)
MCV: 104.3 fL — ABNORMAL HIGH (ref 80.0–100.0)
Platelets: 423 K/uL — ABNORMAL HIGH (ref 150–400)
RBC: 3.92 MIL/uL (ref 3.87–5.11)
RDW: 11.9 % (ref 11.5–15.5)
WBC: 9.2 K/uL (ref 4.0–10.5)
nRBC: 0 % (ref 0.0–0.2)

## 2023-12-31 LAB — COMPREHENSIVE METABOLIC PANEL WITH GFR
ALT: 29 U/L (ref 0–44)
AST: 43 U/L — ABNORMAL HIGH (ref 15–41)
Albumin: 4.7 g/dL (ref 3.5–5.0)
Alkaline Phosphatase: 93 U/L (ref 38–126)
Anion gap: 13 (ref 5–15)
BUN: 11 mg/dL (ref 8–23)
CO2: 27 mmol/L (ref 22–32)
Calcium: 9.3 mg/dL (ref 8.9–10.3)
Chloride: 93 mmol/L — ABNORMAL LOW (ref 98–111)
Creatinine, Ser: 0.66 mg/dL (ref 0.44–1.00)
GFR, Estimated: >60 mL/min (ref >60)
Glucose, Bld: 120 mg/dL — ABNORMAL HIGH (ref 70–99)
Potassium: 3.9 mmol/L (ref 3.5–5.1)
Sodium: 134 mmol/L — ABNORMAL LOW (ref 135–145)
Total Bilirubin: 0.5 mg/dL (ref 0.0–1.2)
Total Protein: 7.4 g/dL (ref 6.5–8.1)

## 2023-12-31 LAB — URINALYSIS, ROUTINE W REFLEX MICROSCOPIC
Bilirubin Urine: NEGATIVE
Glucose, UA: NEGATIVE mg/dL
Hgb urine dipstick: NEGATIVE
Ketones, ur: 20 mg/dL — AB
Leukocytes,Ua: NEGATIVE
Nitrite: NEGATIVE
Protein, ur: 100 mg/dL — AB
Specific Gravity, Urine: 1.023 (ref 1.005–1.030)
pH: 8 (ref 5.0–8.0)

## 2023-12-31 LAB — LIPASE, BLOOD: Lipase: 27 U/L (ref 11–51)

## 2023-12-31 MED ORDER — SODIUM CHLORIDE 0.9 % IV BOLUS
1000.0000 mL | Freq: Once | INTRAVENOUS | Status: AC
  Administered 2023-12-31: 15:00:00 1000 mL via INTRAVENOUS

## 2023-12-31 MED ORDER — ONDANSETRON HCL 4 MG/2ML IJ SOLN
4.0000 mg | Freq: Once | INTRAMUSCULAR | Status: AC
  Administered 2023-12-31: 15:00:00 4 mg via INTRAVENOUS
  Filled 2023-12-31: qty 2

## 2023-12-31 MED ORDER — IOHEXOL 300 MG/ML  SOLN
100.0000 mL | Freq: Once | INTRAMUSCULAR | Status: AC | PRN
  Administered 2023-12-31: 15:00:00 100 mL via INTRAVENOUS

## 2023-12-31 NOTE — ED Provider Notes (Signed)
 [facility] Provider Note    Event Date/Time   First MD Initiated Contact with Patient 12/31/23 1355     (approximate)   History   Abdominal Pain   HPI  Katyra Tomassetti is a 68 y.o. female  with a past medical history of asthma, GERD, hypothyroidism, HTN, diverticulitis, chronic pain syndrome presents to the emergency department with nausea, intermittent bloating, and left lower quadrant abdominal pain that has been occurring intermittently for the past 2 weeks. She denies back pain, vomiting, diarrhea, fever, chest pain, shortness of breath, dysuria, increased urinary frequency.  She reports she does not eat a fiber rich diet at home.  Has hx of tubal ligation.  No history of abdominal surgeries.  Physical Exam   Triage Vital Signs: ED Triage Vitals [12/31/23 1223]  Encounter Vitals Group     BP (!) 185/82     Girls Systolic BP Percentile      Girls Diastolic BP Percentile      Boys Systolic BP Percentile      Boys Diastolic BP Percentile      Pulse Rate 68     Resp 18     Temp 97.6 F (36.4 C)     Temp Source Oral     SpO2 96 %     Weight 169 lb (76.7 kg)     Height 5' 2 (1.575 m)     Head Circumference      Peak Flow      Pain Score 8     Pain Loc      Pain Education      Exclude from Growth Chart     Most recent vital signs: Vitals:   12/31/23 1223 12/31/23 1700  BP: (!) 185/82 (!) 158/72  Pulse: 68 77  Resp: 18 17  Temp: 97.6 F (36.4 C)   SpO2: 96% 96%    General: Awake, in no acute distress. Appears stated age. Afebrile. Neck: Supple. CV: Good peripheral perfusion.  Respiratory:Normal respiratory effort.  No respiratory distress.  GI: Mildly distended with TTP in LLQ. No guarding. Skin:Warm, dry, intact.  Neurological: A&Ox4 to person, place, time, and situation.   ED Results / Procedures / Treatments   Labs (all labs ordered are listed, but only abnormal results are displayed) Labs Reviewed  COMPREHENSIVE METABOLIC  PANEL WITH GFR - Abnormal; Notable for the following components:      Result Value   Sodium 134 (*)    Chloride 93 (*)    Glucose, Bld 120 (*)    AST 43 (*)    All other components within normal limits  CBC - Abnormal; Notable for the following components:   MCV 104.3 (*)    MCH 34.4 (*)    Platelets 423 (*)    All other components within normal limits  URINALYSIS, ROUTINE W REFLEX MICROSCOPIC - Abnormal; Notable for the following components:   Color, Urine YELLOW (*)    APPearance CLOUDY (*)    Ketones, ur 20 (*)    Protein, ur 100 (*)    Bacteria, UA RARE (*)    All other components within normal limits  LIPASE, BLOOD     EKG     RADIOLOGY CT abdomen pelvis IMPRESSION: 1. No acute intra-abdominal or pelvic abnormality. 2. Total colonic diverticulosis. No changes of acute diverticulitis. 3. Diffuse hepatic steatosis.   PROCEDURES:  Critical Care performed: No   Procedures   MEDICATIONS ORDERED IN ED: Medications  sodium chloride  0.9 %  bolus 1,000 mL (0 mLs Intravenous Stopped 12/31/23 1655)  ondansetron  (ZOFRAN ) injection 4 mg (4 mg Intravenous Given 12/31/23 1514)  iohexol  (OMNIPAQUE ) 300 MG/ML solution 100 mL (100 mLs Intravenous Contrast Given 12/31/23 1459)     IMPRESSION / MDM / ASSESSMENT AND PLAN / ED COURSE  I reviewed the triage vital signs and the nursing notes.                              Differential diagnosis includes, but is not limited to, diverticulosis, diverticulitis, constipation, hepatic steatosis, UTI, dehydration  Patient's presentation is most consistent with acute complicated illness / injury requiring diagnostic workup.  Patient is a 68 year old female presenting with nausea, intermittent bloating and left lower quadrant abdominal pain for the past 2 weeks.  She is generally well-appearing on exam and afebrile but she does have some mild abdominal distention and some tenderness to palpation in the left lower quadrant, therefore CT  abdomen pelvis was ordered.  Lipase unremarkable.  CMP with mildly decreased sodium at 134, chloride at 93, and elevated AST at 43.  CBC with elevated platelet count at 423, this seems consistent with her baseline with values of 413-551 within the past 3 years; normal WBC count of 9.2.  UA with proteinuria, ketones and rare bacteria but no white blood cells, nitrites or leukocytes.  She was given fluids, Zofran  in the emergency department.  She was offered pain medication but denied at this time.  The patient is not having any urinary symptoms at this time.  CT shows diffuse hepatic steatosis and total colonic diverticulosis without evidence of diverticulitis.  Discussed these findings with her and provided information packets. Will have her follow-up with her primary care provider regarding today's labs and hepatic steatosis finding.  Will have her follow-up with GI for diverticulosis finding on CT.  Encouraged increased fiber and fluid intake at home as well as OTC pain medications as needed.  The patient may return to the emergency department for any new, worsening, or concerning symptoms. Patient was given the opportunity to ask questions; all questions were answered. Emergency department return precautions were discussed with the patient.  Patient is in agreement to the treatment plan.  Patient is stable for discharge.    FINAL CLINICAL IMPRESSION(S) / ED DIAGNOSES   Final diagnoses:  Diverticulosis  Hepatic steatosis     Rx / DC Orders   ED Discharge Orders     None        Note:  This document was prepared using Dragon voice recognition software and may include unintentional dictation errors.     Sheron Salm, PA-C 12/31/23 1812    Dorothyann Drivers, MD 01/01/24 2214

## 2023-12-31 NOTE — ED Triage Notes (Signed)
 Pt presents to the ED with abdominal pain, nausea, and bloating intermittently for two weeks. Pt reports hx of diverticulitis

## 2023-12-31 NOTE — ED Triage Notes (Signed)
 First nurse note: pt to ED [facility] for bloating, abd pain, nausea. Hx diverticulitis.

## 2023-12-31 NOTE — Discharge Instructions (Addendum)
 You were seen in the emergency department for diverticulosis and fatty liver disease as we discussed. Read through the attached instructions for more information. Follow up with your primary care provider following today's visit as well as the gastroenterologist (Dr. Jinny) listed. Eat a fiber-rich diet. Return to the ER if you experience worsening abdominal pain, bloating, vomiting, fever, or any other new, worsening or concerning symptoms. You may use Tylenol  at home as needed for pain based on the instructions on the bottle unless a doctor has told you not to take this medication.

## 2024-01-21 ENCOUNTER — Encounter: Payer: Self-pay | Admitting: Intensive Care

## 2024-01-21 ENCOUNTER — Other Ambulatory Visit: Payer: Self-pay

## 2024-01-21 ENCOUNTER — Emergency Department

## 2024-01-21 ENCOUNTER — Emergency Department
Admission: EM | Admit: 2024-01-21 | Discharge: 2024-01-21 | Disposition: A | Discharge status: Home / Self Care | Attending: Emergency Medicine | Admitting: Emergency Medicine

## 2024-01-21 DIAGNOSIS — I10 Essential (primary) hypertension: Secondary | ICD-10-CM | POA: Insufficient documentation

## 2024-01-21 DIAGNOSIS — R079 Chest pain, unspecified: Secondary | ICD-10-CM | POA: Insufficient documentation

## 2024-01-21 DIAGNOSIS — J4489 Other specified chronic obstructive pulmonary disease: Secondary | ICD-10-CM | POA: Diagnosis not present

## 2024-01-21 HISTORY — DX: Chronic obstructive pulmonary disease, unspecified: J44.9

## 2024-01-21 LAB — TROPONIN T, HIGH SENSITIVITY
Troponin T High Sensitivity: 19 ng/L (ref 0–19)
Troponin T High Sensitivity: 19 ng/L (ref 0–19)

## 2024-01-21 LAB — PROTIME-INR
INR: 1 (ref 0.8–1.2)
Prothrombin Time: 13.3 s (ref 11.4–15.2)

## 2024-01-21 LAB — CBC
HCT: 41.5 % (ref 36.0–46.0)
Hemoglobin: 13.8 g/dL (ref 12.0–15.0)
MCH: 34.1 pg — ABNORMAL HIGH (ref 26.0–34.0)
MCHC: 33.3 g/dL (ref 30.0–36.0)
MCV: 102.5 fL — ABNORMAL HIGH (ref 80.0–100.0)
Platelets: 382 K/uL (ref 150–400)
RBC: 4.05 MIL/uL (ref 3.87–5.11)
RDW: 11.7 % (ref 11.5–15.5)
WBC: 8 K/uL (ref 4.0–10.5)
nRBC: 0 % (ref 0.0–0.2)

## 2024-01-21 LAB — BASIC METABOLIC PANEL WITH GFR
Anion gap: 12 (ref 5–15)
BUN: 14 mg/dL (ref 8–23)
CO2: 26 mmol/L (ref 22–32)
Calcium: 9.5 mg/dL (ref 8.9–10.3)
Chloride: 98 mmol/L (ref 98–111)
Creatinine, Ser: 0.64 mg/dL (ref 0.44–1.00)
GFR, Estimated: >60 mL/min
Glucose, Bld: 103 mg/dL — ABNORMAL HIGH (ref 70–99)
Potassium: 4 mmol/L (ref 3.5–5.1)
Sodium: 136 mmol/L (ref 135–145)

## 2024-01-21 NOTE — Discharge Instructions (Signed)
 Please follow-up with your cardiologist regarding today's ER visit.  Please return to the emergency department should the chest pain return you develop any shortness of breath, or any other symptom personally concerning to yourself.

## 2024-01-21 NOTE — ED Provider Notes (Signed)
 "  [facility] Provider Note    Event Date/Time   First MD Initiated Contact with Patient 01/21/24 1350     (approximate)  History   Chief Complaint: Chest Pain  HPI  Evelyn Simon is a 69 y.o. female with a past medical history of asthma, arthritis, COPD, gastric reflux, hypertension, prior MI with cardiac stents, presents to the emergency department for chest pain.  According to the patient around 11 AM this morning she developed a central significant chest pain.  Patient states she took a nitroglycerin  tablet but it still took about 5 minutes or so for the pain to resolve.  Patient has been chest pain-free ever since however given her history of MI and cardiac stents she was concerned so she came to the emergency department for evaluation.  Denies any shortness of breath.  No pleuritic pain now or at any point.  Physical Exam   Triage Vital Signs: ED Triage Vitals  Encounter Vitals Group     BP 01/21/24 1148 (!) 193/85     Girls Systolic BP Percentile --      Girls Diastolic BP Percentile --      Boys Systolic BP Percentile --      Boys Diastolic BP Percentile --      Pulse Rate 01/21/24 1148 86     Resp 01/21/24 1148 16     Temp 01/21/24 1148 97.6 F (36.4 C)     Temp Source 01/21/24 1148 Oral     SpO2 01/21/24 1148 96 %     Weight 01/21/24 1145 170 lb (77.1 kg)     Height 01/21/24 1145 5' 2 (1.575 m)     Head Circumference --      Peak Flow --      Pain Score 01/21/24 1145 5     Pain Loc --      Pain Education --      Exclude from Growth Chart --     Most recent vital signs: Vitals:   01/21/24 1148  BP: (!) 193/85  Pulse: 86  Resp: 16  Temp: 97.6 F (36.4 C)  SpO2: 96%    General: Awake, no distress.  CV:  Good peripheral perfusion.  Regular rate and rhythm  Resp:  Normal effort.  Equal breath sounds bilaterally.  Abd:  No distention.  Soft, nontender.   ED Results / Procedures / Treatments   EKG  EKG viewed and interpreted by  myself shows a normal sinus rhythm at 80 bpm with a narrow QRS, left axis deviation, largely normal intervals with no concerning ST changes.  RADIOLOGY  I have reviewed and interpreted the chest x-ray images.  No consolidation seen on my evaluation. Radiology has read the x-ray as negative for acute process.   MEDICATIONS ORDERED IN ED: Medications - No data to display   IMPRESSION / MDM / ASSESSMENT AND PLAN / ED COURSE  I reviewed the triage vital signs and the nursing notes.  Patient's presentation is most consistent with acute presentation with potential threat to life or bodily function.  Patient presents emergency department for chest pain lasting approximately 5 minutes earlier today starting around 11 AM.  Patient did take a nitroglycerin  tablet.  Patient denies any symptoms currently.  No pleuritic pain at any point.  No shortness of breath.  Denies any recent fever cough or congestion.  Currently the patient appears well with a reassuring physical exam.  EKG is reassuring and chest x-ray appears clear.  Patient's  lab work shows a reassuring CBC reassuring chemistry and a negative troponin.  However given the patient's significant cardiac history we will repeat a troponin after at least 2 hours.  Patient agreeable to plan of care and workup.  If the patient's repeat troponin remains negative anticipate discharge home patient will follow-up with her cardiologist.    FINAL CLINICAL IMPRESSION(S) / ED DIAGNOSES   Chest pain   Note:  This document was prepared using Dragon voice recognition software and may include unintentional dictation errors.   Dorothyann Drivers, MD 01/21/24 1445  "

## 2024-01-21 NOTE — ED Triage Notes (Signed)
 Patient presents with pain in upper right back, right chest and right side of jaw. Reports it had her doubled over.   Reports taking nitroglycerin  at home with some relief   History MI  A&O x4 in triage
